# Patient Record
Sex: Female | Born: 1950 | Race: White | Hispanic: No | Marital: Married | Smoking: Former smoker
Health system: Southern US, Community
[De-identification: ages and names within clinical notes are randomized; demographics above are authoritative.]

## PROBLEM LIST (undated history)

## (undated) DIAGNOSIS — M797 Fibromyalgia: Secondary | ICD-10-CM

## (undated) DIAGNOSIS — E785 Hyperlipidemia, unspecified: Secondary | ICD-10-CM

## (undated) DIAGNOSIS — I10 Essential (primary) hypertension: Secondary | ICD-10-CM

## (undated) DIAGNOSIS — I493 Ventricular premature depolarization: Secondary | ICD-10-CM

## (undated) DIAGNOSIS — K219 Gastro-esophageal reflux disease without esophagitis: Secondary | ICD-10-CM

## (undated) DIAGNOSIS — T7840XA Allergy, unspecified, initial encounter: Secondary | ICD-10-CM

## (undated) HISTORY — DX: Allergy, unspecified, initial encounter: T78.40XA

## (undated) HISTORY — PX: ABDOMINAL HYSTERECTOMY: SHX81

## (undated) HISTORY — DX: Hyperlipidemia, unspecified: E78.5

## (undated) HISTORY — DX: Ventricular premature depolarization: I49.3

## (undated) HISTORY — DX: Fibromyalgia: M79.7

## (undated) HISTORY — DX: Essential (primary) hypertension: I10

## (undated) HISTORY — PX: CATARACT EXTRACTION, BILATERAL: SHX1313

## (undated) HISTORY — DX: Gastro-esophageal reflux disease without esophagitis: K21.9

---

## 1983-01-17 HISTORY — PX: APPENDECTOMY: SHX54

## 2004-02-26 ENCOUNTER — Ambulatory Visit: Payer: Self-pay | Admitting: Family Medicine

## 2004-07-12 ENCOUNTER — Ambulatory Visit: Payer: Self-pay | Admitting: Family Medicine

## 2004-07-29 ENCOUNTER — Ambulatory Visit: Payer: Self-pay | Admitting: Family Medicine

## 2005-01-05 ENCOUNTER — Ambulatory Visit: Payer: Self-pay | Admitting: Family Medicine

## 2005-12-15 ENCOUNTER — Ambulatory Visit: Payer: Self-pay | Admitting: Family Medicine

## 2006-03-16 ENCOUNTER — Ambulatory Visit: Payer: Self-pay | Admitting: Family Medicine

## 2006-05-04 ENCOUNTER — Ambulatory Visit: Payer: Self-pay | Admitting: Family Medicine

## 2008-01-17 HISTORY — PX: CHOLECYSTECTOMY: SHX55

## 2015-05-06 DIAGNOSIS — E538 Deficiency of other specified B group vitamins: Secondary | ICD-10-CM | POA: Insufficient documentation

## 2018-02-22 DIAGNOSIS — I10 Essential (primary) hypertension: Secondary | ICD-10-CM | POA: Insufficient documentation

## 2018-02-22 DIAGNOSIS — R079 Chest pain, unspecified: Secondary | ICD-10-CM | POA: Diagnosis not present

## 2018-02-22 DIAGNOSIS — E782 Mixed hyperlipidemia: Secondary | ICD-10-CM | POA: Diagnosis not present

## 2018-02-22 DIAGNOSIS — I493 Ventricular premature depolarization: Secondary | ICD-10-CM | POA: Diagnosis not present

## 2018-02-22 DIAGNOSIS — R002 Palpitations: Secondary | ICD-10-CM | POA: Diagnosis not present

## 2018-02-25 DIAGNOSIS — R002 Palpitations: Secondary | ICD-10-CM | POA: Diagnosis not present

## 2018-02-25 DIAGNOSIS — R079 Chest pain, unspecified: Secondary | ICD-10-CM | POA: Diagnosis not present

## 2018-03-01 ENCOUNTER — Encounter: Payer: Self-pay | Admitting: Physician Assistant

## 2018-03-01 ENCOUNTER — Telehealth: Payer: Self-pay | Admitting: Physician Assistant

## 2018-03-01 ENCOUNTER — Ambulatory Visit (INDEPENDENT_AMBULATORY_CARE_PROVIDER_SITE_OTHER): Payer: Medicare Other | Admitting: Physician Assistant

## 2018-03-01 VITALS — BP 130/83 | HR 71 | Temp 97.2°F | Ht 59.0 in | Wt 153.6 lb

## 2018-03-01 DIAGNOSIS — E782 Mixed hyperlipidemia: Secondary | ICD-10-CM

## 2018-03-01 DIAGNOSIS — E538 Deficiency of other specified B group vitamins: Secondary | ICD-10-CM | POA: Diagnosis not present

## 2018-03-01 DIAGNOSIS — I493 Ventricular premature depolarization: Secondary | ICD-10-CM

## 2018-03-01 DIAGNOSIS — Z0000 Encounter for general adult medical examination without abnormal findings: Secondary | ICD-10-CM

## 2018-03-01 DIAGNOSIS — I10 Essential (primary) hypertension: Secondary | ICD-10-CM | POA: Diagnosis not present

## 2018-03-01 DIAGNOSIS — F419 Anxiety disorder, unspecified: Secondary | ICD-10-CM

## 2018-03-01 DIAGNOSIS — T7840XS Allergy, unspecified, sequela: Secondary | ICD-10-CM

## 2018-03-01 DIAGNOSIS — T7840XA Allergy, unspecified, initial encounter: Secondary | ICD-10-CM | POA: Insufficient documentation

## 2018-03-01 DIAGNOSIS — K219 Gastro-esophageal reflux disease without esophagitis: Secondary | ICD-10-CM

## 2018-03-01 MED ORDER — MONTELUKAST SODIUM 10 MG PO TABS: 10.0000 mg | ORAL_TABLET | Freq: Every day | ORAL | 3 refills | Status: DC

## 2018-03-01 MED ORDER — OMEPRAZOLE 40 MG PO CPDR: 40.0000 mg | DELAYED_RELEASE_CAPSULE | Freq: Every day | ORAL | 3 refills | Status: DC

## 2018-03-01 MED ORDER — FLUTICASONE PROPIONATE 50 MCG/ACT NA SUSP: 2.0000 | Freq: Every day | NASAL | 3 refills | Status: DC

## 2018-03-01 MED ORDER — ATORVASTATIN CALCIUM 20 MG PO TABS: 20.0000 mg | ORAL_TABLET | Freq: Every day | ORAL | 3 refills | Status: DC

## 2018-03-01 MED ORDER — CETIRIZINE HCL 10 MG PO TABS: 10.0000 mg | ORAL_TABLET | Freq: Every day | ORAL | 3 refills | Status: DC

## 2018-03-01 MED ORDER — ESTRADIOL 0.5 MG PO TABS: 0.5000 mg | ORAL_TABLET | Freq: Every day | ORAL | 0 refills | Status: DC

## 2018-03-01 NOTE — Telephone Encounter (Signed)
PT has called back and said that her next apt with the cardiologist is on 03/20/2018 1:45 with Nicole Aguirre with [facility] in [facility].

## 2018-03-02 LAB — CMP14+EGFR
ALT: 11 IU/L (ref 0–32)
AST: 15 IU/L (ref 0–40)
Albumin/Globulin Ratio: 1.4 (ref 1.2–2.2)
Albumin: 4.2 g/dL (ref 3.8–4.8)
Alkaline Phosphatase: 62 IU/L (ref 39–117)
BUN/Creatinine Ratio: 14 (ref 12–28)
BUN: 12 mg/dL (ref 8–27)
Bilirubin Total: 0.3 mg/dL (ref 0.0–1.2)
CO2: 24 mmol/L (ref 20–29)
Calcium: 9.5 mg/dL (ref 8.7–10.3)
Chloride: 102 mmol/L (ref 96–106)
Creatinine, Ser: 0.83 mg/dL (ref 0.57–1.00)
GFR calc Af Amer: 85 mL/min/{1.73_m2} (ref >59)
GFR calc non Af Amer: 74 mL/min/{1.73_m2} (ref >59)
Globulin, Total: 3.1 g/dL (ref 1.5–4.5)
Glucose: 78 mg/dL (ref 65–99)
Potassium: 4.3 mmol/L (ref 3.5–5.2)
SODIUM: 140 mmol/L (ref 134–144)
Total Protein: 7.3 g/dL (ref 6.0–8.5)

## 2018-03-02 LAB — LIPID PANEL
Chol/HDL Ratio: 2.3 ratio (ref 0.0–4.4)
Cholesterol, Total: 185 mg/dL (ref 100–199)
HDL: 79 mg/dL (ref >39)
LDL Calculated: 96 mg/dL (ref 0–99)
TRIGLYCERIDES: 50 mg/dL (ref 0–149)
VLDL Cholesterol Cal: 10 mg/dL (ref 5–40)

## 2018-03-02 LAB — TSH: TSH: 2.02 u[IU]/mL (ref 0.450–4.500)

## 2018-03-03 DIAGNOSIS — I493 Ventricular premature depolarization: Secondary | ICD-10-CM | POA: Insufficient documentation

## 2018-03-03 NOTE — Progress Notes (Signed)
BP 130/83   Pulse 71   Temp (!) 97.2 F (36.2 C) (Oral)   Ht _0  (1.499 m)   Wt 153 lb 9.6 oz (69.7 kg)   BMI 31.02 kg/m    Subjective:    Patient ID: Nicole Aguirre, female    DOB: 09-26-1950, 68 y.o.   MRN: [id number]  HPI: Nicole Aguirre is a 68 y.o. female presenting on 03/01/2018 for New Patient (Initial Visit) and Establish Care  This patient comes in to be established at out practice and medications and conditions including hypertension, GERD, allergies, B12 deficiency, elevated cholesterol, anxiety.  All medications are reviewed and will be refilled. She has had some weak spells and has had low pulse rate. She does have an upcoming appointment with Dr. Carlis Abbott with [facility] in [place]. Recent testing has been good.  She does need up coming female exam, DEXA and mammogram.  All medications are reviewed today. There are no reports of any problems with the medications. All of the medical conditions are reviewed and updated.  Lab work is reviewed and will be ordered as medically necessary. There are no new problems reported with today's visit.   Past Medical History:  Diagnosis Date  . Allergy   . Fibromyalgia   . GERD (gastroesophageal reflux disease)   . Hyperlipidemia   . Hypertension   . PVC (premature ventricular contraction)    Relevant past medical, surgical, family and social history reviewed and updated as indicated. Interim medical history since our last visit reviewed. Allergies and medications reviewed and updated. DATA REVIEWED: CHART IN EPIC  Family History reviewed for pertinent findings.  Review of Systems  Constitutional: Negative.  Negative for activity change, fatigue and fever.  HENT: Positive for congestion.   Eyes: Negative.   Respiratory: Negative.  Negative for cough.   Cardiovascular: Positive for palpitations. Negative for chest pain.  Gastrointestinal: Negative.  Negative for abdominal pain.  Endocrine: Negative.   Genitourinary:  Negative.  Negative for dysuria.  Musculoskeletal: Negative.   Skin: Negative.   Neurological: Positive for weakness.    Allergies as of 03/01/2018   No Known Allergies     Medication List       Accurate as of March 01, 2018 11:59 PM. Always use your most recent med list.        amLODipine 5 MG tablet Commonly known as:  NORVASC Take by mouth.   atorvastatin 20 MG tablet Commonly known as:  LIPITOR Take 1 tablet (20 mg total) by mouth at bedtime.   cetirizine 10 MG tablet Commonly known as:  ZYRTEC Take 1 tablet (10 mg total) by mouth daily.   estradiol 0.5 MG tablet Commonly known as:  ESTRACE Take 1 tablet (0.5 mg total) by mouth daily.   fluticasone 50 MCG/ACT nasal spray Commonly known as:  FLONASE Place 2 sprays into both nostrils daily.   gabapentin 300 MG capsule Commonly known as:  NEURONTIN Take 300 mg by mouth 2 (two) times daily.   hydrochlorothiazide 25 MG tablet Commonly known as:  HYDRODIURIL 12.5 mg.   montelukast 10 MG tablet Commonly known as:  SINGULAIR Take 1 tablet (10 mg total) by mouth daily.   omeprazole 40 MG capsule Commonly known as:  PRILOSEC Take 1 capsule (40 mg total) by mouth daily.          Objective:    BP 130/83   Pulse 71   Temp (!) 97.2 F (36.2 C) (Oral)   Ht _1  (1.499  m)   Wt 153 lb 9.6 oz (69.7 kg)   BMI 31.02 kg/m   No Known Allergies  Wt Readings from Last 3 Encounters:  03/01/18 153 lb 9.6 oz (69.7 kg)    Physical Exam Constitutional:      Appearance: She is well-developed.  HENT:     Head: Normocephalic and atraumatic.  Eyes:     Conjunctiva/sclera: Conjunctivae normal.     Pupils: Pupils are equal, round, and reactive to light.  Cardiovascular:     Rate and Rhythm: Normal rate and regular rhythm.     Heart sounds: Normal heart sounds.  Pulmonary:     Effort: Pulmonary effort is normal.     Breath sounds: Normal breath sounds.  Abdominal:     General: Bowel sounds are normal.      Palpations: Abdomen is soft.  Skin:    General: Skin is warm and dry.     Findings: No rash.  Neurological:     Mental Status: She is alert and oriented to person, place, and time.     Deep Tendon Reflexes: Reflexes are normal and symmetric.  Psychiatric:        Behavior: Behavior normal.        Thought Content: Thought content normal.        Judgment: Judgment normal.     Results for orders placed or performed in visit on 03/01/18  CMP14+EGFR  Result Value Ref Range   Glucose 78 65 - 99 mg/dL   BUN 12 8 - 27 mg/dL   Creatinine, Ser 0.83 0.57 - 1.00 mg/dL   GFR calc non Af Amer 74 >59 mL/min/1.73   GFR calc Af Amer 85 >59 mL/min/1.73   BUN/Creatinine Ratio 14 12 - 28   Sodium 140 134 - 144 mmol/L   Potassium 4.3 3.5 - 5.2 mmol/L   Chloride 102 96 - 106 mmol/L   CO2 24 20 - 29 mmol/L   Calcium 9.5 8.7 - 10.3 mg/dL   Total Protein 7.3 6.0 - 8.5 g/dL   Albumin 4.2 3.8 - 4.8 g/dL   Globulin, Total 3.1 1.5 - 4.5 g/dL   Albumin/Globulin Ratio 1.4 1.2 - 2.2   Bilirubin Total 0.3 0.0 - 1.2 mg/dL   Alkaline Phosphatase 62 39 - 117 IU/L   AST 15 0 - 40 IU/L   ALT 11 0 - 32 IU/L  Lipid panel  Result Value Ref Range   Cholesterol, Total 185 100 - 199 mg/dL   Triglycerides 50 0 - 149 mg/dL   HDL 79 >39 mg/dL   VLDL Cholesterol Cal 10 5 - 40 mg/dL   LDL Calculated 96 0 - 99 mg/dL   Chol/HDL Ratio 2.3 0.0 - 4.4 ratio  TSH  Result Value Ref Range   TSH 2.020 0.450 - 4.500 uIU/mL      Assessment & Plan:   1. Well adult exam - CMP14+EGFR - Lipid panel - TSH  2. Essential hypertension - hydrochlorothiazide (HYDRODIURIL) 25 MG tablet; 12.5 mg. - amLODipine (NORVASC) 5 MG tablet; Take by mouth. - CMP14+EGFR - Lipid panel - TSH  3. Gastroesophageal reflux disease without esophagitis - omeprazole (PRILOSEC) 40 MG capsule; Take 1 capsule (40 mg total) by mouth daily.  Dispense: 90 capsule; Refill: 3  4. Mixed hyperlipidemia - atorvastatin (LIPITOR) 20 MG tablet; Take 1  tablet (20 mg total) by mouth at bedtime.  Dispense: 90 tablet; Refill: 3  5. B12 deficiency Continue medications  6. Anxiety Continue medications  7. Allergic  state, sequela - cetirizine (ZYRTEC) 10 MG tablet; Take 1 tablet (10 mg total) by mouth daily.  Dispense: 90 tablet; Refill: 3 - fluticasone (FLONASE) 50 MCG/ACT nasal spray; Place 2 sprays into both nostrils daily.  Dispense: 48 g; Refill: 3 - montelukast (SINGULAIR) 10 MG tablet; Take 1 tablet (10 mg total) by mouth daily.  Dispense: 90 tablet; Refill: 3   Continue all other maintenance medications as listed above.  Follow up plan: Return for CPE female exam in next few month.  Educational handout given for [place] PA-C [facility] [address]  [place], [place] [address] [phone]   03/03/2018, 10:56 PM

## 2018-03-04 ENCOUNTER — Other Ambulatory Visit: Payer: Self-pay | Admitting: Physician Assistant

## 2018-03-04 DIAGNOSIS — I10 Essential (primary) hypertension: Secondary | ICD-10-CM

## 2018-03-04 DIAGNOSIS — K219 Gastro-esophageal reflux disease without esophagitis: Secondary | ICD-10-CM

## 2018-03-04 DIAGNOSIS — E782 Mixed hyperlipidemia: Secondary | ICD-10-CM

## 2018-03-04 DIAGNOSIS — T7840XS Allergy, unspecified, sequela: Secondary | ICD-10-CM

## 2018-03-04 MED ORDER — AMLODIPINE BESYLATE 5 MG PO TABS: 5.0000 mg | ORAL_TABLET | Freq: Every day | ORAL | 3 refills | Status: DC

## 2018-03-04 MED ORDER — GABAPENTIN 300 MG PO CAPS: 300.0000 mg | ORAL_CAPSULE | Freq: Two times a day (BID) | ORAL | 3 refills | Status: DC

## 2018-03-04 MED ORDER — HYDROCHLOROTHIAZIDE 25 MG PO TABS: 12.5000 mg | ORAL_TABLET | Freq: Every day | ORAL | 3 refills | Status: DC

## 2018-03-04 MED ORDER — FLUTICASONE PROPIONATE 50 MCG/ACT NA SUSP: 2.0000 | Freq: Every day | NASAL | 3 refills | Status: DC

## 2018-03-04 MED ORDER — OMEPRAZOLE 40 MG PO CPDR: 40.0000 mg | DELAYED_RELEASE_CAPSULE | Freq: Every day | ORAL | 3 refills | Status: DC

## 2018-03-04 MED ORDER — CETIRIZINE HCL 10 MG PO TABS: 10.0000 mg | ORAL_TABLET | Freq: Every day | ORAL | 3 refills | Status: DC

## 2018-03-04 MED ORDER — ESTRADIOL 0.5 MG PO TABS: 0.5000 mg | ORAL_TABLET | Freq: Every day | ORAL | 3 refills | Status: DC

## 2018-03-04 MED ORDER — MONTELUKAST SODIUM 10 MG PO TABS: 10.0000 mg | ORAL_TABLET | Freq: Every day | ORAL | 3 refills | Status: DC

## 2018-03-04 MED ORDER — ATORVASTATIN CALCIUM 20 MG PO TABS: 20.0000 mg | ORAL_TABLET | Freq: Every day | ORAL | 3 refills | Status: DC

## 2018-03-04 NOTE — Telephone Encounter (Signed)
Pt aware - re-sent to mail

## 2018-04-02 ENCOUNTER — Ambulatory Visit (INDEPENDENT_AMBULATORY_CARE_PROVIDER_SITE_OTHER): Payer: Medicare Other | Admitting: Family

## 2018-04-02 ENCOUNTER — Telehealth: Payer: Self-pay | Admitting: Physician Assistant

## 2018-04-02 ENCOUNTER — Encounter: Payer: Self-pay | Admitting: Family

## 2018-04-02 ENCOUNTER — Other Ambulatory Visit: Payer: Self-pay

## 2018-04-02 VITALS — BP 130/82 | HR 73 | Temp 97.2°F | Ht 59.0 in | Wt 156.2 lb

## 2018-04-02 DIAGNOSIS — J0100 Acute maxillary sinusitis, unspecified: Secondary | ICD-10-CM | POA: Diagnosis not present

## 2018-04-02 MED ORDER — AMOXICILLIN-POT CLAVULANATE 875-125 MG PO TABS: 1.0000 | ORAL_TABLET | Freq: Two times a day (BID) | ORAL | 0 refills | Status: DC

## 2018-04-02 MED ORDER — BENZONATATE 200 MG PO CAPS: 200.0000 mg | ORAL_CAPSULE | Freq: Three times a day (TID) | ORAL | 1 refills | Status: DC | PRN

## 2018-04-02 NOTE — Telephone Encounter (Signed)
I am sorry. I have sent this in to the [facility].

## 2018-04-02 NOTE — Telephone Encounter (Signed)
Aware. 

## 2018-04-02 NOTE — Progress Notes (Signed)
Subjective:    Patient ID: Nicole Aguirre, female    DOB: 1950-10-16, 68 y.o.   MRN: [id number]  Chief Complaint  Patient presents with  . sinus pressure in face with plugged nose    Sinusitis  The current episode started 1 to 4 weeks ago. The problem has been gradually worsening since onset. There has been no fever. Her pain is at a severity of 6/10. The pain is mild. Associated symptoms include chills, congestion, coughing, ear pain, headaches, sinus pressure, sneezing and a sore throat. Pertinent negatives include no shortness of breath. Past treatments include oral decongestants and lying down. The treatment provided mild relief.      Review of Systems  Constitutional: Positive for chills.  HENT: Positive for congestion, ear pain, sinus pressure, sneezing and sore throat.   Respiratory: Positive for cough. Negative for shortness of breath.   Neurological: Positive for headaches.  All other systems reviewed and are negative.      Objective:   Physical Exam Vitals signs reviewed.  Constitutional:      General: She is not in acute distress.    Appearance: She is well-developed.  HENT:     Head: Normocephalic and atraumatic.     Nose: Mucosal edema and rhinorrhea present.     Right Sinus: Maxillary sinus tenderness present.     Left Sinus: Maxillary sinus tenderness present.     Mouth/Throat:     Pharynx: Posterior oropharyngeal erythema present.  Eyes:     Pupils: Pupils are equal, round, and reactive to light.  Neck:     Musculoskeletal: Normal range of motion and neck supple.     Thyroid: No thyromegaly.  Cardiovascular:     Rate and Rhythm: Normal rate and regular rhythm.     Heart sounds: Normal heart sounds. No murmur.  Pulmonary:     Effort: Pulmonary effort is normal. No respiratory distress.     Breath sounds: Normal breath sounds. No wheezing.  Abdominal:     General: Bowel sounds are normal. There is no distension.     Palpations: Abdomen is soft.      Tenderness: There is no abdominal tenderness.  Musculoskeletal: Normal range of motion.        General: No tenderness.  Skin:    General: Skin is warm and dry.  Neurological:     Mental Status: She is alert and oriented to person, place, and time.     Cranial Nerves: No cranial nerve deficit.     Deep Tendon Reflexes: Reflexes are normal and symmetric.  Psychiatric:        Behavior: Behavior normal.        Thought Content: Thought content normal.        Judgment: Judgment normal.       BP 130/82   Pulse 73   Temp (!) 97.2 F (36.2 C) (Oral)   Ht 4\' 11"  (1.499 m)   Wt 156 lb 3.2 oz (70.9 kg)   BMI 31.55 kg/m      Assessment & Plan:  Nicole Aguirre comes in today with chief complaint of sinus pressure in face with plugged nose   Diagnosis and orders addressed:  1. Acute maxillary sinusitis, recurrence not specified - Take meds as prescribed - Use a cool mist humidifier  -Use saline nose sprays frequently -Force fluids -For any cough or congestion  Use plain Mucinex- regular strength or max strength is fine -For fever or aces or pains- take tylenol or ibuprofen. -Throat lozenges  if help -New toothbrush in 3 days RTO if symptoms worsen or do not improve  - amoxicillin-clavulanate (AUGMENTIN) 875-125 MG tablet; Take 1 tablet by mouth 2 (two) times daily.  Dispense: 14 tablet; Refill: 0   Evelina Dun, FNP

## 2018-04-02 NOTE — Patient Instructions (Signed)
Sinusitis, Adult  Sinusitis is inflammation of your sinuses. Sinuses are hollow spaces in the bones around your face. Your sinuses are located:   Around your eyes.   In the middle of your forehead.   Behind your nose.   In your cheekbones.  Mucus normally drains out of your sinuses. When your nasal tissues become inflamed or swollen, mucus can become trapped or blocked. This allows bacteria, viruses, and fungi to grow, which leads to infection. Most infections of the sinuses are caused by a virus.  Sinusitis can develop quickly. It can last for up to 4 weeks (acute) or for more than 12 weeks (chronic). Sinusitis often develops after a cold.  What are the causes?  This condition is caused by anything that creates swelling in the sinuses or stops mucus from draining. This includes:   Allergies.   Asthma.   Infection from bacteria or viruses.   Deformities or blockages in your nose or sinuses.   Abnormal growths in the nose (nasal polyps).   Pollutants, such as chemicals or irritants in the air.   Infection from fungi (rare).  What increases the risk?  You are more likely to develop this condition if you:   Have a weak body defense system (immune system).   Do a lot of swimming or diving.   Overuse nasal sprays.   Smoke.  What are the signs or symptoms?  The main symptoms of this condition are pain and a feeling of pressure around the affected sinuses. Other symptoms include:   Stuffy nose or congestion.   Thick drainage from your nose.   Swelling and warmth over the affected sinuses.   Headache.   Upper toothache.   A cough that may get worse at night.   Extra mucus that collects in the throat or the back of the nose (postnasal drip).   Decreased sense of smell and taste.   Fatigue.   A fever.   Sore throat.   Bad breath.  How is this diagnosed?  This condition is diagnosed based on:   Your symptoms.   Your medical history.   A physical exam.   Tests to find out if your condition is  acute or chronic. This may include:  ? Checking your nose for nasal polyps.  ? Viewing your sinuses using a device that has a light (endoscope).  ? Testing for allergies or bacteria.  ? Imaging tests, such as an MRI or CT scan.  In rare cases, a bone biopsy may be done to rule out more serious types of fungal sinus disease.  How is this treated?  Treatment for sinusitis depends on the cause and whether your condition is chronic or acute.   If caused by a virus, your symptoms should go away on their own within 10 days. You may be given medicines to relieve symptoms. They include:  ? Medicines that shrink swollen nasal passages (topical intranasal decongestants).  ? Medicines that treat allergies (antihistamines).  ? A spray that eases inflammation of the nostrils (topical intranasal corticosteroids).  ? Rinses that help get rid of thick mucus in your nose (nasal saline washes).   If caused by bacteria, your health care provider may recommend waiting to see if your symptoms improve. Most bacterial infections will get better without antibiotic medicine. You may be given antibiotics if you have:  ? A severe infection.  ? A weak immune system.   If caused by narrow nasal passages or nasal polyps, you may need   to have surgery.  Follow these instructions at home:  Medicines   Take, use, or apply over-the-counter and prescription medicines only as told by your health care provider. These may include nasal sprays.   If you were prescribed an antibiotic medicine, take it as told by your health care provider. Do not stop taking the antibiotic even if you start to feel better.  Hydrate and humidify     Drink enough fluid to keep your urine pale yellow. Staying hydrated will help to thin your mucus.   Use a cool mist humidifier to keep the humidity level in your home above 50%.   Inhale steam for 10-15 minutes, 3-4 times a day, or as told by your health care provider. You can do this in the bathroom while a hot shower is  running.   Limit your exposure to cool or dry air.  Rest   Rest as much as possible.   Sleep with your head raised (elevated).   Make sure you get enough sleep each night.  General instructions     Apply a warm, moist washcloth to your face 3-4 times a day or as told by your health care provider. This will help with discomfort.   Wash your hands often with soap and water to reduce your exposure to germs. If soap and water are not available, use hand sanitizer.   Do not smoke. Avoid being around people who are smoking (secondhand smoke).   Keep all follow-up visits as told by your health care provider. This is important.  Contact a health care provider if:   You have a fever.   Your symptoms get worse.   Your symptoms do not improve within 10 days.  Get help right away if:   You have a severe headache.   You have persistent vomiting.   You have severe pain or swelling around your face or eyes.   You have vision problems.   You develop confusion.   Your neck is stiff.   You have trouble breathing.  Summary   Sinusitis is soreness and inflammation of your sinuses. Sinuses are hollow spaces in the bones around your face.   This condition is caused by nasal tissues that become inflamed or swollen. The swelling traps or blocks the flow of mucus. This allows bacteria, viruses, and fungi to grow, which leads to infection.   If you were prescribed an antibiotic medicine, take it as told by your health care provider. Do not stop taking the antibiotic even if you start to feel better.   Keep all follow-up visits as told by your health care provider. This is important.  This information is not intended to replace advice given to you by your health care provider. Make sure you discuss any questions you have with your health care provider.  Document Released: 01/02/2005 Document Revised: 06/04/2017 Document Reviewed: 06/04/2017  Elsevier Interactive Patient Education  2019 [employer]

## 2018-04-12 ENCOUNTER — Other Ambulatory Visit: Payer: Self-pay | Admitting: Physician Assistant

## 2018-04-12 ENCOUNTER — Telehealth: Payer: Self-pay | Admitting: Physician Assistant

## 2018-04-12 MED ORDER — CEFDINIR 300 MG PO CAPS: 300.0000 mg | ORAL_CAPSULE | Freq: Two times a day (BID) | ORAL | 0 refills | Status: DC

## 2018-04-12 NOTE — Telephone Encounter (Signed)
Cefdinir 300 mg 1 twice daily sent to her pharmacy.

## 2018-04-12 NOTE — Telephone Encounter (Signed)
Patient aware.

## 2018-04-12 NOTE — Telephone Encounter (Signed)
PT finished her last antibiotic around first of the week and the mucus she is getting up is now thick and white, cough, upset stomach, denies fever. Wants to know if something else can be sent in   Pharmacy: [facility]

## 2018-04-25 DIAGNOSIS — R002 Palpitations: Secondary | ICD-10-CM | POA: Diagnosis not present

## 2018-04-30 ENCOUNTER — Other Ambulatory Visit: Payer: Self-pay

## 2018-04-30 ENCOUNTER — Encounter: Payer: Medicare Other | Admitting: Physician Assistant

## 2018-05-01 DIAGNOSIS — R002 Palpitations: Secondary | ICD-10-CM | POA: Diagnosis not present

## 2018-05-03 DIAGNOSIS — R002 Palpitations: Secondary | ICD-10-CM | POA: Diagnosis not present

## 2018-05-27 ENCOUNTER — Telehealth: Payer: Self-pay | Admitting: Physician Assistant

## 2018-05-27 NOTE — Telephone Encounter (Signed)
Seeing cardio at [facility]= she has been having test done and they rx'd the amlodipine. - I told pt to call them and see what they thought about her meds.

## 2018-05-29 ENCOUNTER — Telehealth: Payer: Self-pay | Admitting: Physician Assistant

## 2018-06-05 ENCOUNTER — Encounter: Payer: Medicare Other | Admitting: Physician Assistant

## 2018-06-21 ENCOUNTER — Ambulatory Visit: Payer: Medicare Other

## 2018-06-21 ENCOUNTER — Other Ambulatory Visit: Payer: Self-pay

## 2018-06-21 DIAGNOSIS — E782 Mixed hyperlipidemia: Secondary | ICD-10-CM | POA: Diagnosis not present

## 2018-06-21 DIAGNOSIS — I493 Ventricular premature depolarization: Secondary | ICD-10-CM | POA: Diagnosis not present

## 2018-06-21 DIAGNOSIS — I10 Essential (primary) hypertension: Secondary | ICD-10-CM | POA: Diagnosis not present

## 2018-07-23 DIAGNOSIS — S59912A Unspecified injury of left forearm, initial encounter: Secondary | ICD-10-CM | POA: Diagnosis not present

## 2018-07-25 ENCOUNTER — Other Ambulatory Visit: Payer: Self-pay

## 2018-07-26 ENCOUNTER — Ambulatory Visit (INDEPENDENT_AMBULATORY_CARE_PROVIDER_SITE_OTHER): Payer: Medicare Other | Admitting: Physician Assistant

## 2018-07-26 ENCOUNTER — Encounter: Payer: Self-pay | Admitting: *Deleted

## 2018-07-26 ENCOUNTER — Encounter: Payer: Self-pay | Admitting: Physician Assistant

## 2018-07-26 ENCOUNTER — Ambulatory Visit (INDEPENDENT_AMBULATORY_CARE_PROVIDER_SITE_OTHER): Payer: Medicare Other | Admitting: *Deleted

## 2018-07-26 VITALS — BP 142/87 | HR 63 | Temp 98.6°F | Ht 59.0 in | Wt 156.0 lb

## 2018-07-26 DIAGNOSIS — R5382 Chronic fatigue, unspecified: Secondary | ICD-10-CM | POA: Diagnosis not present

## 2018-07-26 DIAGNOSIS — Z0000 Encounter for general adult medical examination without abnormal findings: Secondary | ICD-10-CM

## 2018-07-26 DIAGNOSIS — K589 Irritable bowel syndrome without diarrhea: Secondary | ICD-10-CM

## 2018-07-26 DIAGNOSIS — I10 Essential (primary) hypertension: Secondary | ICD-10-CM

## 2018-07-26 MED ORDER — AMLODIPINE BESYLATE 2.5 MG PO TABS: 5.0000 mg | ORAL_TABLET | Freq: Every day | ORAL | 3 refills | Status: DC

## 2018-07-26 MED ORDER — HYOSCYAMINE SULFATE 0.125 MG PO TABS: 0.1250 mg | ORAL_TABLET | ORAL | 5 refills | Status: DC | PRN

## 2018-07-26 NOTE — Progress Notes (Signed)
MEDICARE ANNUAL WELLNESS VISIT  07/26/2018  Telephone Visit Disclaimer This Medicare AWV was conducted by telephone due to national recommendations for restrictions regarding the COVID-19 Pandemic (e.g. social distancing).  I verified, using two identifiers, that I am speaking with [name] or their authorized healthcare agent. I discussed the limitations, risks, security, and privacy concerns of performing an evaluation and management service by telephone and the potential availability of an in-person appointment in the future. The patient expressed understanding and agreed to proceed.   Subjective:  Suetta Hoffmeister is a 68 y.o. female patient of Terald Sleeper, PA-C who had a Medicare Annual Wellness Visit today via telephone. [place] is Working part time and lives with their spouse. she has 2 children. she reports that she is socially active and does interact with friends/family regularly. she is minimally physically active and enjoys going to [place], [employer] and caring for her grandchildren.  Patient Care Team: Theodoro Clock as PCP - General (Physician Assistant)  Advanced Directives 07/26/2018  Does Patient Have a Medical Advance Directive? No  Would patient like information on creating a medical advance directive? No - Patient declined    Hospital Utilization Over the Past 12 Months: # of hospitalizations or ER visits: 1 # of surgeries: 0  Review of Systems    Patient reports that her overall health is better compared to last year.  Patient Reported Readings (BP, Pulse, CBG, Weight, etc) none  Review of Systems: No complaints.  All other systems negative.  Pain Assessment Pain : No/denies pain     Current Medications & Allergies (verified) Allergies as of 07/26/2018   No Known Allergies     Medication List       Accurate as of July 26, 2018  3:22 PM. If you have any questions, ask your nurse or doctor.        amLODipine 2.5  MG tablet Commonly known as: NORVASC Take 2 tablets (5 mg total) by mouth daily. What changed: medication strength Changed by: Terald Sleeper, PA-C   atorvastatin 20 MG tablet Commonly known as: LIPITOR Take 1 tablet (20 mg total) by mouth at bedtime.   cetirizine 10 MG tablet Commonly known as: ZYRTEC Take 1 tablet (10 mg total) by mouth daily.   diltiazem 30 MG tablet Commonly known as: CARDIZEM 30 mg. TID as needed for palpitations   estradiol 0.5 MG tablet Commonly known as: ESTRACE Take 1 tablet (0.5 mg total) by mouth daily.   fluticasone 50 MCG/ACT nasal spray Commonly known as: FLONASE Place 2 sprays into both nostrils daily.   gabapentin 300 MG capsule Commonly known as: NEURONTIN Take 1 capsule (300 mg total) by mouth 2 (two) times daily.   hydrochlorothiazide 25 MG tablet Commonly known as: HYDRODIURIL Take 0.5 tablets (12.5 mg total) by mouth daily.   hyoscyamine 0.125 MG tablet Commonly known as: LEVSIN Take 1 tablet (0.125 mg total) by mouth every 4 (four) hours as needed for cramping. Started by: Terald Sleeper, PA-C   montelukast 10 MG tablet Commonly known as: SINGULAIR Take 1 tablet (10 mg total) by mouth daily.   omeprazole 40 MG capsule Commonly known as: PRILOSEC Take 1 capsule (40 mg total) by mouth daily.       History (reviewed): Past Medical History:  Diagnosis Date  . Allergy   . Fibromyalgia   . GERD (gastroesophageal reflux disease)   . Hyperlipidemia   . Hypertension   . PVC (premature ventricular contraction)  Past Surgical History:  Procedure Laterality Date  . ABDOMINAL HYSTERECTOMY    . APPENDECTOMY  1985  . CATARACT EXTRACTION, BILATERAL    . CHOLECYSTECTOMY  2010   Family History  Problem Relation Age of Onset  . Lupus Mother   . Rheum arthritis Mother   . Cancer Mother        LUNG  . Emphysema Father   . Cancer Father        pANCREATIC   Social History   Socioeconomic History  . Marital status: Married     Spouse name: [name]  . Number of children: 2  . Years of education: 40  . Highest education level: GED or equivalent  Occupational History  . Occupation: Dietary    Comment: [employer]  Social Needs  . Financial resource strain: Not hard at all  . Food insecurity    Worry: Never true    Inability: Never true  . Transportation needs    Medical: No    Non-medical: No  Tobacco Use  . Smoking status: Former Smoker    Packs/day: 1.00    Years: 4.00    Pack years: 4.00    Quit date: 07/25/1976    Years since quitting: 42.0  . Smokeless tobacco: Never Used  Substance and Sexual Activity  . Alcohol use: Never    Frequency: Never  . Drug use: Never  . Sexual activity: Yes    Birth control/protection: Surgical  Lifestyle  . Physical activity    Days per week: 4 days    Minutes per session: 70 min  . Stress: Not at all  Relationships  . Social Herbalist on phone: Twice a week    Gets together: Twice a week    Attends religious service: More than 4 times per year    Active member of club or organization: Yes    Attends meetings of clubs or organizations: More than 4 times per year    Relationship status: Married  Other Topics Concern  . Not on file  Social History Narrative  . Not on file    Activities of Daily Living In your present state of health, do you have any difficulty performing the following activities: 07/26/2018  Hearing? Y  Comment sometimes she has to have people repeat what they said  Vision? Y  Comment pt hasn't had an eye exam in about 5 years so her prescription lenses need to be changed  Difficulty concentrating or making decisions? N  Walking or climbing stairs? Y  Comment due to her fibromyalgia  Dressing or bathing? N  Doing errands, shopping? N  Preparing Food and eating ? N  Using the Toilet? N  In the past six months, have you accidently leaked urine? N  Do you have problems with loss of bowel control? N  Managing your  Medications? N  Managing your Finances? N  Housekeeping or managing your Housekeeping? N  Some recent data might be hidden    Patient Literacy How often do you need to have someone help you when you read instructions, pamphlets, or other written materials from your doctor or pharmacy?: 1 - Never What is the last grade level you completed in school?: 10th grade-GED  Exercise Current Exercise Habits: The patient has a physically strenuous job, but has no regular exercise apart from work., Exercise limited by: Other - see comments(fibromyalgia)  Diet Patient reports consuming 2 meals a day and 1 snack(s) a day Patient reports that  her primary diet is: Regular Patient reports that she does have regular access to food.   Depression Screen PHQ 2/9 Scores 07/26/2018 07/26/2018 04/02/2018 03/01/2018  PHQ - 2 Score 0 0 0 0     Fall Risk Fall Risk  07/26/2018 07/26/2018 04/02/2018 03/01/2018  Falls in the past year? 0 0 0 0     Objective:  [name] seemed alert and oriented and she participated appropriately during our telephone visit.  Blood Pressure Weight BMI  BP Readings from Last 3 Encounters:  07/26/18 (!) 142/87  04/02/18 130/82  03/01/18 130/83   Wt Readings from Last 3 Encounters:  07/26/18 156 lb (70.8 kg)  04/02/18 156 lb 3.2 oz (70.9 kg)  03/01/18 153 lb 9.6 oz (69.7 kg)   BMI Readings from Last 1 Encounters:  07/26/18 31.51 kg/m    *Unable to obtain current vital signs, weight, and BMI due to telephone visit type  Hearing/Vision  . [place] did not seem to have difficulty with hearing/understanding during the telephone conversation . Reports that she has not had a formal eye exam by an eye care professional within the past year . Reports that she has not had a formal hearing evaluation within the past year *Unable to fully assess hearing and vision during telephone visit type  Cognitive Function: 6CIT Screen 07/26/2018  What Year? 0 points  What month? 0  points  What time? 0 points  Count back from 20 0 points  Months in reverse 2 points  Repeat phrase 0 points  Total Score 2   (Normal:0-7, Significant for Dysfunction: >8)  Normal Cognitive Function Screening: Yes   Immunization & Health Maintenance Record Immunization History  Administered Date(s) Administered  . Influenza, High Dose Seasonal PF 10/09/2017    Health Maintenance  Topic Date Due  . Hepatitis C Screening  1950-10-24  . TETANUS/TDAP  11/17/1970  . MAMMOGRAM  11/16/2001  . COLONOSCOPY  11/16/2001  . DEXA SCAN  11/16/2016  . PNA vac Low Risk Adult (1 of 2 - PCV13) 11/16/2016  . INFLUENZA VACCINE  08/17/2018       Assessment  This is a routine wellness examination for [facility].  Health Maintenance: Due or Overdue Health Maintenance Due  Topic Date Due  . Hepatitis C Screening  [birth year]  . TETANUS/TDAP  11/17/1970  . MAMMOGRAM  11/16/2001  . COLONOSCOPY  11/16/2001  . DEXA SCAN  11/16/2016  . PNA vac Low Risk Adult (1 of 2 - PCV13) 11/16/2016    [name] does not need a referral for [employer] Assistance: Care Management:   no Social Work:    no Prescription Assistance:  no Nutrition/Diabetes Education:  no   Plan:  Personalized Goals Goals Addressed            This Visit's Progress   . DIET - INCREASE WATER INTAKE       Try to drink 6-8 glasses of water daily.      Personalized Health Maintenance & Screening Recommendations  Pneumococcal vaccine  Screening mammography Bone densitometry screening Shingles vaccine  Lung Cancer Screening Recommended: no (Low Dose CT Chest recommended if Age 43-80 years, 30 pack-year currently smoking OR have quit w/in past 15 years) Hepatitis C Screening recommended: yes HIV Screening recommended: no  Advanced Directives: Written information was not prepared per patient's request.  Referrals & Orders No orders of the defined types were placed in this encounter.   Follow-up  Plan . Follow-up with Terald Sleeper, PA-C as planned .  Schedule your Screening Mammogram and DEXA scan . Consider Shingles and Pneumonia vaccines at your next visit with your PCP   I have personally reviewed and noted the following in the patient's chart:   . Medical and social history . Use of alcohol, tobacco or illicit drugs  . Current medications and supplements . Functional ability and status . Nutritional status . Physical activity . Advanced directives . List of other physicians . Hospitalizations, surgeries, and ER visits in previous 12 months . Vitals . Screenings to include cognitive, depression, and falls . Referrals and appointments  In addition, I have reviewed and discussed with [name] certain preventive protocols, quality metrics, and best practice recommendations. A written personalized care plan for preventive services as well as general preventive health recommendations is available and can be mailed to the patient at her request.      Marylin Crosby, LPN  [id number]

## 2018-07-26 NOTE — Patient Instructions (Signed)
Preventive Care 38 Years and Older, Female Preventive care refers to lifestyle choices and visits with your health care provider that can promote health and wellness. This includes:  A yearly physical exam. This is also called an annual well check.  Regular dental and eye exams.  Immunizations.  Screening for certain conditions.  Healthy lifestyle choices, such as diet and exercise. What can I expect for my preventive care visit? Physical exam Your health care provider will check:  Height and weight. These may be used to calculate body mass index (BMI), which is a measurement that tells if you are at a healthy weight.  Heart rate and blood pressure.  Your skin for abnormal spots. Counseling Your health care provider may ask you questions about:  Alcohol, tobacco, and drug use.  Emotional well-being.  Home and relationship well-being.  Sexual activity.  Eating habits.  History of falls.  Memory and ability to understand (cognition).  Work and work Statistician.  Pregnancy and menstrual history. What immunizations do I need?  Influenza (flu) vaccine  This is recommended every year. Tetanus, diphtheria, and pertussis (Tdap) vaccine  You may need a Td booster every 10 years. Varicella (chickenpox) vaccine  You may need this vaccine if you have not already been vaccinated. Zoster (shingles) vaccine  You may need this after age 67. Pneumococcal conjugate (PCV13) vaccine  One dose is recommended after age 67. Pneumococcal polysaccharide (PPSV23) vaccine  One dose is recommended after age 67. Measles, mumps, and rubella (MMR) vaccine  You may need at least one dose of MMR if you were born in 1957 or later. You may also need a second dose. Meningococcal conjugate (MenACWY) vaccine  You may need this if you have certain conditions. Hepatitis A vaccine  You may need this if you have certain conditions or if you travel or work in places where you may be exposed  to hepatitis A. Hepatitis B vaccine  You may need this if you have certain conditions or if you travel or work in places where you may be exposed to hepatitis B. Haemophilus influenzae type b (Hib) vaccine  You may need this if you have certain conditions. You may receive vaccines as individual doses or as more than one vaccine together in one shot (combination vaccines). Talk with your health care provider about the risks and benefits of combination vaccines. What tests do I need? Blood tests  Lipid and cholesterol levels. These may be checked every 5 years, or more frequently depending on your overall health.  Hepatitis C test.  Hepatitis B test. Screening  Lung cancer screening. You may have this screening every year starting at age 67 if you have a 30-pack-year history of smoking and currently smoke or have quit within the past 15 years.  Colorectal cancer screening. All adults should have this screening starting at age 67 and continuing until age 15. Your health care provider may recommend screening at age 23 if you are at increased risk.67 You will have tests every 1-10 years, depending on your results and the type of screening test.  Diabetes screening. This is done by checking your blood sugar (glucose) after you have not eaten for a while (fasting). You may have this done every 1-3 years.  Mammogram. This may be done every 1-2 years. Talk with your health care provider about how often you should have regular mammograms.  BRCA-related cancer screening. This may be done if you have a family history of breast, ovarian, tubal, or peritoneal cancers.  Other tests  Sexually transmitted disease (STD) testing.  Bone density scan. This is done to screen for osteoporosis. You may have this done starting at age 67. Follow these instructions at home: Eating and drinking  Eat a diet that includes fresh fruits and vegetables, whole grains, lean protein, and low-fat dairy products. Limit  your intake of foods with high amounts of sugar, saturated fats, and salt.  Take vitamin and mineral supplements as recommended by your health care provider.  Do not drink alcohol if your health care provider tells you not to drink.  If you drink alcohol: ? Limit how much you have to 0-1 drink a day. ? Be aware of how much alcohol is in your drink. In the [place] one drink equals one 12 oz bottle of beer (355 mL), one 5 oz glass of wine (148 mL), or one 1 oz glass of hard liquor (44 mL). Lifestyle  Take daily care of your teeth and gums.  Stay active. Exercise for at least 30 minutes on 5 or more days each week.  Do not use any products that contain nicotine or tobacco, such as cigarettes, e-cigarettes, and chewing tobacco. If you need help quitting, ask your health care provider.  If you are sexually active, practice safe sex. Use a condom or other form of protection in order to prevent STIs (sexually transmitted infections).  Talk with your health care provider about taking a low-dose aspirin or statin. What's next?  Go to your health care provider once a year for a well check visit.  Ask your health care provider how often you should have your eyes and teeth checked.  Stay up to date on all vaccines. This information is not intended to replace advice given to you by your health care provider. Make sure you discuss any questions you have with your health care provider. Document Released: 01/29/2015 Document Revised: 12/27/2017 Document Reviewed: 12/27/2017 Elsevier Patient Education  2020 [employer].

## 2018-07-27 LAB — CBC WITH DIFFERENTIAL/PLATELET
Basophils Absolute: 0 10*3/uL (ref 0.0–0.2)
Basos: 1 %
EOS (ABSOLUTE): 0.2 10*3/uL (ref 0.0–0.4)
Eos: 4 %
Hematocrit: 38.7 % (ref 34.0–46.6)
Hemoglobin: 12.8 g/dL (ref 11.1–15.9)
Immature Grans (Abs): 0 10*3/uL (ref 0.0–0.1)
Immature Granulocytes: 0 %
Lymphocytes Absolute: 1.2 10*3/uL (ref 0.7–3.1)
Lymphs: 29 %
MCH: 29.8 pg (ref 26.6–33.0)
MCHC: 33.1 g/dL (ref 31.5–35.7)
MCV: 90 fL (ref 79–97)
Monocytes Absolute: 0.3 10*3/uL (ref 0.1–0.9)
Monocytes: 8 %
Neutrophils Absolute: 2.4 10*3/uL (ref 1.4–7.0)
Neutrophils: 58 %
Platelets: 235 10*3/uL (ref 150–450)
RBC: 4.3 x10E6/uL (ref 3.77–5.28)
RDW: 13.2 % (ref 11.7–15.4)
WBC: 4.1 10*3/uL (ref 3.4–10.8)

## 2018-07-27 LAB — CMP14+EGFR
ALT: 16 IU/L (ref 0–32)
AST: 23 IU/L (ref 0–40)
Albumin/Globulin Ratio: 1.3 (ref 1.2–2.2)
Albumin: 4.1 g/dL (ref 3.8–4.8)
Alkaline Phosphatase: 56 IU/L (ref 39–117)
BUN/Creatinine Ratio: 10 — ABNORMAL LOW (ref 12–28)
BUN: 7 mg/dL — ABNORMAL LOW (ref 8–27)
Bilirubin Total: 0.4 mg/dL (ref 0.0–1.2)
CO2: 22 mmol/L (ref 20–29)
Calcium: 9.1 mg/dL (ref 8.7–10.3)
Chloride: 104 mmol/L (ref 96–106)
Creatinine, Ser: 0.73 mg/dL (ref 0.57–1.00)
GFR calc Af Amer: 99 mL/min/{1.73_m2} (ref >59)
GFR calc non Af Amer: 86 mL/min/{1.73_m2} (ref >59)
Globulin, Total: 3.1 g/dL (ref 1.5–4.5)
Glucose: 81 mg/dL (ref 65–99)
Potassium: 4 mmol/L (ref 3.5–5.2)
Sodium: 142 mmol/L (ref 134–144)
Total Protein: 7.2 g/dL (ref 6.0–8.5)

## 2018-07-27 LAB — TSH: TSH: 1.86 u[IU]/mL (ref 0.450–4.500)

## 2018-07-27 LAB — VITAMIN B12: Vitamin B-12: 314 pg/mL (ref 232–1245)

## 2018-07-29 ENCOUNTER — Ambulatory Visit (INDEPENDENT_AMBULATORY_CARE_PROVIDER_SITE_OTHER): Payer: Medicare Other | Admitting: Family Medicine

## 2018-07-29 ENCOUNTER — Other Ambulatory Visit: Payer: Self-pay

## 2018-07-29 VITALS — BP 134/90 | HR 76 | Temp 97.7°F | Ht 59.0 in | Wt 154.0 lb

## 2018-07-29 DIAGNOSIS — S40012D Contusion of left shoulder, subsequent encounter: Secondary | ICD-10-CM
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
## 2018-07-29 NOTE — Progress Notes (Signed)
BP (!) 142/87   Pulse 63   Temp 98.6 F (37 C) (Oral)   Ht 4' 11" (1.499 m)   Wt 156 lb (70.8 kg)   BMI 31.51 kg/m    Subjective:    Patient ID: Nicole Aguirre, female    DOB: 1950/05/26, 68 y.o.   MRN: [id number]  HPI: Nicole Aguirre is a 68 y.o. female presenting on 07/26/2018 for Fatigue  The patient is having a phone visit for her chronic medical conditions.  She does have chronic fatigue.  She states that she has been falling asleep.  She would like to have sleep study performed.  She does need to have labs performed and orders will be placed.  We will also send refills for as needed.  She denies any other issues at this time.  Past Medical History:  Diagnosis Date  . Allergy   . Fibromyalgia   . GERD (gastroesophageal reflux disease)   . Hyperlipidemia   . Hypertension   . PVC (premature ventricular contraction)    Relevant past medical, surgical, family and social history reviewed and updated as indicated. Interim medical history since our last visit reviewed. Allergies and medications reviewed and updated. DATA REVIEWED: CHART IN EPIC  Family History reviewed for pertinent findings.  Review of Systems  Constitutional: Positive for fatigue.  HENT: Negative.   Eyes: Negative.   Respiratory: Negative.   Gastrointestinal: Negative.   Genitourinary: Negative.   Musculoskeletal: Positive for arthralgias.    Allergies as of 07/26/2018   No Known Allergies     Medication List       Accurate as of July 26, 2018 11:59 PM. If you have any questions, ask your nurse or doctor.        amLODipine 2.5 MG tablet Commonly known as: NORVASC Take 2 tablets (5 mg total) by mouth daily. What changed: medication strength Changed by: Terald Sleeper, PA-C   atorvastatin 20 MG tablet Commonly known as: LIPITOR Take 1 tablet (20 mg total) by mouth at bedtime.   cetirizine 10 MG tablet Commonly known as: ZYRTEC Take 1 tablet (10 mg total) by mouth daily.    diltiazem 30 MG tablet Commonly known as: CARDIZEM 30 mg. TID as needed for palpitations   estradiol 0.5 MG tablet Commonly known as: ESTRACE Take 1 tablet (0.5 mg total) by mouth daily.   fluticasone 50 MCG/ACT nasal spray Commonly known as: FLONASE Place 2 sprays into both nostrils daily.   gabapentin 300 MG capsule Commonly known as: NEURONTIN Take 1 capsule (300 mg total) by mouth 2 (two) times daily.   hydrochlorothiazide 25 MG tablet Commonly known as: HYDRODIURIL Take 0.5 tablets (12.5 mg total) by mouth daily.   hyoscyamine 0.125 MG tablet Commonly known as: LEVSIN Take 1 tablet (0.125 mg total) by mouth every 4 (four) hours as needed for cramping. Started by: Terald Sleeper, PA-C   montelukast 10 MG tablet Commonly known as: SINGULAIR Take 1 tablet (10 mg total) by mouth daily.   omeprazole 40 MG capsule Commonly known as: PRILOSEC Take 1 capsule (40 mg total) by mouth daily.          Objective:    BP (!) 142/87   Pulse 63   Temp 98.6 F (37 C) (Oral)   Ht 4' 11" (1.499 m)   Wt 156 lb (70.8 kg)   BMI 31.51 kg/m   No Known Allergies  Wt Readings from Last 3 Encounters:  07/29/18 154 lb (69.9 kg)  07/26/18  156 lb (70.8 kg)  04/02/18 156 lb 3.2 oz (70.9 kg)    Physical Exam Constitutional:      Appearance: She is well-developed.  HENT:     Head: Normocephalic and atraumatic.  Eyes:     Conjunctiva/sclera: Conjunctivae normal.     Pupils: Pupils are equal, round, and reactive to light.  Cardiovascular:     Rate and Rhythm: Normal rate and regular rhythm.     Heart sounds: Normal heart sounds.  Pulmonary:     Effort: Pulmonary effort is normal.     Breath sounds: Normal breath sounds.  Abdominal:     General: Bowel sounds are normal.     Palpations: Abdomen is soft.  Skin:    General: Skin is warm and dry.     Findings: No rash.  Neurological:     Mental Status: She is alert and oriented to person, place, and time.     Deep Tendon  Reflexes: Reflexes are normal and symmetric.  Psychiatric:        Behavior: Behavior normal.        Thought Content: Thought content normal.        Judgment: Judgment normal.     Results for orders placed or performed in visit on 07/26/18  CBC with Differential/Platelet  Result Value Ref Range   WBC 4.1 3.4 - 10.8 x10E3/uL   RBC 4.30 3.77 - 5.28 x10E6/uL   Hemoglobin 12.8 11.1 - 15.9 g/dL   Hematocrit 38.7 34.0 - 46.6 %   MCV 90 79 - 97 fL   MCH 29.8 26.6 - 33.0 pg   MCHC 33.1 31.5 - 35.7 g/dL   RDW 13.2 11.7 - 15.4 %   Platelets 235 150 - 450 x10E3/uL   Neutrophils 58 Not Estab. %   Lymphs 29 Not Estab. %   Monocytes 8 Not Estab. %   Eos 4 Not Estab. %   Basos 1 Not Estab. %   Neutrophils Absolute 2.4 1.4 - 7.0 x10E3/uL   Lymphocytes Absolute 1.2 0.7 - 3.1 x10E3/uL   Monocytes Absolute 0.3 0.1 - 0.9 x10E3/uL   EOS (ABSOLUTE) 0.2 0.0 - 0.4 x10E3/uL   Basophils Absolute 0.0 0.0 - 0.2 x10E3/uL   Immature Granulocytes 0 Not Estab. %   Immature Grans (Abs) 0.0 0.0 - 0.1 x10E3/uL  CMP14+EGFR  Result Value Ref Range   Glucose 81 65 - 99 mg/dL   BUN 7 (L) 8 - 27 mg/dL   Creatinine, Ser 0.73 0.57 - 1.00 mg/dL   GFR calc non Af Amer 86 >59 mL/min/1.73   GFR calc Af Amer 99 >59 mL/min/1.73   BUN/Creatinine Ratio 10 (L) 12 - 28   Sodium 142 134 - 144 mmol/L   Potassium 4.0 3.5 - 5.2 mmol/L   Chloride 104 96 - 106 mmol/L   CO2 22 20 - 29 mmol/L   Calcium 9.1 8.7 - 10.3 mg/dL   Total Protein 7.2 6.0 - 8.5 g/dL   Albumin 4.1 3.8 - 4.8 g/dL   Globulin, Total 3.1 1.5 - 4.5 g/dL   Albumin/Globulin Ratio 1.3 1.2 - 2.2   Bilirubin Total 0.4 0.0 - 1.2 mg/dL   Alkaline Phosphatase 56 39 - 117 IU/L   AST 23 0 - 40 IU/L   ALT 16 0 - 32 IU/L  Vitamin B12  Result Value Ref Range   Vitamin B-12 314 232 - 1,245 pg/mL  TSH  Result Value Ref Range   TSH 1.860 0.450 - 4.500 uIU/mL  Assessment & Plan:   1. Chronic fatigue - CBC with Differential/Platelet - CMP14+EGFR - Vitamin  B12 - TSH  2. Essential hypertension - diltiazem (CARDIZEM) 30 MG tablet; 30 mg. TID as needed for palpitations - amLODipine (NORVASC) 2.5 MG tablet; Take 2 tablets (5 mg total) by mouth daily.  Dispense: 90 tablet; Refill: 3  3. Irritable bowel syndrome, unspecified type - hyoscyamine (LEVSIN) 0.125 MG tablet; Take 1 tablet (0.125 mg total) by mouth every 4 (four) hours as needed for cramping.  Dispense: 30 tablet; Refill: 5   Continue all other maintenance medications as listed above.  Follow up plan: Return in about 4 weeks (around 08/23/2018).  Educational handout given for [place] PA-C [facility] [address]  [place], [place] [address] [phone]   07/29/2018, 8:58 PM

## 2018-07-29 NOTE — Patient Instructions (Signed)
Contusion A contusion is a deep bruise. This is a result of an injury that causes bleeding under the skin. Symptoms of bruising include pain, swelling, and discolored skin. The skin may turn blue, purple, or yellow. Follow these instructions at home: Managing pain, stiffness, and swelling You may use RICE. This stands for:  Resting.  Icing.  Compression, or putting pressure.  Elevating, or raising the injured area. To follow this method, do these actions:  Rest the injured area.  If told, put ice on the injured area. ? Put ice in a plastic bag. ? Place a towel between your skin and the bag. ? Leave the ice on for 20 minutes, 2-3 times per day.  If told, put light pressure (compression) on the injured area using an elastic bandage. Make sure the bandage is not too tight. If the area tingles or becomes numb, remove it and put it back on as told by your doctor.  If possible, raise (elevate) the injured area above the level of your heart while you are sitting or lying down.  General instructions  Take over-the-counter and prescription medicines only as told by your doctor.  Keep all follow-up visits as told by your doctor. This is important. Contact a doctor if:  Your symptoms do not get better after several days of treatment.  Your symptoms get worse.  You have trouble moving the injured area. Get help right away if:  You have very bad pain.  You have a loss of feeling (numbness) in a hand or foot.  Your hand or foot turns pale or cold. Summary  A contusion is a deep bruise. This is a result of an injury that causes bleeding under the skin.  Symptoms of bruising include pain, swelling, and discolored skin. The skin may turn blue, purple, or yellow.  This condition is treated with rest, ice, compression, and elevation. This is also called RICE. You may be given over-the-counter medicines for pain.  Contact a doctor if you do not feel better, or you feel worse. Get  help right away if you have very bad pain, have lost feeling in a hand or foot, or the area turns pale or cold. This information is not intended to replace advice given to you by your health care provider. Make sure you discuss any questions you have with your health care provider. Document Released: 06/21/2007 Document Revised: 08/24/2017 Document Reviewed: 08/24/2017 Elsevier Patient Education  2020 Elsevier Inc.  

## 2018-08-13 ENCOUNTER — Telehealth: Payer: Self-pay | Admitting: Physician Assistant

## 2018-08-13 NOTE — Telephone Encounter (Signed)
Patient states [facility] told her she will need a prior auth on her fluticasone and singular. Pharmacy will be sending PA- FYI

## 2018-08-17 DIAGNOSIS — M7061 Trochanteric bursitis, right hip: Secondary | ICD-10-CM | POA: Diagnosis not present

## 2018-08-23 ENCOUNTER — Ambulatory Visit: Payer: Medicare Other | Admitting: Physician Assistant

## 2018-09-05 ENCOUNTER — Ambulatory Visit (INDEPENDENT_AMBULATORY_CARE_PROVIDER_SITE_OTHER): Payer: Medicare Other | Admitting: Family Medicine

## 2018-09-05 ENCOUNTER — Telehealth: Payer: Self-pay | Admitting: Family Medicine

## 2018-09-05 ENCOUNTER — Encounter: Payer: Self-pay | Admitting: Family Medicine

## 2018-09-05 DIAGNOSIS — M7061 Trochanteric bursitis, right hip: Secondary | ICD-10-CM | POA: Diagnosis not present

## 2018-09-05 MED ORDER — DICLOFENAC SODIUM 1 % TD GEL: 2.0000 g | Freq: Four times a day (QID) | TRANSDERMAL | 1 refills | Status: DC

## 2018-09-05 MED ORDER — PREDNISONE 20 MG PO TABS: ORAL_TABLET | ORAL | 0 refills | Status: DC

## 2018-09-05 NOTE — Progress Notes (Signed)
Virtual Visit via telephone Note Due to COVID-19 pandemic this visit was conducted virtually. This visit type was conducted due to national recommendations for restrictions regarding the COVID-19 Pandemic (e.g. social distancing, sheltering in place) in an effort to limit this patient's exposure and mitigate transmission in our community. All issues noted in this document were discussed and addressed.  A physical exam was not performed with this format.   I connected with [name] on 09/05/18 at 1410 by telephone and verified that I am speaking with the correct person using two identifiers. Nicole Aguirre is currently located at home and family is currently with them during visit. The provider, Monia Pouch, FNP is located in their office at time of visit.  I discussed the limitations, risks, security and privacy concerns of performing an evaluation and management service by telephone and the availability of in person appointments. I also discussed with the patient that there may be a patient responsible charge related to this service. The patient expressed understanding and agreed to proceed.  Subjective:  Patient ID: Nicole Aguirre, female    DOB: 1950/11/11, 68 y.o.   MRN: [id number]  Chief Complaint:  Hip Pain   HPI: Nicole Aguirre is a 68 y.o. female presenting on 09/05/2018 for Hip Pain   Pt reports ongoing right hip pain. States she has had this for several weeks. No known injury. She states she was seen at [facility] on 08/17/2018 and told she had bursitis of her hip. Pt states she took the steroids as prescribed and this helped but the pain has returned. She states she has tenderness to the lateral right hip. No erythema or swelling. No fever, chills, weakness, or confusion.   Hip Pain  The incident occurred more than 1 week ago. There was no injury mechanism. The pain is present in the right hip. The quality of the pain is described as aching and burning. The pain  is at a severity of 6/10. The pain is moderate. The pain has been intermittent since onset. Pertinent negatives include no inability to bear weight, loss of motion, loss of sensation, muscle weakness, numbness or tingling. The symptoms are aggravated by palpation, movement and weight bearing. She has tried NSAIDs and ice for the symptoms. The treatment provided mild relief.     Relevant past medical, surgical, family, and social history reviewed and updated as indicated.  Allergies and medications reviewed and updated.   Past Medical History:  Diagnosis Date  . Allergy   . Fibromyalgia   . GERD (gastroesophageal reflux disease)   . Hyperlipidemia   . Hypertension   . PVC (premature ventricular contraction)     Past Surgical History:  Procedure Laterality Date  . ABDOMINAL HYSTERECTOMY    . APPENDECTOMY  1985  . CATARACT EXTRACTION, BILATERAL    . CHOLECYSTECTOMY  2010    Social History   Socioeconomic History  . Marital status: Married    Spouse name: [name]  . Number of children: 2  . Years of education: 36  . Highest education level: GED or equivalent  Occupational History  . Occupation: Dietary    Comment: [employer]  Social Needs  . Financial resource strain: Not hard at all  . Food insecurity    Worry: Never true    Inability: Never true  . Transportation needs    Medical: No    Non-medical: No  Tobacco Use  . Smoking status: Former Smoker    Packs/day: 1.00    Years: 4.00  Pack years: 4.00    Quit date: 07/25/1976    Years since quitting: 42.1  . Smokeless tobacco: Never Used  Substance and Sexual Activity  . Alcohol use: Never    Frequency: Never  . Drug use: Never  . Sexual activity: Yes    Birth control/protection: Surgical  Lifestyle  . Physical activity    Days per week: 4 days    Minutes per session: 70 min  . Stress: Not at all  Relationships  . Social Herbalist on phone: Twice a week    Gets together: Twice a week    Attends  religious service: More than 4 times per year    Active member of club or organization: Yes    Attends meetings of clubs or organizations: More than 4 times per year    Relationship status: Married  . Intimate partner violence    Fear of current or ex partner: No    Emotionally abused: No    Physically abused: No    Forced sexual activity: No  Other Topics Concern  . Not on file  Social History Narrative  . Not on file    Outpatient Encounter Medications as of 09/05/2018  Medication Sig  . amLODipine (NORVASC) 2.5 MG tablet Take 2 tablets (5 mg total) by mouth daily.  Marland Kitchen atorvastatin (LIPITOR) 20 MG tablet Take 1 tablet (20 mg total) by mouth at bedtime.  . cetirizine (ZYRTEC) 10 MG tablet Take 1 tablet (10 mg total) by mouth daily.  . diclofenac sodium (VOLTAREN) 1 % GEL Apply 2 g topically 4 (four) times daily.  Marland Kitchen diltiazem (CARDIZEM) 30 MG tablet 30 mg. TID as needed for palpitations  . estradiol (ESTRACE) 0.5 MG tablet Take 1 tablet (0.5 mg total) by mouth daily.  . fluticasone (FLONASE) 50 MCG/ACT nasal spray Place 2 sprays into both nostrils daily.  Marland Kitchen gabapentin (NEURONTIN) 300 MG capsule Take 1 capsule (300 mg total) by mouth 2 (two) times daily.  . hydrochlorothiazide (HYDRODIURIL) 25 MG tablet Take 0.5 tablets (12.5 mg total) by mouth daily. (Patient taking differently: Take 12.5 mg by mouth daily as needed. )  . hyoscyamine (LEVSIN) 0.125 MG tablet Take 1 tablet (0.125 mg total) by mouth every 4 (four) hours as needed for cramping.  . montelukast (SINGULAIR) 10 MG tablet Take 1 tablet (10 mg total) by mouth daily.  Marland Kitchen omeprazole (PRILOSEC) 40 MG capsule Take 1 capsule (40 mg total) by mouth daily.  . predniSONE (DELTASONE) 20 MG tablet 2 po at sametime daily for 5 days   No facility-administered encounter medications on file as of 09/05/2018.     No Known Allergies  Review of Systems  Constitutional: Negative for activity change, appetite change, chills, diaphoresis,  fatigue, fever and unexpected weight change.  Eyes: Negative for photophobia and visual disturbance.  Respiratory: Negative for cough, shortness of breath and wheezing.   Cardiovascular: Negative for chest pain and leg swelling.  Musculoskeletal: Positive for arthralgias. Negative for gait problem, joint swelling and myalgias.  Skin: Negative for color change, pallor, rash and wound.  Neurological: Negative for dizziness, tingling, syncope, weakness, light-headedness, numbness and headaches.  Psychiatric/Behavioral: Negative for confusion.  All other systems reviewed and are negative.        Observations/Objective: No vital signs or physical exam, this was a telephone or virtual health encounter.  Pt alert and oriented, answers all questions appropriately, and able to speak in full sentences.    Assessment and Plan: Nicole was  seen today for hip pain.  Diagnoses and all orders for this visit:  Trochanteric bursitis of right hip Ongoing pain from trochanteric bursitis. Will give another burst of steroids and topical NSAIDs. Referral to ortho for evaluation and treatment. Pt aware to report any new or worsening symptoms. No red flags concerning for septic joint / arthritis. Medications as prescribed. Follow up with ortho as discussed.  -     predniSONE (DELTASONE) 20 MG tablet; 2 po at sametime daily for 5 days -     diclofenac sodium (VOLTAREN) 1 % GEL; Apply 2 g topically 4 (four) times daily. -     Ambulatory referral to Orthopedic Surgery     Follow Up Instructions: Return if symptoms worsen or fail to improve.    I discussed the assessment and treatment plan with the patient. The patient was provided an opportunity to ask questions and all were answered. The patient agreed with the plan and demonstrated an understanding of the instructions.   The patient was advised to call back or seek an in-person evaluation if the symptoms worsen or if the condition fails to improve as  anticipated.  The above assessment and management plan was discussed with the patient. The patient verbalized understanding of and has agreed to the management plan. Patient is aware to call the clinic if symptoms persist or worsen. Patient is aware when to return to the clinic for a follow-up visit. Patient educated on when it is appropriate to go to the emergency department.    I provided 15 minutes of non-face-to-face time during this encounter. The call started at 1410. The call ended at 1425. The other time was used for coordination of care.    Monia Pouch, FNP-C [facility] [address] [place], [place] [address] [phone] 09/05/18

## 2018-09-10 NOTE — Telephone Encounter (Signed)
Never received PA from pt pharmacy or insurance company.

## 2018-09-12 NOTE — Telephone Encounter (Signed)
Ref to [facility] - In-basket sent to provider to place another ref - Pt does not wish to go to [place] she would like to go to a [facility].

## 2018-09-13 ENCOUNTER — Other Ambulatory Visit: Payer: Self-pay | Admitting: Physician Assistant

## 2018-09-13 DIAGNOSIS — M7061 Trochanteric bursitis, right hip: Secondary | ICD-10-CM

## 2018-09-13 NOTE — Telephone Encounter (Signed)
done

## 2018-10-07 ENCOUNTER — Other Ambulatory Visit: Payer: Self-pay

## 2018-10-07 ENCOUNTER — Encounter: Payer: Self-pay | Admitting: Nurse Practitioner

## 2018-10-07 ENCOUNTER — Ambulatory Visit (INDEPENDENT_AMBULATORY_CARE_PROVIDER_SITE_OTHER): Payer: Medicare Other | Admitting: Nurse Practitioner

## 2018-10-07 DIAGNOSIS — N3000 Acute cystitis without hematuria: Secondary | ICD-10-CM

## 2018-10-07 MED ORDER — CEPHALEXIN 500 MG PO CAPS: 500.0000 mg | ORAL_CAPSULE | Freq: Two times a day (BID) | ORAL | 0 refills | Status: DC

## 2018-10-07 NOTE — Progress Notes (Signed)
   Virtual Visit via telephone Note Due to COVID-19 pandemic this visit was conducted virtually. This visit type was conducted due to national recommendations for restrictions regarding the COVID-19 Pandemic (e.g. social distancing, sheltering in place) in an effort to limit this patient's exposure and mitigate transmission in our community. All issues noted in this document were discussed and addressed.  A physical exam was not performed with this format.  I connected with Nicole Aguirre on 10/07/18 at 12:55 by telephone and verified that I am speaking with the correct person using two identifiers. Nicole Aguirre is currently located at home  and no one is currently with her during visit. The provider, Mary-Margaret Hassell Done, FNP is located in their office at time of visit.  I discussed the limitations, risks, security and privacy concerns of performing an evaluation and management service by telephone and the availability of in person appointments. I also discussed with the patient that there may be a patient responsible charge related to this service. The patient expressed understanding and agreed to proceed.   History and Present Illness:  Patient c/o dysuria and frequency with cloudy foul smelling urine. Appears to have pus in it. This started about 3 days ago. She has been drinking a lot of water but has not helped.   Review of Systems  Constitutional: Negative.   HENT: Negative.   Respiratory: Negative.   Cardiovascular: Negative.   Genitourinary: Positive for dysuria, frequency and urgency. Negative for flank pain and hematuria.  Skin: Negative.   Neurological: Negative.   All other systems reviewed and are negative.    Observations/Objective: Alert and oriented- answers all questions appropriately No distress    Assessment and Plan: Nicole Aguirre in today with chief complaint of No chief complaint on file.   1. Acute cystitis without hematuria Take  medication as prescribe Cotton underwear Take shower not bath Cranberry juice, yogurt Force fluids AZO over the counter X2 days RTO prn  - cephALEXin (KEFLEX) 500 MG capsule; Take 1 capsule (500 mg total) by mouth 2 (two) times daily.  Dispense: 14 capsule; Refill: 0   Follow Up Instructions: Prn   I discussed the assessment and treatment plan with the patient. The patient was provided an opportunity to ask questions and all were answered. The patient agreed with the plan and demonstrated an understanding of the instructions.   The patient was advised to call back or seek an in-person evaluation if the symptoms worsen or if the condition fails to improve as anticipated.  The above assessment and management plan was discussed with the patient. The patient verbalized understanding of and has agreed to the management plan. Patient is aware to call the clinic if symptoms persist or worsen. Patient is aware when to return to the clinic for a follow-up visit. Patient educated on when it is appropriate to go to the emergency department.   Time call ended:  1:03  I provided 8 minutes of non-face-to-face time during this encounter.    Mary-Margaret Hassell Done, FNP

## 2018-10-14 DIAGNOSIS — M25551 Pain in right hip: Secondary | ICD-10-CM | POA: Diagnosis not present

## 2018-10-14 DIAGNOSIS — M7061 Trochanteric bursitis, right hip: Secondary | ICD-10-CM | POA: Diagnosis not present

## 2018-12-19 DIAGNOSIS — M7061 Trochanteric bursitis, right hip: Secondary | ICD-10-CM | POA: Diagnosis not present

## 2019-01-07 DIAGNOSIS — M25551 Pain in right hip: Secondary | ICD-10-CM | POA: Diagnosis not present

## 2019-01-15 DIAGNOSIS — M25562 Pain in left knee: Secondary | ICD-10-CM | POA: Diagnosis not present

## 2019-01-15 DIAGNOSIS — M62559 Muscle wasting and atrophy, not elsewhere classified, unspecified thigh: Secondary | ICD-10-CM | POA: Diagnosis not present

## 2019-01-15 DIAGNOSIS — M25551 Pain in right hip: Secondary | ICD-10-CM | POA: Diagnosis not present

## 2019-01-15 DIAGNOSIS — M25462 Effusion, left knee: Secondary | ICD-10-CM | POA: Diagnosis not present

## 2019-01-23 DIAGNOSIS — M25562 Pain in left knee: Secondary | ICD-10-CM | POA: Diagnosis not present

## 2019-01-23 DIAGNOSIS — M25462 Effusion, left knee: Secondary | ICD-10-CM | POA: Diagnosis not present

## 2019-01-23 DIAGNOSIS — M24841 Other specific joint derangements of right hand, not elsewhere classified: Secondary | ICD-10-CM | POA: Diagnosis not present

## 2019-01-23 DIAGNOSIS — M25551 Pain in right hip: Secondary | ICD-10-CM | POA: Diagnosis not present

## 2019-01-23 DIAGNOSIS — M1611 Unilateral primary osteoarthritis, right hip: Secondary | ICD-10-CM | POA: Diagnosis not present

## 2019-01-30 DIAGNOSIS — M1712 Unilateral primary osteoarthritis, left knee: Secondary | ICD-10-CM | POA: Diagnosis not present

## 2019-01-30 DIAGNOSIS — M25462 Effusion, left knee: Secondary | ICD-10-CM | POA: Diagnosis not present

## 2019-01-30 DIAGNOSIS — M25551 Pain in right hip: Secondary | ICD-10-CM | POA: Diagnosis not present

## 2019-01-30 DIAGNOSIS — M7061 Trochanteric bursitis, right hip: Secondary | ICD-10-CM | POA: Diagnosis not present

## 2019-02-12 DIAGNOSIS — M47816 Spondylosis without myelopathy or radiculopathy, lumbar region: Secondary | ICD-10-CM | POA: Diagnosis not present

## 2019-02-12 DIAGNOSIS — M5137 Other intervertebral disc degeneration, lumbosacral region: Secondary | ICD-10-CM | POA: Diagnosis not present

## 2019-02-12 DIAGNOSIS — M47817 Spondylosis without myelopathy or radiculopathy, lumbosacral region: Secondary | ICD-10-CM | POA: Diagnosis not present

## 2019-02-12 DIAGNOSIS — M5136 Other intervertebral disc degeneration, lumbar region: Secondary | ICD-10-CM | POA: Diagnosis not present

## 2019-02-12 DIAGNOSIS — M545 Low back pain: Secondary | ICD-10-CM | POA: Diagnosis not present

## 2019-02-20 DIAGNOSIS — M545 Low back pain: Secondary | ICD-10-CM | POA: Diagnosis not present

## 2019-02-20 DIAGNOSIS — M5416 Radiculopathy, lumbar region: Secondary | ICD-10-CM | POA: Diagnosis not present

## 2019-03-05 DIAGNOSIS — M5416 Radiculopathy, lumbar region: Secondary | ICD-10-CM | POA: Diagnosis not present

## 2019-03-12 ENCOUNTER — Encounter: Payer: Self-pay | Admitting: Physician Assistant

## 2019-03-12 ENCOUNTER — Ambulatory Visit (INDEPENDENT_AMBULATORY_CARE_PROVIDER_SITE_OTHER): Payer: Medicare Other | Admitting: Physician Assistant

## 2019-03-12 DIAGNOSIS — M1990 Unspecified osteoarthritis, unspecified site: Secondary | ICD-10-CM

## 2019-03-12 DIAGNOSIS — T7840XS Allergy, unspecified, sequela: Secondary | ICD-10-CM

## 2019-03-12 DIAGNOSIS — K219 Gastro-esophageal reflux disease without esophagitis: Secondary | ICD-10-CM

## 2019-03-12 DIAGNOSIS — E782 Mixed hyperlipidemia: Secondary | ICD-10-CM

## 2019-03-12 MED ORDER — OMEPRAZOLE 40 MG PO CPDR: 40.0000 mg | DELAYED_RELEASE_CAPSULE | Freq: Every day | ORAL | 3 refills | Status: DC

## 2019-03-12 MED ORDER — MONTELUKAST SODIUM 10 MG PO TABS: 10.0000 mg | ORAL_TABLET | Freq: Every day | ORAL | 3 refills | Status: DC

## 2019-03-12 MED ORDER — CETIRIZINE HCL 10 MG PO TABS: 10.0000 mg | ORAL_TABLET | Freq: Every day | ORAL | 3 refills | Status: DC

## 2019-03-12 MED ORDER — ATORVASTATIN CALCIUM 20 MG PO TABS: 20.0000 mg | ORAL_TABLET | Freq: Every day | ORAL | 3 refills | Status: DC

## 2019-03-12 MED ORDER — ESTRADIOL 0.5 MG PO TABS: 0.5000 mg | ORAL_TABLET | Freq: Every day | ORAL | 3 refills | Status: DC

## 2019-03-12 MED ORDER — DULOXETINE HCL 30 MG PO CPEP: 30.0000 mg | ORAL_CAPSULE | Freq: Every day | ORAL | 3 refills | Status: DC

## 2019-03-12 NOTE — Progress Notes (Signed)
Telephone visit  Subjective: Nicole Aguirre chronic conditions PCP: Terald Sleeper, PA-C [id number] [place] is a 69 y.o. female calls for telephone consult today. Patient provides verbal consent for consult held via phone.  Patient is identified with 2 separate identifiers.  At this time the entire area is on COVID-19 social distancing and stay home orders are in place.  Patient is of higher risk and therefore we are performing this by a virtual method.  Location of patient: home Location of provider: HOME Others present for call: no  Patient has had known degenerative disc disease and hip problems.  It is flaring up a lot more at times.  She also notes that she has some bone spurs that were seen in the past.  She got epidurals last week in her back which does not help very much.  She has gotten a shot in her knee before.  She states the hip shot was no help.  Meloxicam was started.  We have discussed starting Cymbalta try to help with her osteoarthritis pain.  And she is willing to start this.  We reviewed all of her other medications.  She does need refills on some will come for labs in the near future.   ROS: Per HPI  No Known Allergies Past Medical History:  Diagnosis Date  . Allergy   . Fibromyalgia   . GERD (gastroesophageal reflux disease)   . Hyperlipidemia   . Hypertension   . PVC (premature ventricular contraction)     Current Outpatient Medications:  .  amLODipine (NORVASC) 2.5 MG tablet, Take 2 tablets (5 mg total) by mouth daily., Disp: 90 tablet, Rfl: 3 .  atorvastatin (LIPITOR) 20 MG tablet, Take 1 tablet (20 mg total) by mouth at bedtime., Disp: 90 tablet, Rfl: 3 .  cetirizine (ZYRTEC) 10 MG tablet, Take 1 tablet (10 mg total) by mouth daily., Disp: 90 tablet, Rfl: 3 .  diclofenac sodium (VOLTAREN) 1 % GEL, Apply 2 g topically 4 (four) times daily., Disp: 150 g, Rfl: 1 .  diltiazem (CARDIZEM) 30 MG tablet, 30 mg. TID as needed for palpitations, Disp: ,  Rfl:  .  DULoxetine (CYMBALTA) 30 MG capsule, Take 1 capsule (30 mg total) by mouth daily., Disp: 30 capsule, Rfl: 3 .  estradiol (ESTRACE) 0.5 MG tablet, Take 1 tablet (0.5 mg total) by mouth daily., Disp: 90 tablet, Rfl: 3 .  fluticasone (FLONASE) 50 MCG/ACT nasal spray, Place 2 sprays into both nostrils daily., Disp: 48 g, Rfl: 3 .  gabapentin (NEURONTIN) 300 MG capsule, Take 1 capsule (300 mg total) by mouth 2 (two) times daily., Disp: 180 capsule, Rfl: 3 .  hydrochlorothiazide (HYDRODIURIL) 25 MG tablet, Take 0.5 tablets (12.5 mg total) by mouth daily. (Patient taking differently: Take 12.5 mg by mouth daily as needed. ), Disp: 45 tablet, Rfl: 3 .  hyoscyamine (LEVSIN) 0.125 MG tablet, Take 1 tablet (0.125 mg total) by mouth every 4 (four) hours as needed for cramping., Disp: 30 tablet, Rfl: 5 .  montelukast (SINGULAIR) 10 MG tablet, Take 1 tablet (10 mg total) by mouth daily., Disp: 90 tablet, Rfl: 3 .  omeprazole (PRILOSEC) 40 MG capsule, Take 1 capsule (40 mg total) by mouth daily., Disp: 90 capsule, Rfl: 3  Assessment/ Plan: 68 y.o. female   1. Mixed hyperlipidemia - atorvastatin (LIPITOR) 20 MG tablet; Take 1 tablet (20 mg total) by mouth at bedtime.  Dispense: 90 tablet; Refill: 3  2. Allergy, sequela - cetirizine (ZYRTEC) 10 MG  tablet; Take 1 tablet (10 mg total) by mouth daily.  Dispense: 90 tablet; Refill: 3 - montelukast (SINGULAIR) 10 MG tablet; Take 1 tablet (10 mg total) by mouth daily.  Dispense: 90 tablet; Refill: 3  3. Gastroesophageal reflux disease without esophagitis - omeprazole (PRILOSEC) 40 MG capsule; Take 1 capsule (40 mg total) by mouth daily.  Dispense: 90 capsule; Refill: 3  4. Arthritis - DULoxetine (CYMBALTA) 30 MG capsule; Take 1 capsule (30 mg total) by mouth daily.  Dispense: 30 capsule; Refill: 3   Return in about 4 weeks (around 04/09/2019).  Continue all other maintenance medications as listed above.  Start time: 9:37 AM End time: 9:51 AM  Meds  ordered this encounter  Medications  . atorvastatin (LIPITOR) 20 MG tablet    Sig: Take 1 tablet (20 mg total) by mouth at bedtime.    Dispense:  90 tablet    Refill:  3    Order Specific Question:   Supervising Provider    Answer:   Janora Norlander [id number]  . cetirizine (ZYRTEC) 10 MG tablet    Sig: Take 1 tablet (10 mg total) by mouth daily.    Dispense:  90 tablet    Refill:  3    Order Specific Question:   Supervising Provider    Answer:   Janora Norlander [id number]  . estradiol (ESTRACE) 0.5 MG tablet    Sig: Take 1 tablet (0.5 mg total) by mouth daily.    Dispense:  90 tablet    Refill:  3    Order Specific Question:   Supervising Provider    Answer:   Janora Norlander [id number]  . omeprazole (PRILOSEC) 40 MG capsule    Sig: Take 1 capsule (40 mg total) by mouth daily.    Dispense:  90 capsule    Refill:  3    Order Specific Question:   Supervising Provider    Answer:   Janora Norlander [id number]  . montelukast (SINGULAIR) 10 MG tablet    Sig: Take 1 tablet (10 mg total) by mouth daily.    Dispense:  90 tablet    Refill:  3    Order Specific Question:   Supervising Provider    Answer:   Janora Norlander [id number]  . DULoxetine (CYMBALTA) 30 MG capsule    Sig: Take 1 capsule (30 mg total) by mouth daily.    Dispense:  30 capsule    Refill:  3    Order Specific Question:   Supervising Provider    Answer:   Janora Norlander [id number]    Particia Nearing PA-C [place] [phone]

## 2019-03-16 ENCOUNTER — Encounter: Payer: Self-pay | Admitting: Physician Assistant

## 2019-03-18 ENCOUNTER — Other Ambulatory Visit: Payer: Self-pay | Admitting: Physician Assistant

## 2019-03-18 DIAGNOSIS — T7840XS Allergy, unspecified, sequela: Secondary | ICD-10-CM

## 2019-03-20 DIAGNOSIS — M545 Low back pain: Secondary | ICD-10-CM | POA: Diagnosis not present

## 2019-03-20 DIAGNOSIS — M5416 Radiculopathy, lumbar region: Secondary | ICD-10-CM | POA: Diagnosis not present

## 2019-03-20 DIAGNOSIS — M25551 Pain in right hip: Secondary | ICD-10-CM | POA: Diagnosis not present

## 2019-03-28 ENCOUNTER — Telehealth: Payer: Self-pay | Admitting: Physician Assistant

## 2019-03-28 DIAGNOSIS — M25551 Pain in right hip: Secondary | ICD-10-CM | POA: Diagnosis not present

## 2019-03-28 NOTE — Telephone Encounter (Signed)
Left message to please call our office to schedule an appointment if you have wax build up in ears.  Ringing ears does not usually mean there is wax problems.

## 2019-03-31 ENCOUNTER — Encounter: Payer: Self-pay | Admitting: Family Medicine

## 2019-03-31 ENCOUNTER — Ambulatory Visit (INDEPENDENT_AMBULATORY_CARE_PROVIDER_SITE_OTHER): Payer: Medicare Other | Admitting: Family Medicine

## 2019-03-31 ENCOUNTER — Other Ambulatory Visit: Payer: Self-pay

## 2019-03-31 VITALS — BP 152/87 | HR 67 | Temp 98.6°F | Resp 20 | Ht 59.0 in | Wt 158.0 lb

## 2019-03-31 DIAGNOSIS — H6123 Impacted cerumen, bilateral: Secondary | ICD-10-CM

## 2019-03-31 NOTE — Progress Notes (Signed)
Subjective:  Patient ID: Nicole Aguirre, female    DOB: Sep 24, 1950, 69 y.o.   MRN: [id number]  Patient Care Team: Theodoro Clock as PCP - General (Physician Assistant)   Chief Complaint:  Cerumen Impaction ("roaring" )   HPI: Nicole Aguirre is a 69 y.o. female presenting on 03/31/2019 for Cerumen Impaction ("roaring" )   Pt presents today with complaints of a roaring noise in her ears, states worse in right. No pain, injury, or drainage. No fever, chills, headaches, or dizziness.      Relevant past medical, surgical, family, and social history reviewed and updated as indicated.  Allergies and medications reviewed and updated. Date reviewed: Chart in Epic.   Past Medical History:  Diagnosis Date  . Allergy   . Fibromyalgia   . GERD (gastroesophageal reflux disease)   . Hyperlipidemia   . Hypertension   . PVC (premature ventricular contraction)     Past Surgical History:  Procedure Laterality Date  . ABDOMINAL HYSTERECTOMY    . APPENDECTOMY  1985  . CATARACT EXTRACTION, BILATERAL    . CHOLECYSTECTOMY  2010    Social History   Socioeconomic History  . Marital status: Married    Spouse name: [name]  . Number of children: 2  . Years of education: 24  . Highest education level: GED or equivalent  Occupational History  . Occupation: Dietary    Comment: [employer]  Tobacco Use  . Smoking status: Former Smoker    Packs/day: 1.00    Years: 4.00    Pack years: 4.00    Quit date: 07/25/1976    Years since quitting: 42.7  . Smokeless tobacco: Never Used  Substance and Sexual Activity  . Alcohol use: Never  . Drug use: Never  . Sexual activity: Yes    Birth control/protection: Surgical  Other Topics Concern  . Not on file  Social History Narrative  . Not on file   Social Determinants of Health   Financial Resource Strain: Low Risk   . Difficulty of Paying Living Expenses: Not hard at all  Food Insecurity: No Food Insecurity  . Worried About  Charity fundraiser in the Last Year: Never true  . Ran Out of Food in the Last Year: Never true  Transportation Needs: No Transportation Needs  . Lack of Transportation (Medical): No  . Lack of Transportation (Non-Medical): No  Physical Activity: Sufficiently Active  . Days of Exercise per Week: 4 days  . Minutes of Exercise per Session: 70 min  Stress: No Stress Concern Present  . Feeling of Stress : Not at all  Social Connections: Not Isolated  . Frequency of Communication with Friends and Family: Twice a week  . Frequency of Social Gatherings with Friends and Family: Twice a week  . Attends Religious Services: More than 4 times per year  . Active Member of Clubs or Organizations: Yes  . Attends Archivist Meetings: More than 4 times per year  . Marital Status: Married  Human resources officer Violence: Not At Risk  . Fear of Current or Ex-Partner: No  . Emotionally Abused: No  . Physically Abused: No  . Sexually Abused: No    Outpatient Encounter Medications as of 03/31/2019  Medication Sig  . amLODipine (NORVASC) 2.5 MG tablet Take 2 tablets (5 mg total) by mouth daily.  Marland Kitchen atorvastatin (LIPITOR) 20 MG tablet Take 1 tablet (20 mg total) by mouth at bedtime.  . cetirizine (ZYRTEC) 10 MG tablet Take  1 tablet (10 mg total) by mouth daily.  . diclofenac sodium (VOLTAREN) 1 % GEL Apply 2 g topically 4 (four) times daily.  Marland Kitchen diltiazem (CARDIZEM) 30 MG tablet 30 mg. TID as needed for palpitations  . DULoxetine (CYMBALTA) 30 MG capsule Take 1 capsule (30 mg total) by mouth daily.  Marland Kitchen estradiol (ESTRACE) 0.5 MG tablet Take 1 tablet (0.5 mg total) by mouth daily.  . fluticasone (FLONASE) 50 MCG/ACT nasal spray Place 2 sprays into both nostrils daily.  Marland Kitchen gabapentin (NEURONTIN) 300 MG capsule Take 1 capsule (300 mg total) by mouth 2 (two) times daily.  . hydrochlorothiazide (HYDRODIURIL) 25 MG tablet Take 0.5 tablets (12.5 mg total) by mouth daily. (Patient taking differently: Take 12.5  mg by mouth daily as needed. )  . hyoscyamine (LEVSIN) 0.125 MG tablet Take 1 tablet (0.125 mg total) by mouth every 4 (four) hours as needed for cramping.  . montelukast (SINGULAIR) 10 MG tablet TAKE 1 TABLET BY MOUTH EVERY DAY  . omeprazole (PRILOSEC) 40 MG capsule Take 1 capsule (40 mg total) by mouth daily.   No facility-administered encounter medications on file as of 03/31/2019.    No Known Allergies  Review of Systems  Constitutional: Negative for activity change, appetite change, chills, diaphoresis, fatigue, fever and unexpected weight change.  HENT: Positive for hearing loss and tinnitus. Negative for ear discharge and ear pain.   Eyes: Negative.  Negative for photophobia and visual disturbance.  Respiratory: Negative for cough, chest tightness and shortness of breath.   Cardiovascular: Negative for chest pain, palpitations and leg swelling.  Gastrointestinal: Negative for abdominal pain, blood in stool, constipation, diarrhea, nausea and vomiting.  Endocrine: Negative.   Genitourinary: Negative for decreased urine volume, difficulty urinating, dysuria, frequency and urgency.  Musculoskeletal: Negative for arthralgias and myalgias.  Skin: Negative.   Allergic/Immunologic: Negative.   Neurological: Negative for dizziness, light-headedness and headaches.  Hematological: Negative.  Negative for adenopathy.  Psychiatric/Behavioral: Negative for confusion, hallucinations, sleep disturbance and suicidal ideas.  All other systems reviewed and are negative.       Objective:  BP (!) 152/87 (BP Location: Right Wrist, Cuff Size: Small)   Pulse 67   Temp 98.6 F (37 C)   Resp 20   Ht '4\' 11"'$  (1.499 m)   Wt 158 lb (71.7 kg)   SpO2 98%   BMI 31.91 kg/m    Wt Readings from Last 3 Encounters:  03/31/19 158 lb (71.7 kg)  07/29/18 154 lb (69.9 kg)  07/26/18 156 lb (70.8 kg)    Physical Exam Vitals and nursing note reviewed.  Constitutional:      General: She is not in acute  distress.    Appearance: Normal appearance. She is well-developed and well-groomed. She is obese. She is not ill-appearing, toxic-appearing or diaphoretic.  HENT:     Head: Normocephalic and atraumatic.     Jaw: There is normal jaw occlusion.     Right Ear: Hearing normal. There is impacted cerumen.     Left Ear: Hearing normal. There is impacted cerumen.     Nose: Nose normal.     Mouth/Throat:     Lips: Pink.     Mouth: Mucous membranes are moist.     Pharynx: Oropharynx is clear. Uvula midline.  Eyes:     General: Lids are normal.     Extraocular Movements: Extraocular movements intact.     Conjunctiva/sclera: Conjunctivae normal.     Pupils: Pupils are equal, round, and reactive to light.  Neck:     Thyroid: No thyroid mass, thyromegaly or thyroid tenderness.     Vascular: No carotid bruit or JVD.     Trachea: Trachea and phonation normal.  Cardiovascular:     Rate and Rhythm: Normal rate and regular rhythm.     Chest Wall: PMI is not displaced.     Pulses: Normal pulses.     Heart sounds: Normal heart sounds. No murmur. No friction rub. No gallop.   Pulmonary:     Effort: Pulmonary effort is normal. No respiratory distress.     Breath sounds: Normal breath sounds. No wheezing.  Abdominal:     General: Bowel sounds are normal. There is no distension or abdominal bruit.     Palpations: Abdomen is soft. There is no hepatomegaly or splenomegaly.     Tenderness: There is no abdominal tenderness. There is no right CVA tenderness or left CVA tenderness.     Hernia: No hernia is present.  Musculoskeletal:        General: Normal range of motion.     Cervical back: Normal range of motion and neck supple.     Right lower leg: No edema.     Left lower leg: No edema.  Lymphadenopathy:     Cervical: No cervical adenopathy.  Skin:    General: Skin is warm and dry.     Capillary Refill: Capillary refill takes less than 2 seconds.     Coloration: Skin is not cyanotic, jaundiced or  pale.     Findings: No rash.  Neurological:     General: No focal deficit present.     Mental Status: She is alert and oriented to person, place, and time.     Cranial Nerves: Cranial nerves are intact. No cranial nerve deficit.     Sensory: Sensation is intact. No sensory deficit.     Motor: Motor function is intact. No weakness.     Coordination: Coordination is intact. Coordination normal.     Gait: Gait is intact. Gait normal.     Deep Tendon Reflexes: Reflexes are normal and symmetric. Reflexes normal.  Psychiatric:        Attention and Perception: Attention and perception normal.        Mood and Affect: Mood and affect normal.        Speech: Speech normal.        Behavior: Behavior normal. Behavior is cooperative.        Thought Content: Thought content normal.        Cognition and Memory: Cognition and memory normal.        Judgment: Judgment normal.     Results for orders placed or performed in visit on 07/26/18  CBC with Differential/Platelet  Result Value Ref Range   WBC 4.1 3.4 - 10.8 x10E3/uL   RBC 4.30 3.77 - 5.28 x10E6/uL   Hemoglobin 12.8 11.1 - 15.9 g/dL   Hematocrit 38.7 34.0 - 46.6 %   MCV 90 79 - 97 fL   MCH 29.8 26.6 - 33.0 pg   MCHC 33.1 31.5 - 35.7 g/dL   RDW 13.2 11.7 - 15.4 %   Platelets 235 150 - 450 x10E3/uL   Neutrophils 58 Not Estab. %   Lymphs 29 Not Estab. %   Monocytes 8 Not Estab. %   Eos 4 Not Estab. %   Basos 1 Not Estab. %   Neutrophils Absolute 2.4 1.4 - 7.0 x10E3/uL   Lymphocytes Absolute 1.2 0.7 - 3.1 x10E3/uL  Monocytes Absolute 0.3 0.1 - 0.9 x10E3/uL   EOS (ABSOLUTE) 0.2 0.0 - 0.4 x10E3/uL   Basophils Absolute 0.0 0.0 - 0.2 x10E3/uL   Immature Granulocytes 0 Not Estab. %   Immature Grans (Abs) 0.0 0.0 - 0.1 x10E3/uL  CMP14+EGFR  Result Value Ref Range   Glucose 81 65 - 99 mg/dL   BUN 7 (L) 8 - 27 mg/dL   Creatinine, Ser 0.73 0.57 - 1.00 mg/dL   GFR calc non Af Amer 86 >59 mL/min/1.73   GFR calc Af Amer 99 >59 mL/min/1.73    BUN/Creatinine Ratio 10 (L) 12 - 28   Sodium 142 134 - 144 mmol/L   Potassium 4.0 3.5 - 5.2 mmol/L   Chloride 104 96 - 106 mmol/L   CO2 22 20 - 29 mmol/L   Calcium 9.1 8.7 - 10.3 mg/dL   Total Protein 7.2 6.0 - 8.5 g/dL   Albumin 4.1 3.8 - 4.8 g/dL   Globulin, Total 3.1 1.5 - 4.5 g/dL   Albumin/Globulin Ratio 1.3 1.2 - 2.2   Bilirubin Total 0.4 0.0 - 1.2 mg/dL   Alkaline Phosphatase 56 39 - 117 IU/L   AST 23 0 - 40 IU/L   ALT 16 0 - 32 IU/L  Vitamin B12  Result Value Ref Range   Vitamin B-12 314 232 - 1,245 pg/mL  TSH  Result Value Ref Range   TSH 1.860 0.450 - 4.500 uIU/mL     Ear Cerumen Removal  Date/Time: 03/31/2019 8:30 AM Performed by: Baruch Gouty, FNP Authorized by: Baruch Gouty, FNP   Anesthesia: Local Anesthetic: none Location details: right ear Patient tolerance: patient tolerated the procedure well with no immediate complications Procedure type: curette  Sedation: Patient sedated: no   Ear Cerumen Removal  Date/Time: 03/31/2019 8:30 AM Performed by: Baruch Gouty, FNP Authorized by: Baruch Gouty, FNP   Anesthesia: Local Anesthetic: none Location details: left ear Patient tolerance: patient tolerated the procedure well with no immediate complications Procedure type: irrigation  Sedation: Patient sedated: no     Bilateral TM WNL post cerumen removal.   Pertinent labs & imaging results that were available during my care of the patient were reviewed by me and considered in my medical decision making.  Assessment & Plan:  Nicole Aguirre was seen today for cerumen impaction.  Diagnoses and all orders for this visit:  Bilateral impacted cerumen Cerumen removal in office. Tolerated well. TM WNL post removal. Pt aware to use Debrox at least three times per week to prevent further cerumen buildup.  -     Ear Cerumen Removal -     Ear Cerumen Removal     Continue all other maintenance medications.  Follow up plan: Return if symptoms worsen or  fail to improve.  Continue healthy lifestyle choices, including diet (rich in fruits, vegetables, and lean proteins, and low in salt and simple carbohydrates) and exercise (at least 30 minutes of moderate physical activity daily).  Educational handout given for cerumen impaction   The above assessment and management plan was discussed with the patient. The patient verbalized understanding of and has agreed to the management plan. Patient is aware to call the clinic if they develop any new symptoms or if symptoms persist or worsen. Patient is aware when to return to the clinic for a follow-up visit. Patient educated on when it is appropriate to go to the emergency department.   Monia Pouch, FNP-C [facility] [phone]

## 2019-03-31 NOTE — Patient Instructions (Signed)
Earwax Buildup, Adult The ears produce a substance called earwax that helps keep bacteria out of the ear and protects the skin in the ear canal. Occasionally, earwax can build up in the ear and cause discomfort or hearing loss. What increases the risk? This condition is more likely to develop in people who:  Are female.  Are elderly.  Naturally produce more earwax.  Clean their ears often with cotton swabs.  Use earplugs often.  Use in-ear headphones often.  Wear hearing aids.  Have narrow ear canals.  Have earwax that is overly thick or sticky.  Have eczema.  Are dehydrated.  Have excess hair in the ear canal. What are the signs or symptoms? Symptoms of this condition include:  Reduced or muffled hearing.  A feeling of fullness in the ear or feeling that the ear is plugged.  Fluid coming from the ear.  Ear pain.  Ear itch.  Ringing in the ear.  Coughing.  An obvious piece of earwax that can be seen inside the ear canal. How is this diagnosed? This condition may be diagnosed based on:  Your symptoms.  Your medical history.  An ear exam. During the exam, your health care provider will look into your ear with an instrument called an otoscope. You may have tests, including a hearing test. How is this treated? This condition may be treated by:  Using ear drops to soften the earwax.  Having the earwax removed by a health care provider. The health care provider may: ? Flush the ear with water. ? Use an instrument that has a loop on the end (curette). ? Use a suction device.  Surgery to remove the wax buildup. This may be done in severe cases. Follow these instructions at home:   Take over-the-counter and prescription medicines only as told by your health care provider.  Do not put any objects, including cotton swabs, into your ear. You can clean the opening of your ear canal with a washcloth or facial tissue.  Follow instructions from your health care  provider about cleaning your ears. Do not over-clean your ears.  Drink enough fluid to keep your urine clear or pale yellow. This will help to thin the earwax.  Keep all follow-up visits as told by your health care provider. If earwax builds up in your ears often or if you use hearing aids, consider seeing your health care provider for routine, preventive ear cleanings. Ask your health care provider how often you should schedule your cleanings.  If you have hearing aids, clean them according to instructions from the manufacturer and your health care provider. Contact a health care provider if:  You have ear pain.  You develop a fever.  You have blood, pus, or other fluid coming from your ear.  You have hearing loss.  You have ringing in your ears that does not go away.  Your symptoms do not improve with treatment.  You feel like the room is spinning (vertigo). Summary  Earwax can build up in the ear and cause discomfort or hearing loss.  The most common symptoms of this condition include reduced or muffled hearing and a feeling of fullness in the ear or feeling that the ear is plugged.  This condition may be diagnosed based on your symptoms, your medical history, and an ear exam.  This condition may be treated by using ear drops to soften the earwax or by having the earwax removed by a health care provider.  Do not put any   objects, including cotton swabs, into your ear. You can clean the opening of your ear canal with a washcloth or facial tissue. This information is not intended to replace advice given to you by your health care provider. Make sure you discuss any questions you have with your health care provider. Document Revised: 12/15/2016 Document Reviewed: 03/15/2016 Elsevier Patient Education  2020 [employer]  

## 2019-04-02 ENCOUNTER — Telehealth: Payer: Self-pay | Admitting: Physician Assistant

## 2019-04-02 NOTE — Telephone Encounter (Signed)
Patient aware it will take 6-8 weeks to get in system good.

## 2019-04-09 ENCOUNTER — Other Ambulatory Visit: Payer: Self-pay | Admitting: Physician Assistant

## 2019-04-09 ENCOUNTER — Telehealth: Payer: Self-pay | Admitting: Physician Assistant

## 2019-04-09 DIAGNOSIS — T7840XS Allergy, unspecified, sequela: Secondary | ICD-10-CM

## 2019-04-09 NOTE — Telephone Encounter (Signed)
Left message , your insurance will not pay for zyrtec.  You will have to purchase over the counter.

## 2019-04-15 ENCOUNTER — Telehealth: Payer: Self-pay | Admitting: Physician Assistant

## 2019-04-15 NOTE — Chronic Care Management (AMB) (Signed)
  Chronic Care Management   Outreach Note  04/15/2019 Name: [name] MRN: [id number] DOB: [birth year]  Nicole Aguirre is a 69 y.o. year old female who is a primary care patient of Terald Sleeper, PA-C. I reached out to [name] by phone today in response to a referral sent by [name]'s health plan.     An unsuccessful telephone outreach was attempted today. The patient was referred to the case management team for assistance with care management and care coordination.   Follow Up Plan: A HIPPA compliant phone message was left for the patient providing contact information and requesting a return call. The care management team will reach out to the patient again over the next 7 days. If patient returns call to provider office, please advise to call [place] at [phone].  [place], [place] [address] Direct Dial: [phone] [email or web address]: [email or web address]

## 2019-04-16 ENCOUNTER — Encounter: Payer: Self-pay | Admitting: Family Medicine

## 2019-04-16 ENCOUNTER — Telehealth (INDEPENDENT_AMBULATORY_CARE_PROVIDER_SITE_OTHER): Payer: Medicare Other | Admitting: Family Medicine

## 2019-04-16 DIAGNOSIS — T7840XS Allergy, unspecified, sequela: Secondary | ICD-10-CM

## 2019-04-16 DIAGNOSIS — M797 Fibromyalgia: Secondary | ICD-10-CM | POA: Insufficient documentation

## 2019-04-16 MED ORDER — CETIRIZINE HCL 10 MG PO TABS: 10.0000 mg | ORAL_TABLET | Freq: Every day | ORAL | 0 refills | Status: DC

## 2019-04-16 MED ORDER — CETIRIZINE HCL 10 MG PO TABS: 10.0000 mg | ORAL_TABLET | Freq: Every day | ORAL | 2 refills | Status: DC

## 2019-04-16 NOTE — Progress Notes (Signed)
Virtual Visit via Telephone Note  I connected with [name] on 04/16/19 at 3:47 PM by telephone and verified that I am speaking with the correct person using two identifiers. Nicole Aguirre is currently located at home and nobody is currently with her during this visit. The provider, [name], FNP is located in their home at time of visit.  I discussed the limitations, risks, security and privacy concerns of performing an evaluation and management service by telephone and the availability of in person appointments. I also discussed with the patient that there may be a patient responsible charge related to this service. The patient expressed understanding and agreed to proceed.  Subjective: PCP: [name], FNP  Chief Complaint  Patient presents with  . Medication Refill   Patient is in need of a refill of her Zyrtec. She states she was told her previous PCP sent a prescription to [facility] but when she called them, they said they didn't have anything for her. She would now like a small prescription sent to [facility] [place] and her normal prescription sent to [facility].    ROS: Per HPI  Current Outpatient Medications:  .  amLODipine (NORVASC) 2.5 MG tablet, Take 2 tablets (5 mg total) by mouth daily., Disp: 90 tablet, Rfl: 3 .  atorvastatin (LIPITOR) 20 MG tablet, Take 1 tablet (20 mg total) by mouth at bedtime., Disp: 90 tablet, Rfl: 3 .  cetirizine (ZYRTEC) 10 MG tablet, Take 1 tablet (10 mg total) by mouth daily., Disp: 90 tablet, Rfl: 2 .  diclofenac sodium (VOLTAREN) 1 % GEL, Apply 2 g topically 4 (four) times daily., Disp: 150 g, Rfl: 1 .  diltiazem (CARDIZEM) 30 MG tablet, 30 mg. TID as needed for palpitations, Disp: , Rfl:  .  DULoxetine (CYMBALTA) 30 MG capsule, Take 1 capsule (30 mg total) by mouth daily., Disp: 30 capsule, Rfl: 3 .  estradiol (ESTRACE) 0.5 MG tablet, Take 1 tablet (0.5 mg total) by mouth daily., Disp: 90 tablet, Rfl: 3 .  fluticasone  (FLONASE) 50 MCG/ACT nasal spray, Place 2 sprays into both nostrils daily., Disp: 48 g, Rfl: 3 .  gabapentin (NEURONTIN) 300 MG capsule, Take 1 capsule (300 mg total) by mouth 2 (two) times daily., Disp: 180 capsule, Rfl: 3 .  hydrochlorothiazide (HYDRODIURIL) 25 MG tablet, Take 0.5 tablets (12.5 mg total) by mouth daily. (Patient taking differently: Take 12.5 mg by mouth daily as needed. ), Disp: 45 tablet, Rfl: 3 .  hyoscyamine (LEVSIN) 0.125 MG tablet, Take 1 tablet (0.125 mg total) by mouth every 4 (four) hours as needed for cramping., Disp: 30 tablet, Rfl: 5 .  montelukast (SINGULAIR) 10 MG tablet, TAKE 1 TABLET BY MOUTH EVERY DAY, Disp: 90 tablet, Rfl: 3 .  omeprazole (PRILOSEC) 40 MG capsule, Take 1 capsule (40 mg total) by mouth daily., Disp: 90 capsule, Rfl: 3  No Known Allergies Past Medical History:  Diagnosis Date  . Allergy   . Fibromyalgia   . GERD (gastroesophageal reflux disease)   . Hyperlipidemia   . Hypertension   . PVC (premature ventricular contraction)     Observations/Objective: A&O  No respiratory distress or wheezing audible over the phone Mood, judgement, and thought processes all WNL  Assessment and Plan: 1. Allergy, sequela - [facility]; 90 tablets with 2 refills sent to [facility].  - cetirizine (ZYRTEC) 10 MG tablet; Take 1 tablet (10 mg total) by mouth daily.  Dispense: 90  tablet; Refill: 2   Follow Up Instructions:  I discussed the assessment and treatment plan with the patient. The patient was provided an opportunity to ask questions and all were answered. The patient agreed with the plan and demonstrated an understanding of the instructions.   The patient was advised to call back or seek an in-person evaluation if the symptoms worsen or if the condition fails to improve as anticipated.  The above assessment and management plan was discussed with the patient. The patient verbalized understanding of and has agreed to the  management plan. Patient is aware to call the clinic if symptoms persist or worsen. Patient is aware when to return to the clinic for a follow-up visit. Patient educated on when it is appropriate to go to the emergency department.   Time call ended: 3:51 PM  I provided 6 minutes of non-face-to-face time during this encounter.  [name], MSN, APRN, FNP-C [facility] 04/16/19

## 2019-04-17 DIAGNOSIS — M5416 Radiculopathy, lumbar region: Secondary | ICD-10-CM | POA: Diagnosis not present

## 2019-04-17 DIAGNOSIS — M25551 Pain in right hip: Secondary | ICD-10-CM | POA: Diagnosis not present

## 2019-04-24 DIAGNOSIS — M5416 Radiculopathy, lumbar region: Secondary | ICD-10-CM | POA: Diagnosis not present

## 2019-04-24 DIAGNOSIS — M7061 Trochanteric bursitis, right hip: Secondary | ICD-10-CM | POA: Diagnosis not present

## 2019-04-24 DIAGNOSIS — M25551 Pain in right hip: Secondary | ICD-10-CM | POA: Diagnosis not present

## 2019-04-28 NOTE — Chronic Care Management (AMB) (Signed)
  Chronic Care Management   Note  04/28/2019 Name: [name] MRN: [id number] DOB: Feb 27, 1950  Nicole Aguirre is a 69 y.o. year old female who is a primary care patient of [name], FNP. I reached out to [name] by phone today in response to a referral sent by Ms. [place]'s health plan.     Ms. Krist was given information about Chronic Care Management services today including:  1. CCM service includes personalized support from designated clinical staff supervised by her physician, including individualized plan of care and coordination with other care providers 2. 24/7 contact phone numbers for assistance for urgent and routine care needs. 3. Service will only be billed when office clinical staff spend 20 minutes or more in a month to coordinate care. 4. Only one practitioner may furnish and bill the service in a calendar month. 5. The patient may stop CCM services at any time (effective at the end of the month) by phone call to the office staff. 6. The patient will be responsible for cost sharing (co-pay) of up to 20% of the service fee (after annual deductible is met).  Patient did not agree to enrollment in care management services and does not wish to consider at this time.  Follow up plan: The patient has been provided with contact information for the care management team and has been advised to call with any health related questions or concerns.   [place], [place] [address] Direct Dial: [phone] [email or web address]

## 2019-05-02 ENCOUNTER — Telehealth: Payer: Self-pay | Admitting: Family Medicine

## 2019-05-02 DIAGNOSIS — T7840XS Allergy, unspecified, sequela: Secondary | ICD-10-CM

## 2019-05-02 MED ORDER — CETIRIZINE HCL 10 MG PO TABS: 10.0000 mg | ORAL_TABLET | Freq: Every day | ORAL | 2 refills | Status: DC

## 2019-05-02 NOTE — Telephone Encounter (Signed)
Prescription for 90 day supply of Cetirizine sent to [place], patient aware.

## 2019-05-07 ENCOUNTER — Encounter: Payer: Medicare Other | Admitting: Family Medicine

## 2019-05-07 ENCOUNTER — Encounter: Payer: Self-pay | Admitting: Family Medicine

## 2019-05-07 ENCOUNTER — Other Ambulatory Visit: Payer: Self-pay

## 2019-05-07 NOTE — Progress Notes (Signed)
Attempted to call patient 3 times with no answer. VM left but not return call.

## 2019-05-08 ENCOUNTER — Ambulatory Visit (INDEPENDENT_AMBULATORY_CARE_PROVIDER_SITE_OTHER): Payer: Medicare Other | Admitting: Nurse Practitioner

## 2019-05-08 ENCOUNTER — Encounter: Payer: Self-pay | Admitting: Nurse Practitioner

## 2019-05-08 DIAGNOSIS — J301 Allergic rhinitis due to pollen: Secondary | ICD-10-CM

## 2019-05-08 MED ORDER — PREDNISONE 10 MG (21) PO TBPK: ORAL_TABLET | ORAL | 0 refills | Status: DC

## 2019-05-08 NOTE — Progress Notes (Signed)
   Virtual Visit via telephone Note Due to COVID-19 pandemic this visit was conducted virtually. This visit type was conducted due to national recommendations for restrictions regarding the COVID-19 Pandemic (e.g. social distancing, sheltering in place) in an effort to limit this patient's exposure and mitigate transmission in our community. All issues noted in this document were discussed and addressed.  A physical exam was not performed with this format.  I connected with Nicole Aguirre on 05/08/19 at 8:30 by telephone and verified that I am speaking with the correct person using two identifiers. Nicole Aguirre is currently located at home and no one is currently with her during visit. The provider, Mary-Margaret Hassell Done, FNP is located in their office at time of visit.  I discussed the limitations, risks, security and privacy concerns of performing an evaluation and management service by telephone and the availability of in person appointments. I also discussed with the patient that there may be a patient responsible charge related to this service. The patient expressed understanding and agreed to proceed.   History and Present Illness:   Chief Complaint: Allergies   HPI Patient calls in c/o stuffy head with productive cough. She has bad allergies and is on zyrtec and singulair and flonase. This has been gradually worsening over the last 2 weeks.    Review of Systems  Constitutional: Negative for chills and fever.  HENT: Positive for congestion. Negative for ear discharge, ear pain, sinus pain and sore throat.   Respiratory: Positive for cough (productive).   Genitourinary: Negative.   Neurological: Negative for dizziness and headaches.  Psychiatric/Behavioral: Negative.      Observations/Objective: Alert and oriented- answers all questions appropriately No distress Voice is hoarse Slight cough audible  Assessment and Plan: Nicole Aguirre in today with chief  complaint of Allergies   1. Seasonal allergic rhinitis due to pollen Force fluids Avoid going outside without a mask Continue flonase, zyrtec and singulair Meds ordered this encounter  Medications  . predniSONE (STERAPRED UNI-PAK 21 TAB) 10 MG (21) TBPK tablet    Sig: As directed x 6 days    Dispense:  21 tablet    Refill:  0    Order Specific Question:   Supervising Provider    Answer:   Caryl Pina A [id number]       Follow Up Instructions: prn    I discussed the assessment and treatment plan with the patient. The patient was provided an opportunity to ask questions and all were answered. The patient agreed with the plan and demonstrated an understanding of the instructions.   The patient was advised to call back or seek an in-person evaluation if the symptoms worsen or if the condition fails to improve as anticipated.  The above assessment and management plan was discussed with the patient. The patient verbalized understanding of and has agreed to the management plan. Patient is aware to call the clinic if symptoms persist or worsen. Patient is aware when to return to the clinic for a follow-up visit. Patient educated on when it is appropriate to go to the emergency department.   Time call ended:  8:43  I provided 13 minutes of non-face-to-face time during this encounter.    Mary-Margaret Hassell Done, FNP

## 2019-05-21 ENCOUNTER — Other Ambulatory Visit: Payer: Self-pay | Admitting: *Deleted

## 2019-05-21 DIAGNOSIS — I10 Essential (primary) hypertension: Secondary | ICD-10-CM

## 2019-05-21 DIAGNOSIS — T7840XS Allergy, unspecified, sequela: Secondary | ICD-10-CM

## 2019-05-21 MED ORDER — AMLODIPINE BESYLATE 2.5 MG PO TABS: 5.0000 mg | ORAL_TABLET | Freq: Every day | ORAL | 0 refills | Status: DC

## 2019-05-21 MED ORDER — FLUTICASONE PROPIONATE 50 MCG/ACT NA SUSP: 2.0000 | Freq: Every day | NASAL | 1 refills | Status: DC

## 2019-06-05 ENCOUNTER — Encounter: Payer: Self-pay | Admitting: Family

## 2019-06-05 ENCOUNTER — Ambulatory Visit (INDEPENDENT_AMBULATORY_CARE_PROVIDER_SITE_OTHER): Payer: Medicare Other | Admitting: Family

## 2019-06-05 DIAGNOSIS — J019 Acute sinusitis, unspecified: Secondary | ICD-10-CM | POA: Diagnosis not present

## 2019-06-05 MED ORDER — AMOXICILLIN-POT CLAVULANATE 875-125 MG PO TABS: 1.0000 | ORAL_TABLET | Freq: Two times a day (BID) | ORAL | 0 refills | Status: DC

## 2019-06-05 NOTE — Progress Notes (Signed)
   Virtual Visit via telephone Note Due to COVID-19 pandemic this visit was conducted virtually. This visit type was conducted due to national recommendations for restrictions regarding the COVID-19 Pandemic (e.g. social distancing, sheltering in place) in an effort to limit this patient's exposure and mitigate transmission in our community. All issues noted in this document were discussed and addressed.  A physical exam was not performed with this format.  I connected with [name] on 06/05/19 at 12:30 pm by telephone and verified that I am speaking with the correct person using two identifiers. Nicole Aguirre is currently located at home and no one is currently with her during visit. The provider, Evelina Dun, FNP is located in their office at time of visit.  I discussed the limitations, risks, security and privacy concerns of performing an evaluation and management service by telephone and the availability of in person appointments. I also discussed with the patient that there may be a patient responsible charge related to this service. The patient expressed understanding and agreed to proceed.   History and Present Illness:  Pt calls the office today with recurrent sinusitis. She reports she called on 05/08/19 and was given prednisone that helped slightly, but now her symptoms are worsening   Sinusitis This is a recurrent problem. The current episode started 1 to 4 weeks ago. The problem has been waxing and waning since onset. There has been no fever. Her pain is at a severity of 2/10. The pain is mild. Associated symptoms include congestion, coughing, ear pain, headaches, sinus pressure and sneezing. Past treatments include oral decongestants and acetaminophen. The treatment provided mild relief.     Review of Systems  HENT: Positive for congestion, ear pain, sinus pressure and sneezing.   Respiratory: Positive for cough.   Neurological: Positive for headaches.  All other  systems reviewed and are negative.    Observations/Objective: No SOB or distress noted   Assessment and Plan: 1. Acute sinusitis, recurrence not specified, unspecified location - Take meds as prescribed - Use a cool mist humidifier  -Use saline nose sprays frequently -Force fluids -For any cough or congestion  Use plain Mucinex- regular strength or max strength is fine -For fever or aces or pains- take tylenol or ibuprofen. -Throat lozenges if help -Call if symptoms worsen or do not improve  - amoxicillin-clavulanate (AUGMENTIN) 875-125 MG tablet; Take 1 tablet by mouth 2 (two) times daily.  Dispense: 14 tablet; Refill: 0    I discussed the assessment and treatment plan with the patient. The patient was provided an opportunity to ask questions and all were answered. The patient agreed with the plan and demonstrated an understanding of the instructions.   The patient was advised to call back or seek an in-person evaluation if the symptoms worsen or if the condition fails to improve as anticipated.  The above assessment and management plan was discussed with the patient. The patient verbalized understanding of and has agreed to the management plan. Patient is aware to call the clinic if symptoms persist or worsen. Patient is aware when to return to the clinic for a follow-up visit. Patient educated on when it is appropriate to go to the emergency department.   Time call ended:  12:45 pm  I provided 15 minutes of non-face-to-face time during this encounter.    Evelina Dun, FNP

## 2019-06-21 ENCOUNTER — Other Ambulatory Visit: Payer: Self-pay | Admitting: Family Medicine

## 2019-06-21 DIAGNOSIS — I10 Essential (primary) hypertension: Secondary | ICD-10-CM

## 2019-08-25 DIAGNOSIS — I10 Essential (primary) hypertension: Secondary | ICD-10-CM | POA: Diagnosis not present

## 2019-08-25 DIAGNOSIS — E782 Mixed hyperlipidemia: Secondary | ICD-10-CM | POA: Diagnosis not present

## 2019-08-25 DIAGNOSIS — I493 Ventricular premature depolarization: Secondary | ICD-10-CM | POA: Diagnosis not present

## 2019-08-26 ENCOUNTER — Other Ambulatory Visit: Payer: Medicare Other

## 2019-08-26 ENCOUNTER — Other Ambulatory Visit: Payer: Self-pay

## 2019-08-26 ENCOUNTER — Other Ambulatory Visit: Payer: Self-pay | Admitting: *Deleted

## 2019-08-26 DIAGNOSIS — K589 Irritable bowel syndrome without diarrhea: Secondary | ICD-10-CM

## 2019-08-26 DIAGNOSIS — I493 Ventricular premature depolarization: Secondary | ICD-10-CM | POA: Diagnosis not present

## 2019-08-26 MED ORDER — HYOSCYAMINE SULFATE 0.125 MG PO TABS: 0.1250 mg | ORAL_TABLET | ORAL | 0 refills | Status: DC | PRN

## 2019-09-01 DIAGNOSIS — I493 Ventricular premature depolarization: Secondary | ICD-10-CM | POA: Diagnosis not present

## 2019-09-01 DIAGNOSIS — I10 Essential (primary) hypertension: Secondary | ICD-10-CM | POA: Diagnosis not present

## 2019-09-09 DIAGNOSIS — I10 Essential (primary) hypertension: Secondary | ICD-10-CM | POA: Diagnosis not present

## 2019-09-09 DIAGNOSIS — I493 Ventricular premature depolarization: Secondary | ICD-10-CM | POA: Diagnosis not present

## 2019-09-12 ENCOUNTER — Other Ambulatory Visit: Payer: Self-pay

## 2019-09-12 ENCOUNTER — Ambulatory Visit (INDEPENDENT_AMBULATORY_CARE_PROVIDER_SITE_OTHER): Payer: Medicare Other | Admitting: Family Medicine

## 2019-09-12 ENCOUNTER — Encounter: Payer: Self-pay | Admitting: Family Medicine

## 2019-09-12 VITALS — BP 133/88 | HR 51 | Temp 97.9°F | Ht 59.0 in | Wt 160.4 lb

## 2019-09-12 DIAGNOSIS — M797 Fibromyalgia: Secondary | ICD-10-CM | POA: Diagnosis not present

## 2019-09-12 DIAGNOSIS — I10 Essential (primary) hypertension: Secondary | ICD-10-CM | POA: Diagnosis not present

## 2019-09-12 DIAGNOSIS — R197 Diarrhea, unspecified: Secondary | ICD-10-CM

## 2019-09-12 DIAGNOSIS — E782 Mixed hyperlipidemia: Secondary | ICD-10-CM

## 2019-09-12 DIAGNOSIS — E538 Deficiency of other specified B group vitamins: Secondary | ICD-10-CM | POA: Diagnosis not present

## 2019-09-12 DIAGNOSIS — K219 Gastro-esophageal reflux disease without esophagitis: Secondary | ICD-10-CM | POA: Diagnosis not present

## 2019-09-12 DIAGNOSIS — T7840XS Allergy, unspecified, sequela: Secondary | ICD-10-CM | POA: Diagnosis not present

## 2019-09-12 MED ORDER — OMEPRAZOLE 40 MG PO CPDR: 40.0000 mg | DELAYED_RELEASE_CAPSULE | Freq: Every day | ORAL | 3 refills | Status: DC

## 2019-09-12 MED ORDER — MONTELUKAST SODIUM 10 MG PO TABS: 10.0000 mg | ORAL_TABLET | Freq: Every day | ORAL | 1 refills | Status: DC

## 2019-09-12 MED ORDER — CETIRIZINE HCL 10 MG PO TABS: 10.0000 mg | ORAL_TABLET | Freq: Every day | ORAL | 1 refills | Status: DC

## 2019-09-12 MED ORDER — ATORVASTATIN CALCIUM 20 MG PO TABS: 20.0000 mg | ORAL_TABLET | Freq: Every day | ORAL | 1 refills | Status: DC

## 2019-09-12 MED ORDER — PREGABALIN 75 MG PO CAPS: 75.0000 mg | ORAL_CAPSULE | Freq: Two times a day (BID) | ORAL | 2 refills | Status: DC

## 2019-09-12 MED ORDER — METOPROLOL SUCCINATE ER 25 MG PO TB24: 12.5000 mg | ORAL_TABLET | Freq: Every day | ORAL | 1 refills | Status: DC

## 2019-09-12 NOTE — Progress Notes (Signed)
Assessment & Plan:  1. Essential hypertension - Well controlled on current regimen.  - metoprolol succinate (TOPROL-XL) 25 MG 24 hr tablet; Take 0.5 tablets (12.5 mg total) by mouth daily.  Dispense: 90 tablet; Refill: 1 - CBC with Differential/Platelet - CMP14+EGFR - Lipid panel  2. Mixed hyperlipidemia - Well controlled on current regimen.  - atorvastatin (LIPITOR) 20 MG tablet; Take 1 tablet (20 mg total) by mouth at bedtime.  Dispense: 90 tablet; Refill: 1 - CBC with Differential/Platelet - CMP14+EGFR - Lipid panel  3. Gastroesophageal reflux disease without esophagitis - Well controlled on current regimen.  - omeprazole (PRILOSEC) 40 MG capsule; Take 1 capsule (40 mg total) by mouth daily.  Dispense: 90 capsule; Refill: 3 - CMP14+EGFR  4. Allergy, sequela - Continue current medications.  - montelukast (SINGULAIR) 10 MG tablet; Take 1 tablet (10 mg total) by mouth daily.  Dispense: 90 tablet; Refill: 1 - cetirizine (ZYRTEC) 10 MG tablet; Take 1 tablet (10 mg total) by mouth daily.  Dispense: 90 tablet; Refill: 1  5. Fibromyalgia - Uncontrolled. Gabapentin D/C'd and Lyrica rx'd.  - pregabalin (LYRICA) 75 MG capsule; Take 1 capsule (75 mg total) by mouth 2 (two) times daily.  Dispense: 60 capsule; Refill: 2 - CMP14+EGFR  6. Diarrhea, unspecified type - Cdiff NAA+O+P+Stool Culture - CBC with Differential/Platelet - CMP14+EGFR - Magnesium  7. B12 deficiency - CBC with Differential/Platelet - Vitamin B12   Return in about 4 weeks (around 10/10/2019) for fibromyalgia.  Nicole Limes, MSN, APRN, FNP-C [facility]  Subjective:    Patient ID: Nicole Aguirre, female    DOB: 1950/12/15, 69 y.o.   MRN: [id number]  Patient Care Team: [name], FNP as PCP - General (Family Medicine)   Chief Complaint:  Chief Complaint  Patient presents with  . Establish Care    Jones pt   . Medical Management of Chronic Issues    check up of  chronic medical conditions  . Nasal Congestion    Patient states it has been going on a few months.  Marland Kitchen all over body pain    Patient states she has been having ongoing body pain and stuffness each day.  . Diarrhea    Has been going on for months and states has it daily.    HPI: Nicole Aguirre is a 69 y.o. female presenting on 09/12/2019 for Establish Care Ronnald Ramp pt ), Medical Management of Chronic Issues (check up of chronic medical conditions), Nasal Congestion (Patient states it has been going on a few months.), all over body pain (Patient states she has been having ongoing body pain and stuffness each day.), and Diarrhea (Has been going on for months and states has it daily.)  Patient reports head congestion due to her allergies which has been going on for the past 5 to 6 months.  She is taking Singulair, Zyrtec, and Flonase on a daily basis.  She complains of feeling sore and stiff all over.  She has a prescription for gabapentin to take for her diagnosis of fibromyalgia but states she is only able to take it once a day as it contributes to her diarrhea.  Patient reports diarrhea has been going on for 1 to 2 years, but has worsened recently.  She is having 5-6 bowel movements in the morning at which time she takes Imodium, which will stop it for the rest of the day.  She does report she has been seeing mucus in her stool lately.   Social  history:  Relevant past medical, surgical, family and social history reviewed and updated as indicated. Interim medical history since our last visit reviewed.  Allergies and medications reviewed and updated.  DATA REVIEWED: CHART IN EPIC  ROS: Negative unless specifically indicated above in HPI.    Current Outpatient Medications:  .  atorvastatin (LIPITOR) 20 MG tablet, Take 1 tablet (20 mg total) by mouth at bedtime., Disp: 90 tablet, Rfl: 3 .  cetirizine (ZYRTEC) 10 MG tablet, Take 1 tablet (10 mg total) by mouth daily., Disp: 90 tablet,  Rfl: 2 .  diclofenac sodium (VOLTAREN) 1 % GEL, Apply 2 g topically 4 (four) times daily., Disp: 150 g, Rfl: 1 .  estradiol (ESTRACE) 0.5 MG tablet, Take 1 tablet (0.5 mg total) by mouth daily., Disp: 90 tablet, Rfl: 3 .  fluticasone (FLONASE) 50 MCG/ACT nasal spray, Place 2 sprays into both nostrils daily., Disp: 48 g, Rfl: 1 .  gabapentin (NEURONTIN) 300 MG capsule, Take 1 capsule (300 mg total) by mouth 2 (two) times daily., Disp: 180 capsule, Rfl: 3 .  hydrochlorothiazide (HYDRODIURIL) 25 MG tablet, Take 0.5 tablets (12.5 mg total) by mouth daily. (Patient taking differently: Take 12.5 mg by mouth daily as needed. ), Disp: 45 tablet, Rfl: 3 .  hyoscyamine (LEVSIN) 0.125 MG tablet, Take 1 tablet (0.125 mg total) by mouth every 4 (four) hours as needed for cramping., Disp: 30 tablet, Rfl: 0 .  metoprolol succinate (TOPROL-XL) 25 MG 24 hr tablet, Take 12.5 mg by mouth daily., Disp: , Rfl:  .  montelukast (SINGULAIR) 10 MG tablet, TAKE 1 TABLET BY MOUTH EVERY DAY, Disp: 90 tablet, Rfl: 3 .  omeprazole (PRILOSEC) 40 MG capsule, Take 1 capsule (40 mg total) by mouth daily., Disp: 90 capsule, Rfl: 3   No Known Allergies Past Medical History:  Diagnosis Date  . Allergy   . Fibromyalgia   . GERD (gastroesophageal reflux disease)   . Hyperlipidemia   . Hypertension   . PVC (premature ventricular contraction)     Past Surgical History:  Procedure Laterality Date  . ABDOMINAL HYSTERECTOMY    . APPENDECTOMY  1985  . CATARACT EXTRACTION, BILATERAL    . CHOLECYSTECTOMY  2010    Social History   Socioeconomic History  . Marital status: Married    Spouse name: [name]  . Number of children: 2  . Years of education: 44  . Highest education level: GED or equivalent  Occupational History  . Occupation: Dietary    Comment: [employer]  Tobacco Use  . Smoking status: Former Smoker    Packs/day: 1.00    Years: 4.00    Pack years: 4.00    Quit date: 07/25/1976    Years since quitting: 43.1  .  Smokeless tobacco: Never Used  Vaping Use  . Vaping Use: Never used  Substance and Sexual Activity  . Alcohol use: Never  . Drug use: Never  . Sexual activity: Yes    Birth control/protection: Surgical  Other Topics Concern  . Not on file  Social History Narrative  . Not on file   Social Determinants of Health   Financial Resource Strain:   . Difficulty of Paying Living Expenses: Not on file  Food Insecurity:   . Worried About Charity fundraiser in the Last Year: Not on file  . Ran Out of Food in the Last Year: Not on file  Transportation Needs:   . Lack of Transportation (Medical): Not on file  . Lack of Transportation (  Non-Medical): Not on file  Physical Activity:   . Days of Exercise per Week: Not on file  . Minutes of Exercise per Session: Not on file  Stress:   . Feeling of Stress : Not on file  Social Connections:   . Frequency of Communication with Friends and Family: Not on file  . Frequency of Social Gatherings with Friends and Family: Not on file  . Attends Religious Services: Not on file  . Active Member of Clubs or Organizations: Not on file  . Attends Archivist Meetings: Not on file  . Marital Status: Not on file  Intimate Partner Violence:   . Fear of Current or Ex-Partner: Not on file  . Emotionally Abused: Not on file  . Physically Abused: Not on file  . Sexually Abused: Not on file        Objective:    BP 133/88   Pulse (!) 51   Temp 97.9 F (36.6 C) (Temporal)   Ht $R'4\' 11"'Sl$  (1.499 m)   Wt 160 lb 6.4 oz (72.8 kg)   SpO2 95%   BMI 32.40 kg/m   Wt Readings from Last 3 Encounters:  09/12/19 160 lb 6.4 oz (72.8 kg)  03/31/19 158 lb (71.7 kg)  07/29/18 154 lb (69.9 kg)    Physical Exam Vitals reviewed.  Constitutional:      General: She is not in acute distress.    Appearance: Normal appearance. She is obese. She is not ill-appearing, toxic-appearing or diaphoretic.  HENT:     Head: Normocephalic and atraumatic.  Eyes:      General: No scleral icterus.       Right eye: No discharge.        Left eye: No discharge.     Conjunctiva/sclera: Conjunctivae normal.  Cardiovascular:     Rate and Rhythm: Normal rate and regular rhythm.     Heart sounds: Normal heart sounds. No murmur heard.  No friction rub. No gallop.   Pulmonary:     Effort: Pulmonary effort is normal. No respiratory distress.     Breath sounds: Normal breath sounds. No stridor. No wheezing, rhonchi or rales.  Musculoskeletal:        General: Normal range of motion.     Cervical back: Normal range of motion.  Skin:    General: Skin is warm and dry.     Capillary Refill: Capillary refill takes less than 2 seconds.  Neurological:     General: No focal deficit present.     Mental Status: She is alert and oriented to person, place, and time. Mental status is at baseline.  Psychiatric:        Mood and Affect: Mood normal.        Behavior: Behavior normal.        Thought Content: Thought content normal.        Judgment: Judgment normal.     Lab Results  Component Value Date   TSH 1.860 07/26/2018   Lab Results  Component Value Date   WBC 4.1 07/26/2018   HGB 12.8 07/26/2018   HCT 38.7 07/26/2018   MCV 90 07/26/2018   PLT 235 07/26/2018   Lab Results  Component Value Date   NA 142 07/26/2018   K 4.0 07/26/2018   CO2 22 07/26/2018   GLUCOSE 81 07/26/2018   BUN 7 (L) 07/26/2018   CREATININE 0.73 07/26/2018   BILITOT 0.4 07/26/2018   ALKPHOS 56 07/26/2018   AST 23 07/26/2018   ALT  16 07/26/2018   PROT 7.2 07/26/2018   ALBUMIN 4.1 07/26/2018   CALCIUM 9.1 07/26/2018   Lab Results  Component Value Date   CHOL 185 03/01/2018   Lab Results  Component Value Date   HDL 79 03/01/2018   Lab Results  Component Value Date   LDLCALC 96 03/01/2018   Lab Results  Component Value Date   TRIG 50 03/01/2018   Lab Results  Component Value Date   CHOLHDL 2.3 03/01/2018   No results found for: HGBA1C

## 2019-09-13 LAB — CMP14+EGFR
ALT: 10 IU/L (ref 0–32)
AST: 18 IU/L (ref 0–40)
Albumin/Globulin Ratio: 1.6 (ref 1.2–2.2)
Albumin: 4.2 g/dL (ref 3.8–4.8)
Alkaline Phosphatase: 58 IU/L (ref 48–121)
BUN/Creatinine Ratio: 15 (ref 12–28)
BUN: 11 mg/dL (ref 8–27)
Bilirubin Total: 0.3 mg/dL (ref 0.0–1.2)
CO2: 27 mmol/L (ref 20–29)
Calcium: 9 mg/dL (ref 8.7–10.3)
Chloride: 105 mmol/L (ref 96–106)
Creatinine, Ser: 0.75 mg/dL (ref 0.57–1.00)
GFR calc Af Amer: 95 mL/min/{1.73_m2} (ref >59)
GFR calc non Af Amer: 83 mL/min/{1.73_m2} (ref >59)
Globulin, Total: 2.7 g/dL (ref 1.5–4.5)
Glucose: 80 mg/dL (ref 65–99)
Potassium: 4.6 mmol/L (ref 3.5–5.2)
Sodium: 140 mmol/L (ref 134–144)
Total Protein: 6.9 g/dL (ref 6.0–8.5)

## 2019-09-13 LAB — CBC WITH DIFFERENTIAL/PLATELET
Basophils Absolute: 0 10*3/uL (ref 0.0–0.2)
Basos: 1 %
EOS (ABSOLUTE): 0.2 10*3/uL (ref 0.0–0.4)
Eos: 4 %
Hematocrit: 41.2 % (ref 34.0–46.6)
Hemoglobin: 14.1 g/dL (ref 11.1–15.9)
Immature Grans (Abs): 0 10*3/uL (ref 0.0–0.1)
Immature Granulocytes: 0 %
Lymphocytes Absolute: 1.2 10*3/uL (ref 0.7–3.1)
Lymphs: 33 %
MCH: 30.9 pg (ref 26.6–33.0)
MCHC: 34.2 g/dL (ref 31.5–35.7)
MCV: 90 fL (ref 79–97)
Monocytes Absolute: 0.3 10*3/uL (ref 0.1–0.9)
Monocytes: 7 %
Neutrophils Absolute: 2 10*3/uL (ref 1.4–7.0)
Neutrophils: 55 %
Platelets: 237 10*3/uL (ref 150–450)
RBC: 4.57 x10E6/uL (ref 3.77–5.28)
RDW: 12.6 % (ref 11.7–15.4)
WBC: 3.7 10*3/uL (ref 3.4–10.8)

## 2019-09-13 LAB — LIPID PANEL
Chol/HDL Ratio: 2.9 ratio (ref 0.0–4.4)
Cholesterol, Total: 186 mg/dL (ref 100–199)
HDL: 64 mg/dL (ref >39)
LDL Chol Calc (NIH): 101 mg/dL — ABNORMAL HIGH (ref 0–99)
Triglycerides: 121 mg/dL (ref 0–149)
VLDL Cholesterol Cal: 21 mg/dL (ref 5–40)

## 2019-09-13 LAB — VITAMIN B12: Vitamin B-12: 262 pg/mL (ref 232–1245)

## 2019-09-13 LAB — MAGNESIUM: Magnesium: 1.8 mg/dL (ref 1.6–2.3)

## 2019-09-15 ENCOUNTER — Other Ambulatory Visit: Payer: Self-pay

## 2019-09-15 ENCOUNTER — Other Ambulatory Visit: Payer: Medicare Other

## 2019-09-15 DIAGNOSIS — R197 Diarrhea, unspecified: Secondary | ICD-10-CM | POA: Diagnosis not present

## 2019-09-23 LAB — CDIFF NAA+O+P+STOOL CULTURE
E coli, Shiga toxin Assay: NEGATIVE
Toxigenic C. Difficile by PCR: NEGATIVE

## 2019-09-24 DIAGNOSIS — I517 Cardiomegaly: Secondary | ICD-10-CM | POA: Diagnosis not present

## 2019-09-24 DIAGNOSIS — I493 Ventricular premature depolarization: Secondary | ICD-10-CM | POA: Diagnosis not present

## 2019-10-07 ENCOUNTER — Ambulatory Visit: Payer: Medicare Other

## 2019-10-14 ENCOUNTER — Other Ambulatory Visit: Payer: Self-pay | Admitting: Family Medicine

## 2019-10-14 ENCOUNTER — Encounter: Payer: Self-pay | Admitting: Family Medicine

## 2019-10-14 ENCOUNTER — Ambulatory Visit (INDEPENDENT_AMBULATORY_CARE_PROVIDER_SITE_OTHER): Payer: Medicare Other | Admitting: Family Medicine

## 2019-10-14 DIAGNOSIS — T7840XS Allergy, unspecified, sequela: Secondary | ICD-10-CM

## 2019-10-14 DIAGNOSIS — M797 Fibromyalgia: Secondary | ICD-10-CM

## 2019-10-14 MED ORDER — SAVELLA 25 MG PO TABS: ORAL_TABLET | ORAL | 2 refills | Status: DC

## 2019-10-14 NOTE — Progress Notes (Signed)
Virtual Visit via Telephone Note  I connected with [name] on 10/14/19 at 9:07 AM by telephone and verified that I am speaking with the correct person using two identifiers. Nicole Aguirre is currently located at home and her husband is currently with her during this visit. The provider, [name], FNP is located in their office at time of visit.  I discussed the limitations, risks, security and privacy concerns of performing an evaluation and management service by telephone and the availability of in person appointments. I also discussed with the patient that there may be a patient responsible charge related to this service. The patient expressed understanding and agreed to proceed.  Subjective: PCP: [name], FNP  Chief Complaint  Patient presents with  . Fibromyalgia   Patient is following up on her fibromyalgia.  At her last visit she was prescribed Lyrica but states she could not take it as it made her feel high.  She did not go back to taking the gabapentin either.  She has also failed therapy with Cymbalta, Mobic, and Celebrex.  She reports the only medication that has ever helped her is Ocie Cornfield which she had to stop taking as insurance would not cover it.   ROS: Per HPI  Current Outpatient Medications:  .  atorvastatin (LIPITOR) 20 MG tablet, Take 1 tablet (20 mg total) by mouth at bedtime., Disp: 90 tablet, Rfl: 1 .  cetirizine (ZYRTEC) 10 MG tablet, Take 1 tablet (10 mg total) by mouth daily., Disp: 90 tablet, Rfl: 1 .  diclofenac sodium (VOLTAREN) 1 % GEL, Apply 2 g topically 4 (four) times daily., Disp: 150 g, Rfl: 1 .  fluticasone (FLONASE) 50 MCG/ACT nasal spray, Place 2 sprays into both nostrils daily., Disp: 48 g, Rfl: 1 .  hydrochlorothiazide (HYDRODIURIL) 25 MG tablet, Take 0.5 tablets (12.5 mg total) by mouth daily. (Patient taking differently: Take 12.5 mg by mouth daily as needed. ), Disp: 45 tablet, Rfl: 3 .  hyoscyamine (LEVSIN) 0.125  MG tablet, Take 1 tablet (0.125 mg total) by mouth every 4 (four) hours as needed for cramping., Disp: 30 tablet, Rfl: 0 .  magnesium oxide (MAG-OX) 400 MG tablet, Take 200 mg by mouth daily., Disp: , Rfl:  .  metoprolol succinate (TOPROL-XL) 25 MG 24 hr tablet, Take 0.5 tablets (12.5 mg total) by mouth daily., Disp: 90 tablet, Rfl: 1 .  montelukast (SINGULAIR) 10 MG tablet, Take 1 tablet (10 mg total) by mouth daily., Disp: 90 tablet, Rfl: 1 .  omeprazole (PRILOSEC) 40 MG capsule, Take 1 capsule (40 mg total) by mouth daily., Disp: 90 capsule, Rfl: 3 .  pregabalin (LYRICA) 75 MG capsule, Take 1 capsule (75 mg total) by mouth 2 (two) times daily., Disp: 60 capsule, Rfl: 2  No Known Allergies Past Medical History:  Diagnosis Date  . Allergy   . Fibromyalgia   . GERD (gastroesophageal reflux disease)   . Hyperlipidemia   . Hypertension   . PVC (premature ventricular contraction)     Observations/Objective: A&O  No respiratory distress or wheezing audible over the phone Mood, judgement, and thought processes all WNL  Assessment and Plan: 1. Fibromyalgia - Milnacipran HCl (SAVELLA) 25 MG TABS; Take 12.5 mg (1/2 tablet) by mouth once today, then 12.5 mg twice daily x2 days, then 25 mg twice daily.  Dispense: 60 tablet; Refill: 2   Follow Up Instructions: Return in about 2 weeks (around 10/28/2019) for Fibromyalgia (telephone).  I discussed the assessment and treatment plan  with the patient. The patient was provided an opportunity to ask questions and all were answered. The patient agreed with the plan and demonstrated an understanding of the instructions.   The patient was advised to call back or seek an in-person evaluation if the symptoms worsen or if the condition fails to improve as anticipated.  The above assessment and management plan was discussed with the patient. The patient verbalized understanding of and has agreed to the management plan. Patient is aware to call the clinic  if symptoms persist or worsen. Patient is aware when to return to the clinic for a follow-up visit. Patient educated on when it is appropriate to go to the emergency department.   Time call ended: 9:16 AM  I provided 11 minutes of non-face-to-face time during this encounter.  [name], MSN, APRN, FNP-C [facility] 10/14/19

## 2019-10-24 ENCOUNTER — Ambulatory Visit (INDEPENDENT_AMBULATORY_CARE_PROVIDER_SITE_OTHER): Payer: Medicare Other

## 2019-10-24 ENCOUNTER — Other Ambulatory Visit: Payer: Self-pay

## 2019-10-24 DIAGNOSIS — Z23 Encounter for immunization: Secondary | ICD-10-CM

## 2019-10-28 ENCOUNTER — Encounter: Payer: Self-pay | Admitting: Family Medicine

## 2019-10-28 ENCOUNTER — Ambulatory Visit (INDEPENDENT_AMBULATORY_CARE_PROVIDER_SITE_OTHER): Payer: Medicare Other | Admitting: Family Medicine

## 2019-10-28 DIAGNOSIS — M797 Fibromyalgia: Secondary | ICD-10-CM | POA: Diagnosis not present

## 2019-10-28 MED ORDER — SAVELLA 25 MG PO TABS: 25.0000 mg | ORAL_TABLET | Freq: Two times a day (BID) | ORAL | 1 refills | Status: DC

## 2019-10-28 NOTE — Progress Notes (Signed)
Virtual Visit via Telephone Note  I connected with [name] on 10/28/19 at 8:35 AM by telephone and verified that I am speaking with the correct person using two identifiers. Nicole Aguirre is currently located at home and her husband is currently with her during this visit. The provider, [name], FNP is located in their home at time of visit.  I discussed the limitations, risks, security and privacy concerns of performing an evaluation and management service by telephone and the availability of in person appointments. I also discussed with the patient that there may be a patient responsible charge related to this service. The patient expressed understanding and agreed to proceed.  Subjective: PCP: [name], FNP  Chief Complaint  Patient presents with  . Fibromyalgia   Patient is following up on her fibromyalgia.  She was recently started on Savella.  She reports today that she was able to get it with her insurance and that it is helping at the 25 mg twice daily dosage.   ROS: Per HPI  Current Outpatient Medications:  .  atorvastatin (LIPITOR) 20 MG tablet, Take 1 tablet (20 mg total) by mouth at bedtime., Disp: 90 tablet, Rfl: 1 .  cetirizine (ZYRTEC) 10 MG tablet, Take 1 tablet (10 mg total) by mouth daily., Disp: 90 tablet, Rfl: 1 .  diclofenac sodium (VOLTAREN) 1 % GEL, Apply 2 g topically 4 (four) times daily., Disp: 150 g, Rfl: 1 .  fluticasone (FLONASE) 50 MCG/ACT nasal spray, USE 2 SPRAYS IN BOTH  NOSTRILS DAILY, Disp: 48 g, Rfl: 1 .  hydrochlorothiazide (HYDRODIURIL) 25 MG tablet, Take 0.5 tablets (12.5 mg total) by mouth daily. (Patient taking differently: Take 12.5 mg by mouth daily as needed. ), Disp: 45 tablet, Rfl: 3 .  hyoscyamine (LEVSIN) 0.125 MG tablet, Take 1 tablet (0.125 mg total) by mouth every 4 (four) hours as needed for cramping., Disp: 30 tablet, Rfl: 0 .  magnesium oxide (MAG-OX) 400 MG tablet, Take 200 mg by mouth daily., Disp:  , Rfl:  .  metoprolol succinate (TOPROL-XL) 25 MG 24 hr tablet, Take 0.5 tablets (12.5 mg total) by mouth daily., Disp: 90 tablet, Rfl: 1 .  Milnacipran HCl (SAVELLA) 25 MG TABS, Take 12.5 mg (1/2 tablet) by mouth once today, then 12.5 mg twice daily x2 days, then 25 mg twice daily., Disp: 60 tablet, Rfl: 2 .  montelukast (SINGULAIR) 10 MG tablet, Take 1 tablet (10 mg total) by mouth daily., Disp: 90 tablet, Rfl: 1 .  omeprazole (PRILOSEC) 40 MG capsule, Take 1 capsule (40 mg total) by mouth daily., Disp: 90 capsule, Rfl: 3  No Known Allergies Past Medical History:  Diagnosis Date  . Allergy   . Fibromyalgia   . GERD (gastroesophageal reflux disease)   . Hyperlipidemia   . Hypertension   . PVC (premature ventricular contraction)     Observations/Objective: A&O  No respiratory distress or wheezing audible over the phone Mood, judgement, and thought processes all WNL   Assessment and Plan: 1. Fibromyalgia - Well controlled on current regimen.  - Milnacipran HCl (SAVELLA) 25 MG TABS; Take 1 tablet (25 mg total) by mouth 2 (two) times daily.  Dispense: 180 tablet; Refill: 1   Follow Up Instructions: Return in about 3 months (around 01/28/2020) for follow-up of chronic medication conditions.  I discussed the assessment and treatment plan with the patient. The patient was provided an opportunity to ask questions and all were answered. The patient agreed with the plan and  demonstrated an understanding of the instructions.   The patient was advised to call back or seek an in-person evaluation if the symptoms worsen or if the condition fails to improve as anticipated.  The above assessment and management plan was discussed with the patient. The patient verbalized understanding of and has agreed to the management plan. Patient is aware to call the clinic if symptoms persist or worsen. Patient is aware when to return to the clinic for a follow-up visit. Patient educated on when it is  appropriate to go to the emergency department.   Time call ended: 8:43 AM  I provided 10 minutes of non-face-to-face time during this encounter.  [name], MSN, APRN, FNP-C [facility] 10/28/19

## 2019-11-17 ENCOUNTER — Other Ambulatory Visit: Payer: Self-pay | Admitting: Family Medicine

## 2019-11-25 DIAGNOSIS — I493 Ventricular premature depolarization: Secondary | ICD-10-CM | POA: Diagnosis not present

## 2019-11-25 DIAGNOSIS — I10 Essential (primary) hypertension: Secondary | ICD-10-CM | POA: Diagnosis not present

## 2019-11-25 DIAGNOSIS — E782 Mixed hyperlipidemia: Secondary | ICD-10-CM | POA: Diagnosis not present

## 2019-12-03 ENCOUNTER — Other Ambulatory Visit: Payer: Self-pay | Admitting: Family Medicine

## 2019-12-10 ENCOUNTER — Other Ambulatory Visit: Payer: Self-pay | Admitting: *Deleted

## 2019-12-24 ENCOUNTER — Other Ambulatory Visit: Payer: Self-pay | Admitting: *Deleted

## 2019-12-24 MED ORDER — DICLOFENAC SODIUM 1 % EX GEL: 2.0000 g | Freq: Four times a day (QID) | CUTANEOUS | 3 refills | Status: DC

## 2019-12-26 ENCOUNTER — Telehealth: Payer: Self-pay | Admitting: *Deleted

## 2019-12-26 NOTE — Telephone Encounter (Signed)
Fax from [facility] Request for Estradiol tab 0.5 mg 1 QD Not on current med list Last OV 10/28/19 Next OV 01/28/20

## 2019-12-29 NOTE — Telephone Encounter (Signed)
Needs appointment to discuss, you can give her 1 month worth of this to get her through to her next appointment.  Looks like it was a historical med and ankle joints prescribed previously.

## 2019-12-29 NOTE — Telephone Encounter (Signed)
Attempted to contact patient - NA When calls back - schedule appointment and then we can send in 1 month supply per Dr. Warrick Parisian

## 2020-01-07 NOTE — Telephone Encounter (Signed)
Has appointment scheduled for 01/28/20

## 2020-01-28 ENCOUNTER — Ambulatory Visit: Payer: Medicare Other | Admitting: Family Medicine

## 2020-02-09 ENCOUNTER — Other Ambulatory Visit: Payer: Self-pay | Admitting: Family Medicine

## 2020-02-09 DIAGNOSIS — T7840XS Allergy, unspecified, sequela: Secondary | ICD-10-CM

## 2020-02-09 DIAGNOSIS — E782 Mixed hyperlipidemia: Secondary | ICD-10-CM

## 2020-02-17 ENCOUNTER — Ambulatory Visit (INDEPENDENT_AMBULATORY_CARE_PROVIDER_SITE_OTHER): Payer: Medicare Other | Admitting: Family Medicine

## 2020-02-17 ENCOUNTER — Ambulatory Visit (INDEPENDENT_AMBULATORY_CARE_PROVIDER_SITE_OTHER): Payer: Medicare Other

## 2020-02-17 ENCOUNTER — Encounter: Payer: Self-pay | Admitting: Family Medicine

## 2020-02-17 ENCOUNTER — Other Ambulatory Visit: Payer: Self-pay

## 2020-02-17 VITALS — BP 164/92 | HR 69 | Temp 97.7°F | Ht 59.0 in | Wt 162.2 lb

## 2020-02-17 DIAGNOSIS — M797 Fibromyalgia: Secondary | ICD-10-CM | POA: Diagnosis not present

## 2020-02-17 DIAGNOSIS — K219 Gastro-esophageal reflux disease without esophagitis: Secondary | ICD-10-CM

## 2020-02-17 DIAGNOSIS — Z23 Encounter for immunization: Secondary | ICD-10-CM | POA: Diagnosis not present

## 2020-02-17 DIAGNOSIS — Z780 Asymptomatic menopausal state: Secondary | ICD-10-CM

## 2020-02-17 DIAGNOSIS — M79641 Pain in right hand: Secondary | ICD-10-CM | POA: Diagnosis not present

## 2020-02-17 DIAGNOSIS — E538 Deficiency of other specified B group vitamins: Secondary | ICD-10-CM | POA: Diagnosis not present

## 2020-02-17 DIAGNOSIS — I10 Essential (primary) hypertension: Secondary | ICD-10-CM | POA: Diagnosis not present

## 2020-02-17 DIAGNOSIS — E782 Mixed hyperlipidemia: Secondary | ICD-10-CM | POA: Diagnosis not present

## 2020-02-17 MED ORDER — DULOXETINE HCL 30 MG PO CPEP: 30.0000 mg | ORAL_CAPSULE | Freq: Every day | ORAL | 2 refills | Status: DC

## 2020-02-17 NOTE — Patient Instructions (Signed)
Monitor your blood pressure at home and keep a log to bring with you to your next appointment. Start taking your hydrochlorothiazide daily instead of as needed for swelling.  Take Advil 2-3 times a day to help with right knee pain.  DASH Eating Plan DASH stands for Dietary Approaches to Stop Hypertension. The DASH eating plan is a healthy eating plan that has been shown to:  Reduce high blood pressure (hypertension).  Reduce your risk for type 2 diabetes, heart disease, and stroke.  Help with weight loss. What are tips for following this plan? Reading food labels  Check food labels for the amount of salt (sodium) per serving. Choose foods with less than 5 percent of the Daily Value of sodium. Generally, foods with less than 300 milligrams (mg) of sodium per serving fit into this eating plan.  To find whole grains, look for the word "whole" as the first word in the ingredient list. Shopping  Buy products labeled as "low-sodium" or "no salt added."  Buy fresh foods. Avoid canned foods and pre-made or frozen meals. Cooking  Avoid adding salt when cooking. Use salt-free seasonings or herbs instead of table salt or sea salt. Check with your health care provider or pharmacist before using salt substitutes.  Do not fry foods. Cook foods using healthy methods such as baking, boiling, grilling, roasting, and broiling instead.  Cook with heart-healthy oils, such as olive, canola, avocado, soybean, or sunflower oil. Meal planning  Eat a balanced diet that includes: ? 4 or more servings of fruits and 4 or more servings of vegetables each day. Try to fill one-half of your plate with fruits and vegetables. ? 6-8 servings of whole grains each day. ? Less than 6 oz (170 g) of lean meat, poultry, or fish each day. A 3-oz (85-g) serving of meat is about the same size as a deck of cards. One egg equals 1 oz (28 g). ? 2-3 servings of low-fat dairy each day. One serving is 1 cup (237 mL). ? 1  serving of nuts, seeds, or beans 5 times each week. ? 2-3 servings of heart-healthy fats. Healthy fats called omega-3 fatty acids are found in foods such as walnuts, flaxseeds, fortified milks, and eggs. These fats are also found in cold-water fish, such as sardines, salmon, and mackerel.  Limit how much you eat of: ? Canned or prepackaged foods. ? Food that is high in trans fat, such as some fried foods. ? Food that is high in saturated fat, such as fatty meat. ? Desserts and other sweets, sugary drinks, and other foods with added sugar. ? Full-fat dairy products.  Do not salt foods before eating.  Do not eat more than 4 egg yolks a week.  Try to eat at least 2 vegetarian meals a week.  Eat more home-cooked food and less restaurant, buffet, and fast food.   Lifestyle  When eating at a restaurant, ask that your food be prepared with less salt or no salt, if possible.  If you drink alcohol: ? Limit how much you use to:  0-1 drink a day for women who are not pregnant.  0-2 drinks a day for men. ? Be aware of how much alcohol is in your drink. In the [place] one drink equals one 12 oz bottle of beer (355 mL), one 5 oz glass of wine (148 mL), or one 1 oz glass of hard liquor (44 mL). General information  Avoid eating more than 2,300 mg of salt a day.  If you have hypertension, you may need to reduce your sodium intake to 1,500 mg a day.  Work with your health care provider to maintain a healthy body weight or to lose weight. Ask what an ideal weight is for you.  Get at least 30 minutes of exercise that causes your heart to beat faster (aerobic exercise) most days of the week. Activities may include walking, swimming, or biking.  Work with your health care provider or dietitian to adjust your eating plan to your individual calorie needs. What foods should I eat? Fruits All fresh, dried, or frozen fruit. Canned fruit in natural juice (without added sugar). Vegetables Fresh or frozen  vegetables (raw, steamed, roasted, or grilled). Low-sodium or reduced-sodium tomato and vegetable juice. Low-sodium or reduced-sodium tomato sauce and tomato paste. Low-sodium or reduced-sodium canned vegetables. Grains Whole-grain or whole-wheat bread. Whole-grain or whole-wheat pasta. Brown rice. [place]. Bulgur. Whole-grain and low-sodium cereals. Pita bread. Low-fat, low-sodium crackers. Whole-wheat flour tortillas. Meats and other proteins Skinless chicken or [place]. Ground chicken or [place]. Pork with fat trimmed off. Fish and seafood. Egg whites. Dried beans, peas, or lentils. Unsalted nuts, nut butters, and seeds. Unsalted canned beans. Lean cuts of beef with fat trimmed off. Low-sodium, lean precooked or cured meat, such as sausages or meat loaves. Dairy Low-fat (1%) or fat-free (skim) milk. Reduced-fat, low-fat, or fat-free cheeses. Nonfat, low-sodium ricotta or cottage cheese. Low-fat or nonfat yogurt. Low-fat, low-sodium cheese. Fats and oils Soft margarine without trans fats. Vegetable oil. Reduced-fat, low-fat, or light mayonnaise and salad dressings (reduced-sodium). Canola, safflower, olive, avocado, soybean, and sunflower oils. Avocado. Seasonings and condiments Herbs. Spices. Seasoning mixes without salt. Other foods Unsalted popcorn and pretzels. Fat-free sweets. The items listed above may not be a complete list of foods and beverages you can eat. Contact a dietitian for more information. What foods should I avoid? Fruits Canned fruit in a light or heavy syrup. Fried fruit. Fruit in cream or butter sauce. Vegetables Creamed or fried vegetables. Vegetables in a cheese sauce. Regular canned vegetables (not low-sodium or reduced-sodium). Regular canned tomato sauce and paste (not low-sodium or reduced-sodium). Regular tomato and vegetable juice (not low-sodium or reduced-sodium). Angie Fava. Olives. Grains Baked goods made with fat, such as croissants, muffins, or some  breads. Dry pasta or rice meal packs. Meats and other proteins Fatty cuts of meat. Ribs. Fried meat. Berniece Salines. Bologna, salami, and other precooked or cured meats, such as sausages or meat loaves. Fat from the back of a pig (fatback). Bratwurst. Salted nuts and seeds. Canned beans with added salt. Canned or smoked fish. Whole eggs or egg yolks. Chicken or [place] with skin. Dairy Whole or 2% milk, cream, and half-and-half. Whole or full-fat cream cheese. Whole-fat or sweetened yogurt. Full-fat cheese. Nondairy creamers. Whipped toppings. Processed cheese and cheese spreads. Fats and oils Butter. Stick margarine. Lard. Shortening. Ghee. Bacon fat. Tropical oils, such as coconut, palm kernel, or palm oil. Seasonings and condiments Onion salt, garlic salt, seasoned salt, table salt, and sea salt. Worcestershire sauce. Tartar sauce. Barbecue sauce. Teriyaki sauce. Soy sauce, including reduced-sodium. Steak sauce. Canned and packaged gravies. Fish sauce. Oyster sauce. Cocktail sauce. Store-bought horseradish. Ketchup. Mustard. Meat flavorings and tenderizers. Bouillon cubes. Hot sauces. Pre-made or packaged marinades. Pre-made or packaged taco seasonings. Relishes. Regular salad dressings. Other foods Salted popcorn and pretzels. The items listed above may not be a complete list of foods and beverages you should avoid. Contact a dietitian for more information. Where to find more information  National Heart, Lung, and Blood Institute: https://wilson-eaton.com/  American Heart Association: www.heart.org  Academy of Nutrition and Dietetics: www.eatright.[place]: www.kidney.org Summary  The DASH eating plan is a healthy eating plan that has been shown to reduce high blood pressure (hypertension). It may also reduce your risk for type 2 diabetes, heart disease, and stroke.  When on the DASH eating plan, aim to eat more fresh fruits and vegetables, whole grains, lean proteins, low-fat  dairy, and heart-healthy fats.  With the DASH eating plan, you should limit salt (sodium) intake to 2,300 mg a day. If you have hypertension, you may need to reduce your sodium intake to 1,500 mg a day.  Work with your health care provider or dietitian to adjust your eating plan to your individual calorie needs. This information is not intended to replace advice given to you by your health care provider. Make sure you discuss any questions you have with your health care provider. Document Revised: 12/06/2018 Document Reviewed: 12/06/2018 Elsevier Patient Education  2021 [employer].

## 2020-02-17 NOTE — Progress Notes (Signed)
Assessment & Plan:  1. Essential hypertension - Uncontrolled. Patient is going to start taking the hydrochlorothiazide once daily instead of as needed to help control her blood pressure. Encourage diet and exercise. Patient to monitor her blood pressure at home and keep a log to bring with her to her next appointment. Education provided on the DASH diet. - CMP14+EGFR - Lipid panel (patient did have coffee with sugar this morning)  2. Mixed hyperlipidemia - Labs to assess. - CMP14+EGFR - Lipid panel (patient did have coffee with sugar this morning)  3. Gastroesophageal reflux disease without esophagitis - Well controlled on current regimen.  - CMP14+EGFR  4. Fibromyalgia - Uncontrolled. Previously failed therapy with gabapentin, Lyrica, and Savella. Rx'd Cymbalta today. - CMP14+EGFR - DULoxetine (CYMBALTA) 30 MG capsule; Take 1 capsule (30 mg total) by mouth daily.  Dispense: 30 capsule; Refill: 2  5. B12 deficiency - Well controlled on current regimen.   6. Postmenopausal estrogen deficiency - DG [facility] DEXA  7. Right hand pain - Encouraged to take Advil 2-3 times a day scheduled to stay on top of the pain and see if she can improve it that way.  8. Immunization due - Pneumococcal conjugate vaccine 13-valent given in office   Return in about 6 weeks (around 03/30/2020) for Fibromyalgia & HTN.  Nicole Limes, MSN, APRN, FNP-C [facility]  Subjective:    Patient ID: Nicole Aguirre, female    DOB: 05-15-1950, 70 y.o.   MRN: [id number]  Patient Care Team: [name], FNP as PCP - General (Family Medicine)   Chief Complaint:  Chief Complaint  Patient presents with  . Hypertension    Check up of chronic medical conditions     HPI: Nicole Aguirre is a 70 y.o. female presenting on 02/17/2020 for Hypertension (Check up of chronic medical conditions/)  Hypertension: Patient reports blood pressure readings have been elevated at home.   She is only taking hydrochlorothiazide as needed for swelling.  Her cardiologist suggested increasing her metoprolol from half tablet to a whole tablet, but she has not done so as she is afraid of it dropping her heart rate too low.  Fibromyalgia: Patient quit taking Savella as she did not feel it was helping her fibromyalgia pain.  She has also failed therapy with Lyrica and gabapentin.  B12 Deficiency: normal B12 level five months ago.  New complaints: Patient reports right hand pain for the past 1 to 2 months.  She describes it as sore and stiff, especially at the end of the day.  She has been applying Voltaren gel and taking Advil which has been help  Social history:  Relevant past medical, surgical, family and social history reviewed and updated as indicated. Interim medical history since our last visit reviewed.  Allergies and medications reviewed and updated.  DATA REVIEWED: CHART IN EPIC  ROS: Negative unless specifically indicated above in HPI.    Current Outpatient Medications:  .  atorvastatin (LIPITOR) 20 MG tablet, TAKE 1 TABLET BY MOUTH AT  BEDTIME, Disp: 90 tablet, Rfl: 0 .  cetirizine (ZYRTEC) 10 MG tablet, Take 1 tablet (10 mg total) by mouth daily., Disp: 90 tablet, Rfl: 1 .  diclofenac Sodium (VOLTAREN) 1 % GEL, Apply 2 g topically 4 (four) times daily., Disp: 150 g, Rfl: 3 .  fluticasone (FLONASE) 50 MCG/ACT nasal spray, USE 2 SPRAYS IN BOTH  NOSTRILS DAILY, Disp: 48 g, Rfl: 1 .  hydrochlorothiazide (HYDRODIURIL) 25 MG tablet, Take 0.5 tablets (12.5 mg  total) by mouth daily. (Patient taking differently: Take 12.5 mg by mouth daily as needed.), Disp: 45 tablet, Rfl: 3 .  hyoscyamine (LEVSIN) 0.125 MG tablet, Take 1 tablet (0.125 mg total) by mouth every 4 (four) hours as needed for cramping., Disp: 30 tablet, Rfl: 0 .  magnesium oxide (MAG-OX) 400 MG tablet, Take 200 mg by mouth daily., Disp: , Rfl:  .  metoprolol succinate (TOPROL-XL) 25 MG 24 hr tablet, Take 0.5  tablets (12.5 mg total) by mouth daily., Disp: 90 tablet, Rfl: 1 .  montelukast (SINGULAIR) 10 MG tablet, TAKE 1 TABLET BY MOUTH  DAILY, Disp: 90 tablet, Rfl: 0 .  omeprazole (PRILOSEC) 40 MG capsule, Take 1 capsule (40 mg total) by mouth daily., Disp: 90 capsule, Rfl: 3   No Known Allergies Past Medical History:  Diagnosis Date  . Allergy   . Fibromyalgia   . GERD (gastroesophageal reflux disease)   . Hyperlipidemia   . Hypertension   . PVC (premature ventricular contraction)     Past Surgical History:  Procedure Laterality Date  . ABDOMINAL HYSTERECTOMY    . APPENDECTOMY  1985  . CATARACT EXTRACTION, BILATERAL    . CHOLECYSTECTOMY  2010    Social History   Socioeconomic History  . Marital status: Married    Spouse name: [name]  . Number of children: 2  . Years of education: 6  . Highest education level: GED or equivalent  Occupational History  . Occupation: Dietary    Comment: [employer]  Tobacco Use  . Smoking status: Former Smoker    Packs/day: 1.00    Years: 4.00    Pack years: 4.00    Quit date: 07/25/1976    Years since quitting: 43.5  . Smokeless tobacco: Never Used  Vaping Use  . Vaping Use: Never used  Substance and Sexual Activity  . Alcohol use: Never  . Drug use: Never  . Sexual activity: Yes    Birth control/protection: Surgical  Other Topics Concern  . Not on file  Social History Narrative  . Not on file   Social Determinants of Health   Financial Resource Strain: Not on file  Food Insecurity: Not on file  Transportation Needs: Not on file  Physical Activity: Not on file  Stress: Not on file  Social Connections: Not on file  Intimate Partner Violence: Not on file        Objective:    Pulse 69   Temp 97.7 F (36.5 C) (Temporal)   Ht $R'4\' 11"'cT$  (1.499 m)   Wt 162 lb 3.2 oz (73.6 kg)   SpO2 93%   BMI 32.76 kg/m   Wt Readings from Last 3 Encounters:  02/17/20 162 lb 3.2 oz (73.6 kg)  09/12/19 160 lb 6.4 oz (72.8 kg)  03/31/19 158 lb  (71.7 kg)    Physical Exam Vitals reviewed.  Constitutional:      General: She is not in acute distress.    Appearance: Normal appearance. She is obese. She is not ill-appearing, toxic-appearing or diaphoretic.  HENT:     Head: Normocephalic and atraumatic.  Eyes:     General: No scleral icterus.       Right eye: No discharge.        Left eye: No discharge.     Conjunctiva/sclera: Conjunctivae normal.  Cardiovascular:     Rate and Rhythm: Normal rate and regular rhythm.     Heart sounds: Normal heart sounds. No murmur heard. No friction rub. No gallop.  Pulmonary:     Effort: Pulmonary effort is normal. No respiratory distress.     Breath sounds: Normal breath sounds. No stridor. No wheezing, rhonchi or rales.  Musculoskeletal:        General: Normal range of motion.     Cervical back: Normal range of motion.  Skin:    General: Skin is warm and dry.     Capillary Refill: Capillary refill takes less than 2 seconds.  Neurological:     General: No focal deficit present.     Mental Status: She is alert and oriented to person, place, and time. Mental status is at baseline.  Psychiatric:        Mood and Affect: Mood normal.        Behavior: Behavior normal.        Thought Content: Thought content normal.        Judgment: Judgment normal.     Lab Results  Component Value Date   TSH 1.860 07/26/2018   Lab Results  Component Value Date   WBC 3.7 09/12/2019   HGB 14.1 09/12/2019   HCT 41.2 09/12/2019   MCV 90 09/12/2019   PLT 237 09/12/2019   Lab Results  Component Value Date   NA 140 09/12/2019   K 4.6 09/12/2019   CO2 27 09/12/2019   GLUCOSE 80 09/12/2019   BUN 11 09/12/2019   CREATININE 0.75 09/12/2019   BILITOT 0.3 09/12/2019   ALKPHOS 58 09/12/2019   AST 18 09/12/2019   ALT 10 09/12/2019   PROT 6.9 09/12/2019   ALBUMIN 4.2 09/12/2019   CALCIUM 9.0 09/12/2019   Lab Results  Component Value Date   CHOL 186 09/12/2019   Lab Results  Component Value  Date   HDL 64 09/12/2019   Lab Results  Component Value Date   LDLCALC 101 (H) 09/12/2019   Lab Results  Component Value Date   TRIG 121 09/12/2019   Lab Results  Component Value Date   CHOLHDL 2.9 09/12/2019   No results found for: HGBA1C

## 2020-02-18 ENCOUNTER — Other Ambulatory Visit: Payer: Self-pay | Admitting: Family Medicine

## 2020-02-18 DIAGNOSIS — E782 Mixed hyperlipidemia: Secondary | ICD-10-CM

## 2020-02-18 LAB — CMP14+EGFR
ALT: 18 IU/L (ref 0–32)
AST: 16 IU/L (ref 0–40)
Albumin/Globulin Ratio: 1.3 (ref 1.2–2.2)
Albumin: 4.1 g/dL (ref 3.8–4.8)
Alkaline Phosphatase: 60 IU/L (ref 44–121)
BUN/Creatinine Ratio: 17 (ref 12–28)
BUN: 15 mg/dL (ref 8–27)
Bilirubin Total: 0.3 mg/dL (ref 0.0–1.2)
CO2: 24 mmol/L (ref 20–29)
Calcium: 9.4 mg/dL (ref 8.7–10.3)
Chloride: 103 mmol/L (ref 96–106)
Creatinine, Ser: 0.87 mg/dL (ref 0.57–1.00)
GFR calc Af Amer: 79 mL/min/{1.73_m2} (ref >59)
GFR calc non Af Amer: 69 mL/min/{1.73_m2} (ref >59)
Globulin, Total: 3.1 g/dL (ref 1.5–4.5)
Glucose: 83 mg/dL (ref 65–99)
Potassium: 4.5 mmol/L (ref 3.5–5.2)
Sodium: 139 mmol/L (ref 134–144)
Total Protein: 7.2 g/dL (ref 6.0–8.5)

## 2020-02-18 LAB — LIPID PANEL
Chol/HDL Ratio: 2.8 ratio (ref 0.0–4.4)
Cholesterol, Total: 195 mg/dL (ref 100–199)
HDL: 70 mg/dL (ref >39)
LDL Chol Calc (NIH): 112 mg/dL — ABNORMAL HIGH (ref 0–99)
Triglycerides: 72 mg/dL (ref 0–149)
VLDL Cholesterol Cal: 13 mg/dL (ref 5–40)

## 2020-02-18 MED ORDER — ATORVASTATIN CALCIUM 40 MG PO TABS: 40.0000 mg | ORAL_TABLET | Freq: Every day | ORAL | 1 refills | Status: DC

## 2020-02-27 ENCOUNTER — Other Ambulatory Visit: Payer: Self-pay

## 2020-02-27 ENCOUNTER — Encounter: Payer: Self-pay | Admitting: Family Medicine

## 2020-02-27 ENCOUNTER — Ambulatory Visit (INDEPENDENT_AMBULATORY_CARE_PROVIDER_SITE_OTHER): Payer: Medicare Other | Admitting: Family Medicine

## 2020-02-27 VITALS — BP 148/91 | HR 80 | Temp 97.9°F | Ht 59.0 in | Wt 158.2 lb

## 2020-02-27 DIAGNOSIS — M79641 Pain in right hand: Secondary | ICD-10-CM

## 2020-02-27 MED ORDER — PREDNISONE 10 MG (21) PO TBPK: ORAL_TABLET | ORAL | 0 refills | Status: DC

## 2020-02-27 NOTE — Progress Notes (Signed)
Assessment & Plan:  1. Right hand pain - predniSONE (STERAPRED UNI-PAK 21 TAB) 10 MG (21) TBPK tablet; As directed x 6 days  Dispense: 21 tablet; Refill: 0   Follow up plan: Return as scheduled.  [name], MSN, APRN, FNP-C [facility]  Subjective:   Patient ID: Nicole Aguirre, female    DOB: 10-Oct-1950, 70 y.o.   MRN: [id number]  HPI: Nicole Aguirre is a 70 y.o. female presenting on 02/27/2020 for hand swelling (Right x 2 months)  Patient reports continued right hand pain that has been going on for a couple months now.  She has been applying Voltaren gel and taking Advil, which has been somewhat helpful until this week.  She states 2 days ago it did not matter what she did nothing took the pain away.  The pain kept her up last night.  She has not injured herself.  She did start cleaning a two-story house prior to the onset of her symptoms.  She does report numbness that goes down into her to middle fingers and thumb.  Otherwise she reports the pain is an ache in the palm of her hand.   ROS: Negative unless specifically indicated above in HPI.   Relevant past medical history reviewed and updated as indicated.   Allergies and medications reviewed and updated.   Current Outpatient Medications:  .  atorvastatin (LIPITOR) 40 MG tablet, Take 1 tablet (40 mg total) by mouth at bedtime., Disp: 90 tablet, Rfl: 1 .  cetirizine (ZYRTEC) 10 MG tablet, Take 1 tablet (10 mg total) by mouth daily., Disp: 90 tablet, Rfl: 1 .  diclofenac Sodium (VOLTAREN) 1 % GEL, Apply 2 g topically 4 (four) times daily., Disp: 150 g, Rfl: 3 .  DULoxetine (CYMBALTA) 30 MG capsule, Take 1 capsule (30 mg total) by mouth daily., Disp: 30 capsule, Rfl: 2 .  fluticasone (FLONASE) 50 MCG/ACT nasal spray, USE 2 SPRAYS IN BOTH  NOSTRILS DAILY, Disp: 48 g, Rfl: 1 .  hydrochlorothiazide (HYDRODIURIL) 25 MG tablet, Take 0.5 tablets (12.5 mg total) by mouth daily. (Patient taking  differently: Take 12.5 mg by mouth daily as needed.), Disp: 45 tablet, Rfl: 3 .  hyoscyamine (LEVSIN) 0.125 MG tablet, Take 1 tablet (0.125 mg total) by mouth every 4 (four) hours as needed for cramping., Disp: 30 tablet, Rfl: 0 .  magnesium oxide (MAG-OX) 400 MG tablet, Take 200 mg by mouth daily., Disp: , Rfl:  .  metoprolol succinate (TOPROL-XL) 25 MG 24 hr tablet, Take 0.5 tablets (12.5 mg total) by mouth daily., Disp: 90 tablet, Rfl: 1 .  montelukast (SINGULAIR) 10 MG tablet, TAKE 1 TABLET BY MOUTH  DAILY, Disp: 90 tablet, Rfl: 0 .  omeprazole (PRILOSEC) 40 MG capsule, Take 1 capsule (40 mg total) by mouth daily., Disp: 90 capsule, Rfl: 3  No Known Allergies  Objective:   BP (!) 148/91   Pulse 80   Temp 97.9 F (36.6 C) (Temporal)   Ht 4\' 11"  (1.499 m)   Wt 158 lb 3.2 oz (71.8 kg)   SpO2 97%   BMI 31.95 kg/m    Physical Exam Vitals reviewed.  Constitutional:      General: She is not in acute distress.    Appearance: Normal appearance. She is not ill-appearing, toxic-appearing or diaphoretic.  HENT:     Head: Normocephalic and atraumatic.  Eyes:     General: No scleral icterus.       Right eye: No discharge.  Left eye: No discharge.     Conjunctiva/sclera: Conjunctivae normal.  Cardiovascular:     Rate and Rhythm: Normal rate.  Pulmonary:     Effort: Pulmonary effort is normal. No respiratory distress.  Musculoskeletal:        General: Normal range of motion.     Right hand: Tenderness present. No swelling, deformity or lacerations. Normal range of motion.     Cervical back: Normal range of motion.  Skin:    General: Skin is warm and dry.     Capillary Refill: Capillary refill takes less than 2 seconds.  Neurological:     General: No focal deficit present.     Mental Status: She is alert and oriented to person, place, and time. Mental status is at baseline.  Psychiatric:        Mood and Affect: Mood normal.        Behavior: Behavior normal.        Thought  Content: Thought content normal.        Judgment: Judgment normal.

## 2020-03-08 ENCOUNTER — Telehealth: Payer: Self-pay

## 2020-03-08 NOTE — Telephone Encounter (Signed)
Please review and advise.

## 2020-03-08 NOTE — Telephone Encounter (Signed)
Continue Tylenol and Voltaren gel on a regular basis.

## 2020-03-08 NOTE — Telephone Encounter (Signed)
Nonemergent. Defer to PCP return tomorrow.

## 2020-03-08 NOTE — Telephone Encounter (Signed)
Patient notified and verbalized understanding. 

## 2020-03-08 NOTE — Telephone Encounter (Signed)
Patient of Nicole Aguirre. Please advise

## 2020-03-11 ENCOUNTER — Ambulatory Visit (INDEPENDENT_AMBULATORY_CARE_PROVIDER_SITE_OTHER): Payer: Medicare Other | Admitting: *Deleted

## 2020-03-11 DIAGNOSIS — Z0000 Encounter for general adult medical examination without abnormal findings: Secondary | ICD-10-CM

## 2020-03-11 NOTE — Patient Instructions (Signed)
  [facility] Maintenance Summary and Written Plan of Care  Ms. [place] ,  Thank you for allowing me to perform your Medicare Annual Wellness Visit and for your ongoing commitment to your health.   Health Maintenance & Immunization History Health Maintenance  Topic Date Due  . MAMMOGRAM  02/16/2021 (Originally 11/16/2001)  . COLONOSCOPY (Pts 45-31yrs Insurance coverage will need to be confirmed)  02/16/2021 (Originally 11/16/1996)  . Hepatitis C Screening  02/16/2021 (Originally 1950-07-21)  . TETANUS/TDAP  11/21/2020  . PNA vac Low Risk Adult (2 of 2 - PPSV23) 02/16/2021  . INFLUENZA VACCINE  Completed  . DEXA SCAN  Completed  . COVID-19 Vaccine  Completed   Immunization History  Administered Date(s) Administered  . Fluad Quad(high Dose 65+) 10/24/2019  . Influenza, High Dose Seasonal PF 10/09/2017  . Moderna Sars-Covid-2 Vaccination 01/21/2019, 02/18/2019, 11/20/2019  . Pneumococcal Conjugate-13 02/17/2020  . Tdap 11/22/2010    These are the patient goals that we discussed: Goals Addressed            This Visit's Progress   . AWV       03/11/2020 AWV Goal: Exercise for General Health   Patient will verbalize understanding of the benefits of increased physical activity:  Exercising regularly is important. It will improve your overall fitness, flexibility, and endurance.  Regular exercise also will improve your overall health. It can help you control your weight, reduce stress, and improve your bone density.  Over the next year, patient will increase physical activity as tolerated with a goal of at least 150 minutes of moderate physical activity per week.   You can tell that you are exercising at a moderate intensity if your heart starts beating faster and you start breathing faster but can still hold a conversation.  Moderate-intensity exercise ideas include:  Walking 1 mile (1.6 km) in about 15  minutes  Biking  Hiking  Golfing  Dancing  Water aerobics  Patient will verbalize understanding of everyday activities that increase physical activity by providing examples like the following: ? Yard work, such as: ? Pushing a Conservation officer, nature ? Raking and bagging leaves ? Washing your car ? Pushing a stroller ? Shoveling snow ? Gardening ? Washing windows or floors  Patient will be able to explain general safety guidelines for exercising:   Before you start a new exercise program, talk with your health care provider.  Do not exercise so much that you hurt yourself, feel dizzy, or get very short of breath.  Wear comfortable clothes and wear shoes with good support.  Drink plenty of water while you exercise to prevent dehydration or heat stroke.  Work out until your breathing and your heartbeat get faster.         This is a list of Health Maintenance Items that are overdue or due now: There are no preventive care reminders to display for this patient.   Orders/Referrals Placed Today: No orders of the defined types were placed in this encounter.  (Contact our referral department at [phone] if you have not spoken with someone about your referral appointment within the next 5 days)    Follow-up Plan . Follow-up with [name], FNP as planned . Keep appointment for mammogram  . Keep eye exam appointment

## 2020-03-11 NOTE — Progress Notes (Signed)
MEDICARE ANNUAL WELLNESS VISIT  03/11/2020  Telephone Visit Disclaimer This Medicare AWV was conducted by telephone due to national recommendations for restrictions regarding the COVID-19 Pandemic (e.g. social distancing).  I verified, using two identifiers, that I am speaking with Nicole Aguirre or their authorized healthcare agent. I discussed the limitations, risks, security, and privacy concerns of performing an evaluation and management service by telephone and the potential availability of an in-person appointment in the future. The patient expressed understanding and agreed to proceed.  Location of Patient: Home  Location of Provider (nurse):  [facility]  Subjective:    Nicole Aguirre is a 70 y.o. female patient of [name], FNP who had a Medicare Annual Wellness Visit today via telephone. Nicole Aguirre is Retired and lives with their spouse. she has 2 children. she reports that she is socially active and does interact with friends/family regularly. she is not physically active and enjoys spending time with her sons and grandchildren, and cooking.  Patient Care Team: [name], FNP as PCP - General (Family Medicine)  Advanced Directives 03/11/2020 07/26/2018  Does Patient Have a Medical Advance Directive? No No  Would patient like information on creating a medical advance directive? No - Patient declined No - Patient declined    Hospital Utilization Over the Past 12 Months: # of hospitalizations or ER visits: 0 # of surgeries: 0  Review of Systems    Patient reports that her overall health is better compared to last year. She states last year she was battling back problems and that they have improved greatly over the last year.   History obtained from chart review and the patient Musculoskeletal ROS: positive for - pain in hand - right  Patient Reported Readings (BP, Pulse, CBG, Weight, etc) none  Pain Assessment Pain :  0-10 Pain Score: 3  Pain Type: Acute pain Pain Location: Hand Pain Orientation: Right     Current Medications & Allergies (verified) Allergies as of 03/11/2020   No Known Allergies     Medication List       Accurate as of March 11, 2020 10:15 AM. If you have any questions, ask your nurse or doctor.        STOP taking these medications   predniSONE 10 MG (21) Tbpk tablet Commonly known as: STERAPRED UNI-PAK 21 TAB     TAKE these medications   atorvastatin 40 MG tablet Commonly known as: LIPITOR Take 1 tablet (40 mg total) by mouth at bedtime.   cetirizine 10 MG tablet Commonly known as: ZYRTEC Take 1 tablet (10 mg total) by mouth daily.   diclofenac Sodium 1 % Gel Commonly known as: VOLTAREN Apply 2 g topically 4 (four) times daily.   DULoxetine 30 MG capsule Commonly known as: Cymbalta Take 1 capsule (30 mg total) by mouth daily.   fluticasone 50 MCG/ACT nasal spray Commonly known as: FLONASE USE 2 SPRAYS IN BOTH  NOSTRILS DAILY   hydrochlorothiazide 25 MG tablet Commonly known as: HYDRODIURIL Take 0.5 tablets (12.5 mg total) by mouth daily. What changed:   when to take this  reasons to take this   hyoscyamine 0.125 MG tablet Commonly known as: LEVSIN Take 1 tablet (0.125 mg total) by mouth every 4 (four) hours as needed for cramping.   magnesium oxide 400 MG tablet Commonly known as: MAG-OX Take 200 mg by mouth daily.   metoprolol succinate 25 MG 24 hr tablet Commonly known as: TOPROL-XL Take 0.5 tablets (12.5 mg  total) by mouth daily.   montelukast 10 MG tablet Commonly known as: SINGULAIR TAKE 1 TABLET BY MOUTH  DAILY   omeprazole 40 MG capsule Commonly known as: PRILOSEC Take 1 capsule (40 mg total) by mouth daily.       History (reviewed): Past Medical History:  Diagnosis Date  . Allergy   . Fibromyalgia   . Fibromyalgia   . GERD (gastroesophageal reflux disease)   . Hyperlipidemia   . Hypertension   . PVC (premature  ventricular contraction)    Past Surgical History:  Procedure Laterality Date  . ABDOMINAL HYSTERECTOMY    . APPENDECTOMY  1985  . CATARACT EXTRACTION, BILATERAL    . CHOLECYSTECTOMY  2010   Family History  Problem Relation Age of Onset  . Lupus Mother   . Rheum arthritis Mother   . Lung cancer Mother   . Emphysema Father   . Pancreatic cancer Father   . Lupus Sister   . Heart disease Brother   . Heart disease Son   . Lupus Sister   . Lung disease Sister   . Early death Brother        Died from falling from a tree  . Mental illness Son    Social History   Socioeconomic History  . Marital status: Married    Spouse name: [name]  . Number of children: 2  . Years of education: 10th  . Highest education level: GED or equivalent  Occupational History  . Occupation: Dietary    Comment: [employer]  . Occupation: retired  Tobacco Use  . Smoking status: Former Smoker    Packs/day: 1.00    Years: 4.00    Pack years: 4.00    Quit date: 07/25/1976    Years since quitting: 43.6  . Smokeless tobacco: Never Used  Vaping Use  . Vaping Use: Never used  Substance and Sexual Activity  . Alcohol use: Never  . Drug use: Never  . Sexual activity: Yes    Birth control/protection: Surgical  Other Topics Concern  . Not on file  Social History Narrative  . Not on file   Social Determinants of Health   Financial Resource Strain: Not on file  Food Insecurity: Not on file  Transportation Needs: Not on file  Physical Activity: Not on file  Stress: Not on file  Social Connections: Not on file    Activities of Daily Living In your present state of health, do you have any difficulty performing the following activities: 03/11/2020  Hearing? N  Vision? Y  Comment Having trouble with night vision, Wears glasses  Difficulty concentrating or making decisions? N  Walking or climbing stairs? Y  Comment climbing stairs due to back  Dressing or bathing? N  Doing errands, shopping? N   Preparing Food and eating ? N  Using the Toilet? N  In the past six months, have you accidently leaked urine? N  Do you have problems with loss of bowel control? N  Managing your Medications? N  Managing your Finances? N  Housekeeping or managing your Housekeeping? N  Some recent data might be hidden    Patient Education/ Literacy How often do you need to have someone help you when you read instructions, pamphlets, or other written materials from your doctor or pharmacy?: 1 - Never What is the last grade level you completed in school?: 10th and did go back and get GED  Exercise Current Exercise Habits: The patient does not participate in regular exercise at  present  Diet Patient reports consuming 2 meals a day and 1 snack(s) a day Patient reports that her primary diet is: Regular Patient reports that she does have regular access to food.   Depression Screen PHQ 2/9 Scores 03/11/2020 02/17/2020 09/12/2019 03/31/2019 07/29/2018 07/26/2018 07/26/2018  PHQ - 2 Score 0 0 0 0 0 0 0  PHQ- 9 Score - - - - 0 - -     Fall Risk Fall Risk  03/11/2020 02/17/2020 09/12/2019 03/31/2019 07/29/2018  Falls in the past year? 0 0 0 0 0     Objective:  Nicole Aguirre seemed alert and oriented and she participated appropriately during our telephone visit.  Blood Pressure Weight BMI  BP Readings from Last 3 Encounters:  02/27/20 (!) 148/91  02/17/20 (!) 164/92  09/12/19 133/88   Wt Readings from Last 3 Encounters:  02/27/20 158 lb 3.2 oz (71.8 kg)  02/17/20 162 lb 3.2 oz (73.6 kg)  09/12/19 160 lb 6.4 oz (72.8 kg)   BMI Readings from Last 1 Encounters:  02/27/20 31.95 kg/m    *Unable to obtain current vital signs, weight, and BMI due to telephone visit type  Hearing/Vision  . Nicole Aguirre did not seem to have difficulty with hearing/understanding during the telephone conversation . Reports that she has not had a formal eye exam by an eye care professional within the past year . Reports that she  has not had a formal hearing evaluation within the past year *Unable to fully assess hearing and vision during telephone visit type     Cognitive Function 6CIT Screen 03/11/2020 07/26/2018  What Year? 0 points 0 points  What month? 0 points 0 points  What time? 0 points 0 points  Count back from 20 0 points 0 points  Months in reverse 0 points 2 points  Repeat phrase 0 points 0 points  Total Score 0 2      Normal Cognitive Function Screening: Yes   Immunization & Health Maintenance Record Immunization History  Administered Date(s) Administered  . Fluad Quad(high Dose 65+) 10/24/2019  . Influenza, High Dose Seasonal PF 10/09/2017  . Moderna Sars-Covid-2 Vaccination 01/21/2019, 02/18/2019, 11/20/2019  . Pneumococcal Conjugate-13 02/17/2020  . Tdap 11/22/2010    Health Maintenance  Topic Date Due  . MAMMOGRAM  02/16/2021 (Originally 11/16/2001)  . COLONOSCOPY (Pts 45-25yrs Insurance coverage will need to be confirmed)  02/16/2021 (Originally 11/16/1996)  . Hepatitis C Screening  02/16/2021 (Originally [birth year])  . TETANUS/TDAP  11/21/2020  . PNA vac Low Risk Adult (2 of 2 - PPSV23) 02/16/2021  . INFLUENZA VACCINE  Completed  . DEXA SCAN  Completed  . COVID-19 Vaccine  Completed       Assessment  This is a routine wellness examination for Nicole Aguirre.  Health Maintenance: Due or Overdue There are no preventive care reminders to display for this patient.  Nicole Aguirre Palencia does not need a referral for [employer] Assistance: Care Management:   no Social Work:    no Prescription Assistance:  no Nutrition/Diabetes Education:  no   Plan:  Personalized Goals Goals Addressed            This Visit's Progress   . AWV       03/11/2020 AWV Goal: Exercise for General Health   Patient will verbalize understanding of the benefits of increased physical activity:  Exercising regularly is important. It will improve your overall fitness, flexibility, and  endurance.  Regular exercise also will improve your overall health. It can help you control  your weight, reduce stress, and improve your bone density.  Over the next year, patient will increase physical activity as tolerated with a goal of at least 150 minutes of moderate physical activity per week.   You can tell that you are exercising at a moderate intensity if your heart starts beating faster and you start breathing faster but can still hold a conversation.  Moderate-intensity exercise ideas include:  Walking 1 mile (1.6 km) in about 15 minutes  Biking  Hiking  Golfing  Dancing  Water aerobics  Patient will verbalize understanding of everyday activities that increase physical activity by providing examples like the following: ? Yard work, such as: ? Pushing a Conservation officer, nature ? Raking and bagging leaves ? Washing your car ? Pushing a stroller ? Shoveling snow ? Gardening ? Washing windows or floors  Patient will be able to explain general safety guidelines for exercising:   Before you start a new exercise program, talk with your health care provider.  Do not exercise so much that you hurt yourself, feel dizzy, or get very short of breath.  Wear comfortable clothes and wear shoes with good support.  Drink plenty of water while you exercise to prevent dehydration or heat stroke.  Work out until your breathing and your heartbeat get faster.       Personalized Health Maintenance & Screening Recommendations  Screening mammography Colorectal cancer screening Eye Exam  Lung Cancer Screening Recommended: no (Low Dose CT Chest recommended if Age 70-80 years, 30 pack-year currently smoking OR have quit w/in past 15 years) Hepatitis C Screening recommended: no HIV Screening recommended: no  Advanced Directives: Written information was not prepared per patient's request.  Referrals & Orders No orders of the defined types were placed in this encounter.   Follow-up  Plan . Follow-up with [name], FNP as planned . Keep appointment for mammogram  . Keep eye exam appointment . AVS printed and mailed to patient   I have personally reviewed and noted the following in the patient's chart:   . Medical and social history . Use of alcohol, tobacco or illicit drugs  . Current medications and supplements . Functional ability and status . Nutritional status . Physical activity . Advanced directives . List of other physicians . Hospitalizations, surgeries, and ER visits in previous 12 months . Vitals . Screenings to include cognitive, depression, and falls . Referrals and appointments  In addition, I have reviewed and discussed with Nicole Aguirre certain preventive protocols, quality metrics, and best practice recommendations. A written personalized care plan for preventive services as well as general preventive health recommendations is available and can be mailed to the patient at her request.      Lynnea Ferrier, LPN  [id number]

## 2020-03-19 DIAGNOSIS — M5416 Radiculopathy, lumbar region: Secondary | ICD-10-CM | POA: Diagnosis not present

## 2020-03-20 ENCOUNTER — Other Ambulatory Visit: Payer: Self-pay | Admitting: Family Medicine

## 2020-03-20 DIAGNOSIS — M797 Fibromyalgia: Secondary | ICD-10-CM

## 2020-03-30 ENCOUNTER — Other Ambulatory Visit: Payer: Self-pay

## 2020-03-30 ENCOUNTER — Ambulatory Visit (INDEPENDENT_AMBULATORY_CARE_PROVIDER_SITE_OTHER): Payer: Medicare Other | Admitting: Family Medicine

## 2020-03-30 ENCOUNTER — Encounter: Payer: Self-pay | Admitting: Family Medicine

## 2020-03-30 VITALS — BP 133/91 | HR 69 | Temp 97.4°F | Ht 59.0 in | Wt 161.8 lb

## 2020-03-30 DIAGNOSIS — M5136 Other intervertebral disc degeneration, lumbar region: Secondary | ICD-10-CM

## 2020-03-30 DIAGNOSIS — M797 Fibromyalgia: Secondary | ICD-10-CM

## 2020-03-30 DIAGNOSIS — I10 Essential (primary) hypertension: Secondary | ICD-10-CM

## 2020-03-30 DIAGNOSIS — Z1211 Encounter for screening for malignant neoplasm of colon: Secondary | ICD-10-CM | POA: Diagnosis not present

## 2020-03-30 DIAGNOSIS — M25551 Pain in right hip: Secondary | ICD-10-CM | POA: Insufficient documentation

## 2020-03-30 DIAGNOSIS — M51369 Other intervertebral disc degeneration, lumbar region without mention of lumbar back pain or lower extremity pain: Secondary | ICD-10-CM | POA: Insufficient documentation

## 2020-03-30 MED ORDER — HYDROCHLOROTHIAZIDE 25 MG PO TABS: 25.0000 mg | ORAL_TABLET | Freq: Every day | ORAL | 1 refills | Status: DC

## 2020-03-30 NOTE — Progress Notes (Signed)
Assessment & Plan:  1. Fibromyalgia Well controlled on current regimen.  Patient would like to stay on medication a little while longer to see if the nerve is feeling on the inside resolves.  2. Essential hypertension Improving.  Hydrochlorothiazide increased from 12.5 mg to 25 mg once daily.  Continue Toprol-XL 12.5 mg once daily. - hydrochlorothiazide (HYDRODIURIL) 25 MG tablet; Take 1 tablet (25 mg total) by mouth daily.  Dispense: 90 tablet; Refill: 1  3. DDD (degenerative disc disease), lumbar - Ambulatory referral to Pain Clinic  4. Right hip pain - Ambulatory referral to Pain Clinic  5. Colon cancer screening - Ambulatory referral to Gastroenterology   Return in about 3 months (around 06/30/2020) for follow-up of chronic medication conditions.  [name], MSN, APRN, FNP-C [facility]  Subjective:    Patient ID: Nicole Aguirre, female    DOB: 1950-08-13, 70 y.o.   MRN: [id number]  Patient Care Team: [name], FNP as PCP - General (Family Medicine)   Chief Complaint:  Chief Complaint  Patient presents with  . Fibromyalgia    Patient states that it has improved.   . Hypertension    6 week follow up   . Anxiety    Patient states it started after she started taking Duloxetine.     HPI: Nicole Aguirre is a 71 y.o. female presenting on 03/30/2020 for Fibromyalgia (Patient states that it has improved. ), Hypertension (6 week follow up ), and Anxiety (Patient states it started after she started taking Duloxetine. )  Fibromyalgia: Patient reports that Cymbalta has helped with her fibromyalgia pain although it makes her feel nervous on the inside.  She would like to stay on the medication a little longer and see if this resolves.  Hypertension: Patient does check her blood pressure at home and reports approximately 3 days out of the week her blood pressure is still high.  She gives an example of 160 over 80s.  New  complaints: Patient is requesting a referral to Dr. Mechele Dawley at [facility] due to degenerative disc disease and right hip pain.  She is going to the orthopedic, Dr. Lowella Dell, who is doing injections in her back.  Social history:  Relevant past medical, surgical, family and social history reviewed and updated as indicated. Interim medical history since our last visit reviewed.  Allergies and medications reviewed and updated.  DATA REVIEWED: CHART IN EPIC  ROS: Negative unless specifically indicated above in HPI.    Current Outpatient Medications:  .  atorvastatin (LIPITOR) 40 MG tablet, Take 1 tablet (40 mg total) by mouth at bedtime., Disp: 90 tablet, Rfl: 1 .  cetirizine (ZYRTEC) 10 MG tablet, Take 1 tablet (10 mg total) by mouth daily., Disp: 90 tablet, Rfl: 1 .  diclofenac Sodium (VOLTAREN) 1 % GEL, Apply 2 g topically 4 (four) times daily., Disp: 150 g, Rfl: 3 .  DULoxetine (CYMBALTA) 30 MG capsule, TAKE 1 CAPSULE BY MOUTH EVERY DAY, Disp: 90 capsule, Rfl: 0 .  fluticasone (FLONASE) 50 MCG/ACT nasal spray, USE 2 SPRAYS IN BOTH  NOSTRILS DAILY, Disp: 48 g, Rfl: 1 .  hydrochlorothiazide (HYDRODIURIL) 25 MG tablet, Take 0.5 tablets (12.5 mg total) by mouth daily. (Patient taking differently: Take 12.5 mg by mouth daily as needed.), Disp: 45 tablet, Rfl: 3 .  hyoscyamine (LEVSIN) 0.125 MG tablet, Take 1 tablet (0.125 mg total) by mouth every 4 (four) hours as needed for cramping., Disp: 30 tablet, Rfl: 0 .  magnesium oxide (  MAG-OX) 400 MG tablet, Take 200 mg by mouth daily., Disp: , Rfl:  .  metoprolol succinate (TOPROL-XL) 25 MG 24 hr tablet, Take 0.5 tablets (12.5 mg total) by mouth daily., Disp: 90 tablet, Rfl: 1 .  montelukast (SINGULAIR) 10 MG tablet, TAKE 1 TABLET BY MOUTH  DAILY, Disp: 90 tablet, Rfl: 0 .  omeprazole (PRILOSEC) 40 MG capsule, Take 1 capsule (40 mg total) by mouth daily., Disp: 90 capsule, Rfl: 3   No Known Allergies Past Medical History:  Diagnosis Date   . Allergy   . Fibromyalgia   . GERD (gastroesophageal reflux disease)   . Hyperlipidemia   . Hypertension   . PVC (premature ventricular contraction)     Past Surgical History:  Procedure Laterality Date  . ABDOMINAL HYSTERECTOMY    . APPENDECTOMY  1985  . CATARACT EXTRACTION, BILATERAL    . CHOLECYSTECTOMY  2010    Social History   Socioeconomic History  . Marital status: Married    Spouse name: [name]  . Number of children: 2  . Years of education: 10th  . Highest education level: GED or equivalent  Occupational History  . Occupation: Dietary    Comment: [employer]  . Occupation: retired  Tobacco Use  . Smoking status: Former Smoker    Packs/day: 1.00    Years: 4.00    Pack years: 4.00    Quit date: 07/25/1976    Years since quitting: 43.7  . Smokeless tobacco: Never Used  Vaping Use  . Vaping Use: Never used  Substance and Sexual Activity  . Alcohol use: Never  . Drug use: Never  . Sexual activity: Yes    Birth control/protection: Surgical  Other Topics Concern  . Not on file  Social History Narrative  . Not on file   Social Determinants of Health   Financial Resource Strain: Not on file  Food Insecurity: Not on file  Transportation Needs: Not on file  Physical Activity: Not on file  Stress: Not on file  Social Connections: Not on file  Intimate Partner Violence: Not on file        Objective:    BP (!) 133/91   Pulse 69   Temp (!) 97.4 F (36.3 C) (Temporal)   Ht 4\' 11"  (1.499 m)   Wt 161 lb 12.8 oz (73.4 kg)   SpO2 97%   BMI 32.68 kg/m   Wt Readings from Last 3 Encounters:  03/30/20 161 lb 12.8 oz (73.4 kg)  02/27/20 158 lb 3.2 oz (71.8 kg)  02/17/20 162 lb 3.2 oz (73.6 kg)    Physical Exam Vitals reviewed.  Constitutional:      General: She is not in acute distress.    Appearance: Normal appearance. She is obese. She is not ill-appearing, toxic-appearing or diaphoretic.  HENT:     Head: Normocephalic and atraumatic.  Eyes:      General: No scleral icterus.       Right eye: No discharge.        Left eye: No discharge.     Conjunctiva/sclera: Conjunctivae normal.  Cardiovascular:     Rate and Rhythm: Normal rate and regular rhythm.     Heart sounds: Normal heart sounds. No murmur heard. No friction rub. No gallop.   Pulmonary:     Effort: Pulmonary effort is normal. No respiratory distress.     Breath sounds: Normal breath sounds. No stridor. No wheezing, rhonchi or rales.  Musculoskeletal:        General: Normal  range of motion.     Cervical back: Normal range of motion.  Skin:    General: Skin is warm and dry.     Capillary Refill: Capillary refill takes less than 2 seconds.  Neurological:     General: No focal deficit present.     Mental Status: She is alert and oriented to person, place, and time. Mental status is at baseline.  Psychiatric:        Mood and Affect: Mood normal.        Behavior: Behavior normal.        Thought Content: Thought content normal.        Judgment: Judgment normal.     Lab Results  Component Value Date   TSH 1.860 07/26/2018   Lab Results  Component Value Date   WBC 3.7 09/12/2019   HGB 14.1 09/12/2019   HCT 41.2 09/12/2019   MCV 90 09/12/2019   PLT 237 09/12/2019   Lab Results  Component Value Date   NA 139 02/17/2020   K 4.5 02/17/2020   CO2 24 02/17/2020   GLUCOSE 83 02/17/2020   BUN 15 02/17/2020   CREATININE 0.87 02/17/2020   BILITOT 0.3 02/17/2020   ALKPHOS 60 02/17/2020   AST 16 02/17/2020   ALT 18 02/17/2020   PROT 7.2 02/17/2020   ALBUMIN 4.1 02/17/2020   CALCIUM 9.4 02/17/2020   Lab Results  Component Value Date   CHOL 195 02/17/2020   Lab Results  Component Value Date   HDL 70 02/17/2020   Lab Results  Component Value Date   LDLCALC 112 (H) 02/17/2020   Lab Results  Component Value Date   TRIG 72 02/17/2020   Lab Results  Component Value Date   CHOLHDL 2.8 02/17/2020   No results found for: HGBA1C

## 2020-04-08 ENCOUNTER — Other Ambulatory Visit: Payer: Self-pay | Admitting: Family Medicine

## 2020-04-08 DIAGNOSIS — Z1231 Encounter for screening mammogram for malignant neoplasm of breast: Secondary | ICD-10-CM

## 2020-04-08 DIAGNOSIS — M25551 Pain in right hip: Secondary | ICD-10-CM | POA: Diagnosis not present

## 2020-04-12 ENCOUNTER — Ambulatory Visit (INDEPENDENT_AMBULATORY_CARE_PROVIDER_SITE_OTHER): Payer: Medicare Other | Admitting: Family Medicine

## 2020-04-12 DIAGNOSIS — J0100 Acute maxillary sinusitis, unspecified: Secondary | ICD-10-CM | POA: Diagnosis not present

## 2020-04-12 MED ORDER — PSEUDOEPHEDRINE-GUAIFENESIN ER 120-1200 MG PO TB12: 1.0000 | ORAL_TABLET | Freq: Two times a day (BID) | ORAL | 0 refills | Status: DC

## 2020-04-12 MED ORDER — AMOXICILLIN-POT CLAVULANATE 875-125 MG PO TABS: 1.0000 | ORAL_TABLET | Freq: Two times a day (BID) | ORAL | 0 refills | Status: DC

## 2020-04-12 NOTE — Progress Notes (Signed)
Subjective:    Patient ID: Nicole Aguirre, female    DOB: Feb 26, 1950, 70 y.o.   MRN: [id number]   HPI: Nicole Aguirre is a 70 y.o. female presenting for sinus drainage & congestion. Blowing out thick purulent mucous. HA that hurts in forehead and behind eyes. Chills at times. Onset 3 wks to 1 month ago. Has a dust allergy. Denies fever. A little cough. No dyspnea.    Depression screen [facility] 2/9 03/30/2020 03/30/2020 03/11/2020 02/17/2020 09/12/2019  Decreased Interest 0 0 0 0 0  Down, Depressed, Hopeless 0 0 0 0 0  PHQ - 2 Score 0 0 0 0 0  Altered sleeping 0 - - - -  Tired, decreased energy 3 - - - -  Change in appetite 2 - - - -  Feeling bad or failure about yourself  0 - - - -  Trouble concentrating 0 - - - -  Moving slowly or fidgety/restless 0 - - - -  Suicidal thoughts 0 - - - -  PHQ-9 Score 5 - - - -  Difficult doing work/chores Not difficult at all - - - -     Relevant past medical, surgical, family and social history reviewed and updated as indicated.  Interim medical history since our last visit reviewed. Allergies and medications reviewed and updated.  ROS:  Review of Systems  Constitutional: Negative for activity change, appetite change, chills and fever.  HENT: Positive for congestion, postnasal drip, rhinorrhea and sinus pressure. Negative for ear discharge, ear pain, hearing loss, nosebleeds, sneezing and trouble swallowing.   Respiratory: Negative for chest tightness and shortness of breath.   Cardiovascular: Negative for chest pain and palpitations.  Skin: Negative for rash.     Social History   Tobacco Use  Smoking Status Former Smoker  . Packs/day: 1.00  . Years: 4.00  . Pack years: 4.00  . Quit date: 07/25/1976  . Years since quitting: 43.7  Smokeless Tobacco Never Used       Objective:     Wt Readings from Last 3 Encounters:  03/30/20 161 lb 12.8 oz (73.4 kg)  02/27/20 158 lb 3.2 oz (71.8 kg)  02/17/20 162 lb 3.2 oz (73.6 kg)      Exam deferred. Pt. Harboring due to COVID 19. Phone visit performed.   Assessment & Plan:  No diagnosis found.  Meds ordered this encounter  Medications  . amoxicillin-clavulanate (AUGMENTIN) 875-125 MG tablet    Sig: Take 1 tablet by mouth 2 (two) times daily. Take all of this medication    Dispense:  20 tablet    Refill:  0  . Pseudoephedrine-Guaifenesin [phone] MG TB12    Sig: Take 1 tablet by mouth 2 (two) times daily. For congestion    Dispense:  12 tablet    Refill:  0    No orders of the defined types were placed in this encounter.     There are no diagnoses linked to this encounter.  Virtual Visit via telephone Note  I discussed the limitations, risks, security and privacy concerns of performing an evaluation and management service by telephone and the availability of in person appointments. The patient was identified with two identifiers. Pt.expressed understanding and agreed to proceed. Pt. Is at home. Dr. Livia Snellen is in his office.  Follow Up Instructions:   I discussed the assessment and treatment plan with the patient. The patient was provided an opportunity to ask questions and all were answered. The patient agreed with the plan and  demonstrated an understanding of the instructions.   The patient was advised to call back or seek an in-person evaluation if the symptoms worsen or if the condition fails to improve as anticipated.   Total minutes including chart review and phone contact time: 11   Follow up plan: Return if symptoms worsen or fail to improve.  Claretta Fraise, MD [place]

## 2020-04-14 ENCOUNTER — Other Ambulatory Visit: Payer: Self-pay

## 2020-04-14 ENCOUNTER — Ambulatory Visit
Admission: RE | Admit: 2020-04-14 | Discharge: 2020-04-14 | Disposition: A | Discharge status: Home / Self Care | Payer: Medicare Other | Source: Ambulatory Visit | Attending: Family Medicine | Admitting: Family Medicine

## 2020-04-14 DIAGNOSIS — Z1231 Encounter for screening mammogram for malignant neoplasm of breast: Secondary | ICD-10-CM

## 2020-04-23 DIAGNOSIS — M25551 Pain in right hip: Secondary | ICD-10-CM | POA: Diagnosis not present

## 2020-04-27 ENCOUNTER — Telehealth: Payer: Self-pay

## 2020-04-27 ENCOUNTER — Other Ambulatory Visit: Payer: Self-pay | Admitting: Family Medicine

## 2020-04-27 MED ORDER — AMOXICILLIN-POT CLAVULANATE 875-125 MG PO TABS: 1.0000 | ORAL_TABLET | Freq: Two times a day (BID) | ORAL | 0 refills | Status: DC

## 2020-04-27 NOTE — Telephone Encounter (Signed)
Pt says she has been taking Augmentin with OTC Mucinex D per Dr Livia Snellen orders and ran out of the Augmentin last Thursday but is still taking the Mucinex D and says the medicine has helped while taking but pt is not 100% better. Feels like she needs a refill on the Augmentin.   Please advise and call patient

## 2020-04-27 NOTE — Telephone Encounter (Signed)
Patient aware.

## 2020-04-27 NOTE — Telephone Encounter (Signed)
Please let the patient know that I sent their prescription to their pharmacy. Thanks, WS 

## 2020-04-29 ENCOUNTER — Other Ambulatory Visit: Payer: Self-pay | Admitting: Family Medicine

## 2020-04-29 DIAGNOSIS — D123 Benign neoplasm of transverse colon: Secondary | ICD-10-CM | POA: Diagnosis not present

## 2020-04-29 DIAGNOSIS — Z86010 Personal history of colonic polyps: Secondary | ICD-10-CM | POA: Diagnosis not present

## 2020-04-29 DIAGNOSIS — Z1211 Encounter for screening for malignant neoplasm of colon: Secondary | ICD-10-CM | POA: Diagnosis not present

## 2020-04-29 DIAGNOSIS — T7840XS Allergy, unspecified, sequela: Secondary | ICD-10-CM

## 2020-04-29 DIAGNOSIS — K5730 Diverticulosis of large intestine without perforation or abscess without bleeding: Secondary | ICD-10-CM | POA: Diagnosis not present

## 2020-04-29 DIAGNOSIS — K635 Polyp of colon: Secondary | ICD-10-CM | POA: Diagnosis not present

## 2020-05-05 ENCOUNTER — Other Ambulatory Visit: Payer: Self-pay | Admitting: Family Medicine

## 2020-05-05 DIAGNOSIS — T7840XS Allergy, unspecified, sequela: Secondary | ICD-10-CM

## 2020-05-11 ENCOUNTER — Other Ambulatory Visit: Payer: Self-pay | Admitting: Family Medicine

## 2020-05-11 DIAGNOSIS — T7840XS Allergy, unspecified, sequela: Secondary | ICD-10-CM

## 2020-06-01 ENCOUNTER — Telehealth: Payer: Self-pay

## 2020-06-01 NOTE — Telephone Encounter (Signed)
Pt called: She started feeling some mild cramping when cymbalta was started in Feb. Some nervousness was felt.  Once Hctz was changed - she feels like the cramping got worse and nervousness got some worse.  She says that the nervousness gets better once she takes the Cymbalta.  Cramping is now pretty severe. She states that she drink plenty of water.

## 2020-06-01 NOTE — Telephone Encounter (Signed)
Pt called to let Britney know that ever since she started taking a whole pill of her HCTZ and started taking Cymbalta for her fibromyalgia, she has been experiencing severe body cramps.  Needs advise. Please advise and call patient.

## 2020-06-01 NOTE — Telephone Encounter (Signed)
Cymbalta was started at the beginning of February, hydrochlorothiazide was increased in the middle of March.  How long have your symptoms been going on?

## 2020-06-01 NOTE — Telephone Encounter (Signed)
Tell her to stop the HCTZ for a few days to see if symptoms improve. Schedule a follow-up with me on Friday (may be telephone).

## 2020-06-01 NOTE — Telephone Encounter (Signed)
Patient aware.  Phone visit scheduled for Friday morning.

## 2020-06-04 ENCOUNTER — Ambulatory Visit (INDEPENDENT_AMBULATORY_CARE_PROVIDER_SITE_OTHER): Payer: Medicare Other | Admitting: Family Medicine

## 2020-06-04 ENCOUNTER — Encounter: Payer: Self-pay | Admitting: Family Medicine

## 2020-06-04 DIAGNOSIS — I10 Essential (primary) hypertension: Secondary | ICD-10-CM | POA: Diagnosis not present

## 2020-06-04 DIAGNOSIS — R252 Cramp and spasm: Secondary | ICD-10-CM

## 2020-06-04 NOTE — Progress Notes (Signed)
Virtual Visit via Telephone Note  I connected with Nicole Aguirre on 06/04/20 at 9:15 AM by telephone and verified that I am speaking with the correct person using two identifiers. Nicole Aguirre is currently located at work and an employer is currently with her during this visit. The provider, [name], FNP is located in their office at time of visit.  I discussed the limitations, risks, security and privacy concerns of performing an evaluation and management service by telephone and the availability of in person appointments. I also discussed with the patient that there may be a patient responsible charge related to this service. The patient expressed understanding and agreed to proceed.  Subjective: PCP: [name], FNP  Chief Complaint  Patient presents with  . Hypertension   Patient called earlier this week to reports she has been having cramping for a few months since starting new medications.  She was advised to hold HCTZ.  She has done this and her cramps have resolved.  She checked her blood pressure and it was 138/81.   ROS: Per HPI  Current Outpatient Medications:  .  amoxicillin-clavulanate (AUGMENTIN) 875-125 MG tablet, Take 1 tablet by mouth 2 (two) times daily. Take all of this medication, Disp: 20 tablet, Rfl: 0 .  atorvastatin (LIPITOR) 40 MG tablet, Take 1 tablet (40 mg total) by mouth at bedtime., Disp: 90 tablet, Rfl: 1 .  cetirizine (ZYRTEC) 10 MG tablet, TAKE 1 TABLET BY MOUTH EVERY DAY, Disp: 90 tablet, Rfl: 2 .  diclofenac Sodium (VOLTAREN) 1 % GEL, Apply 2 g topically 4 (four) times daily., Disp: 150 g, Rfl: 3 .  DULoxetine (CYMBALTA) 30 MG capsule, TAKE 1 CAPSULE BY MOUTH EVERY DAY, Disp: 90 capsule, Rfl: 0 .  fluticasone (FLONASE) 50 MCG/ACT nasal spray, USE 2 SPRAYS IN BOTH  NOSTRILS DAILY, Disp: 48 g, Rfl: 3 .  hydrochlorothiazide (HYDRODIURIL) 25 MG tablet, Take 1 tablet (25 mg total) by mouth daily., Disp: 90 tablet, Rfl: 1 .   hyoscyamine (LEVSIN) 0.125 MG tablet, Take 1 tablet (0.125 mg total) by mouth every 4 (four) hours as needed for cramping., Disp: 30 tablet, Rfl: 0 .  magnesium oxide (MAG-OX) 400 MG tablet, Take 200 mg by mouth daily., Disp: , Rfl:  .  metoprolol succinate (TOPROL-XL) 25 MG 24 hr tablet, Take 0.5 tablets (12.5 mg total) by mouth daily., Disp: 90 tablet, Rfl: 1 .  montelukast (SINGULAIR) 10 MG tablet, TAKE 1 TABLET BY MOUTH  DAILY, Disp: 90 tablet, Rfl: 2 .  omeprazole (PRILOSEC) 40 MG capsule, Take 1 capsule (40 mg total) by mouth daily., Disp: 90 capsule, Rfl: 3 .  Pseudoephedrine-Guaifenesin [phone] MG TB12, Take 1 tablet by mouth 2 (two) times daily. For congestion, Disp: 12 tablet, Rfl: 0  No Known Allergies Past Medical History:  Diagnosis Date  . Allergy   . Fibromyalgia   . GERD (gastroesophageal reflux disease)   . Hyperlipidemia   . Hypertension   . PVC (premature ventricular contraction)     Observations/Objective: A&O  No respiratory distress or wheezing audible over the phone Mood, judgement, and thought processes all WNL   Assessment and Plan: 1. Muscle cramping Resolved with discontinuation of HCTZ.  2. Essential hypertension Advised patient to monitor her blood pressure and keep a log to bring with her to her next appointment.  Advise she may take it to the cardiologist as well.  Discussed if her readings are consistently greater than 150/90 prior to a scheduled visit with myself  or cardiology that she needs to let us know.   Follow Up Instructions: Return As scheduled.  I discussed the assessment and treatment plan with the patient. The patient was provided an opportunity to ask questions and all were answered. The patient agreed with the plan and demonstrated an understanding of the instructions.   The patient was advised to call back or seek an in-person evaluation if the symptoms worsen or if the condition fails to improve as anticipated.  The above  assessment and management plan was discussed with the patient. The patient verbalized understanding of and has agreed to the management plan. Patient is aware to call the clinic if symptoms persist or worsen. Patient is aware when to return to the clinic for a follow-up visit. Patient educated on when it is appropriate to go to the emergency department.   Time call ended: 9:26 Am  I provided 11 minutes of non-face-to-face time during this encounter.  [name], MSN, APRN, FNP-C [facility] 06/04/20

## 2020-06-30 ENCOUNTER — Encounter: Payer: Self-pay | Admitting: Family Medicine

## 2020-06-30 ENCOUNTER — Ambulatory Visit (INDEPENDENT_AMBULATORY_CARE_PROVIDER_SITE_OTHER): Payer: Medicare Other | Admitting: Family Medicine

## 2020-06-30 ENCOUNTER — Other Ambulatory Visit: Payer: Self-pay

## 2020-06-30 VITALS — BP 141/87 | HR 57 | Temp 97.9°F | Ht 59.0 in | Wt 160.2 lb

## 2020-06-30 DIAGNOSIS — I10 Essential (primary) hypertension: Secondary | ICD-10-CM

## 2020-06-30 DIAGNOSIS — M797 Fibromyalgia: Secondary | ICD-10-CM

## 2020-06-30 DIAGNOSIS — M25551 Pain in right hip: Secondary | ICD-10-CM

## 2020-06-30 DIAGNOSIS — L299 Pruritus, unspecified: Secondary | ICD-10-CM

## 2020-06-30 DIAGNOSIS — I493 Ventricular premature depolarization: Secondary | ICD-10-CM | POA: Diagnosis not present

## 2020-06-30 DIAGNOSIS — M5136 Other intervertebral disc degeneration, lumbar region: Secondary | ICD-10-CM | POA: Diagnosis not present

## 2020-06-30 DIAGNOSIS — K219 Gastro-esophageal reflux disease without esophagitis: Secondary | ICD-10-CM | POA: Diagnosis not present

## 2020-06-30 DIAGNOSIS — E782 Mixed hyperlipidemia: Secondary | ICD-10-CM

## 2020-06-30 LAB — LIPID PANEL
Chol/HDL Ratio: 2.6 ratio (ref 0.0–4.4)
Cholesterol, Total: 206 mg/dL — ABNORMAL HIGH (ref 100–199)
HDL: 79 mg/dL (ref >39)
LDL Chol Calc (NIH): 110 mg/dL — ABNORMAL HIGH (ref 0–99)
Triglycerides: 95 mg/dL (ref 0–149)
VLDL Cholesterol Cal: 17 mg/dL (ref 5–40)

## 2020-06-30 LAB — CBC WITH DIFFERENTIAL/PLATELET
Basophils Absolute: 0 10*3/uL (ref 0.0–0.2)
Basos: 1 %
EOS (ABSOLUTE): 0.1 10*3/uL (ref 0.0–0.4)
Eos: 3 %
Hematocrit: 41.8 % (ref 34.0–46.6)
Hemoglobin: 14.3 g/dL (ref 11.1–15.9)
Immature Grans (Abs): 0 10*3/uL (ref 0.0–0.1)
Immature Granulocytes: 0 %
Lymphocytes Absolute: 1.7 10*3/uL (ref 0.7–3.1)
Lymphs: 36 %
MCH: 31.4 pg (ref 26.6–33.0)
MCHC: 34.2 g/dL (ref 31.5–35.7)
MCV: 92 fL (ref 79–97)
Monocytes Absolute: 0.4 10*3/uL (ref 0.1–0.9)
Monocytes: 9 %
Neutrophils Absolute: 2.4 10*3/uL (ref 1.4–7.0)
Neutrophils: 51 %
Platelets: 246 10*3/uL (ref 150–450)
RBC: 4.56 x10E6/uL (ref 3.77–5.28)
RDW: 12.9 % (ref 11.7–15.4)
WBC: 4.7 10*3/uL (ref 3.4–10.8)

## 2020-06-30 LAB — CMP14+EGFR
ALT: 15 IU/L (ref 0–32)
AST: 15 IU/L (ref 0–40)
Albumin/Globulin Ratio: 1.4 (ref 1.2–2.2)
Albumin: 4.2 g/dL (ref 3.8–4.8)
Alkaline Phosphatase: 71 IU/L (ref 44–121)
BUN/Creatinine Ratio: 11 — ABNORMAL LOW (ref 12–28)
BUN: 9 mg/dL (ref 8–27)
Bilirubin Total: 0.4 mg/dL (ref 0.0–1.2)
CO2: 21 mmol/L (ref 20–29)
Calcium: 9.6 mg/dL (ref 8.7–10.3)
Chloride: 104 mmol/L (ref 96–106)
Creatinine, Ser: 0.83 mg/dL (ref 0.57–1.00)
Globulin, Total: 2.9 g/dL (ref 1.5–4.5)
Glucose: 83 mg/dL (ref 65–99)
Potassium: 4.2 mmol/L (ref 3.5–5.2)
Sodium: 140 mmol/L (ref 134–144)
Total Protein: 7.1 g/dL (ref 6.0–8.5)
eGFR: 77 mL/min/{1.73_m2} (ref >59)

## 2020-06-30 MED ORDER — AMLODIPINE BESYLATE 5 MG PO TABS: 5.0000 mg | ORAL_TABLET | Freq: Every day | ORAL | 2 refills | Status: DC

## 2020-06-30 MED ORDER — LEVOCETIRIZINE DIHYDROCHLORIDE 5 MG PO TABS
5.0000 mg | ORAL_TABLET | Freq: Every evening | ORAL | 1 refills | Status: DC
Start: 2020-06-30 — End: 2020-09-14

## 2020-06-30 NOTE — Progress Notes (Signed)
Assessment & Plan:  1. Essential hypertension Uncontrolled. Started amlodipine 5 mg once daily. Continue monitoring BP at home, keep a log, and bring it with her to her next appointment.  - CBC with Differential/Platelet - CMP14+EGFR - Lipid panel - amLODipine (NORVASC) 5 MG tablet; Take 1 tablet (5 mg total) by mouth daily.  Dispense: 30 tablet; Refill: 2  2. PVC (premature ventricular contraction) Well controlled on current regimen.   3. Mixed hyperlipidemia Labs to assess after increasing Atorvastatin. Education provided on high cholesterol.  - CMP14+EGFR - Lipid panel  4. Gastroesophageal reflux disease without esophagitis Well controlled on current regimen.  - CMP14+EGFR  5. DDD (degenerative disc disease), lumbar - Ambulatory referral to Pain Clinic  6. Fibromyalgia - Ambulatory referral to Pain Clinic  7. Right hip pain - Ambulatory referral to Pain Clinic  8. Pruritus Changed Zyrtec to Xyzal. Advised to stop using new body wash. - levocetirizine (XYZAL) 5 MG tablet; Take 1 tablet (5 mg total) by mouth every evening.  Dispense: 90 tablet; Refill: 1   Return in about 6 weeks (around 08/11/2020) for HTN.  [name], MSN, APRN, FNP-C [facility]  Subjective:    Patient ID: Nicole Aguirre, female    DOB: [birth year], 70 y.o.   MRN: [id number]  Patient Care Team: [name], FNP as PCP - General (Family Medicine)   Chief Complaint:  Chief Complaint  Patient presents with   Medical Management of Chronic Issues    3 mos & wants referral to [facility] in [place]    HPI: Nicole Aguirre is a 70 y.o. female presenting on 06/30/2020 for Medical Management of Chronic Issues (3 mos & wants referral to [facility] in [place])  Fibromyalgia: Patient has quit taking Cymbalta as she did not feel it was helping. She has been off of it x1 month and states it was hard to get off of it. Previously failed therapy with gabapentin,  Lyrica, and Savella. States Effexor caused a nervous breakdown. She was previously referred to [facility], but because she was not able to get out their right away, they closed the referral. She would like a new one to be placed. She does not wish to try any other medications until she gets in to see them.   Hypertension: Patient does check her blood pressure at home and reports her blood pressure is still high.   New complaints: Patient reports all over itching. She has been using a new body wash.   Social history:  Relevant past medical, surgical, family and social history reviewed and updated as indicated. Interim medical history since our last visit reviewed.  Allergies and medications reviewed and updated.  DATA REVIEWED: CHART IN EPIC  ROS: Negative unless specifically indicated above in HPI.    Current Outpatient Medications:    atorvastatin (LIPITOR) 40 MG tablet, Take 1 tablet (40 mg total) by mouth at bedtime., Disp: 90 tablet, Rfl: 1   cetirizine (ZYRTEC) 10 MG tablet, TAKE 1 TABLET BY MOUTH EVERY DAY, Disp: 90 tablet, Rfl: 2   diclofenac Sodium (VOLTAREN) 1 % GEL, Apply 2 g topically 4 (four) times daily., Disp: 150 g, Rfl: 3   fluticasone (FLONASE) 50 MCG/ACT nasal spray, USE 2 SPRAYS IN BOTH  NOSTRILS DAILY, Disp: 48 g, Rfl: 3   hyoscyamine (LEVSIN) 0.125 MG tablet, Take 1 tablet (0.125 mg total) by mouth every 4 (four) hours as needed for cramping., Disp: 30 tablet, Rfl: 0   magnesium oxide (  MAG-OX) 400 MG tablet, Take 200 mg by mouth daily., Disp: , Rfl:    metoprolol succinate (TOPROL-XL) 25 MG 24 hr tablet, Take 0.5 tablets (12.5 mg total) by mouth daily., Disp: 90 tablet, Rfl: 1   montelukast (SINGULAIR) 10 MG tablet, TAKE 1 TABLET BY MOUTH  DAILY, Disp: 90 tablet, Rfl: 2   omeprazole (PRILOSEC) 40 MG capsule, Take 1 capsule (40 mg total) by mouth daily., Disp: 90 capsule, Rfl: 3   DULoxetine (CYMBALTA) 30 MG capsule, TAKE 1 CAPSULE BY MOUTH EVERY DAY  (Patient not taking: Reported on 06/30/2020), Disp: 90 capsule, Rfl: 0   Allergies  Allergen Reactions   Hctz [Hydrochlorothiazide] Other (See Comments)    cramping   Past Medical History:  Diagnosis Date   Allergy    Fibromyalgia    GERD (gastroesophageal reflux disease)    Hyperlipidemia    Hypertension    PVC (premature ventricular contraction)     Past Surgical History:  Procedure Laterality Date   ABDOMINAL HYSTERECTOMY     APPENDECTOMY  1985   CATARACT EXTRACTION, BILATERAL     CHOLECYSTECTOMY  2010    Social History   Socioeconomic History   Marital status: Married    Spouse name: [name]   Number of children: 2   Years of education: 10th   Highest education level: GED or equivalent  Occupational History   Occupation: Dietary    Comment: [employer]   Occupation: retired  Tobacco Use   Smoking status: Former    Packs/day: 1.00    Years: 4.00    Pack years: 4.00    Types: Cigarettes    Quit date: 07/25/1976    Years since quitting: 43.9   Smokeless tobacco: Never  Vaping Use   Vaping Use: Never used  Substance and Sexual Activity   Alcohol use: Never   Drug use: Never   Sexual activity: Yes    Birth control/protection: Surgical  Other Topics Concern   Not on file  Social History Narrative   Not on file   Social Determinants of Health   Financial Resource Strain: Not on file  Food Insecurity: Not on file  Transportation Needs: Not on file  Physical Activity: Not on file  Stress: Not on file  Social Connections: Not on file  Intimate Partner Violence: Not on file        Objective:    BP (!) 141/87   Pulse (!) 57   Temp 97.9 F (36.6 C) (Temporal)   Ht $R'4\' 11"'fX$  (1.499 m)   Wt 160 lb 3.2 oz (72.7 kg)   SpO2 96%   BMI 32.36 kg/m   Wt Readings from Last 3 Encounters:  06/30/20 160 lb 3.2 oz (72.7 kg)  03/30/20 161 lb 12.8 oz (73.4 kg)  02/27/20 158 lb 3.2 oz (71.8 kg)    Physical Exam Vitals reviewed.  Constitutional:      General: She  is not in acute distress.    Appearance: Normal appearance. She is obese. She is not ill-appearing, toxic-appearing or diaphoretic.  HENT:     Head: Normocephalic and atraumatic.  Eyes:     General: No scleral icterus.       Right eye: No discharge.        Left eye: No discharge.     Conjunctiva/sclera: Conjunctivae normal.  Cardiovascular:     Rate and Rhythm: Normal rate and regular rhythm.     Heart sounds: Normal heart sounds. No murmur heard.   No friction rub.  No gallop.  Pulmonary:     Effort: Pulmonary effort is normal. No respiratory distress.     Breath sounds: Normal breath sounds. No stridor. No wheezing, rhonchi or rales.  Musculoskeletal:        General: Normal range of motion.     Cervical back: Normal range of motion.  Skin:    General: Skin is warm and dry.     Capillary Refill: Capillary refill takes less than 2 seconds.     Findings: No rash.  Neurological:     General: No focal deficit present.     Mental Status: She is alert and oriented to person, place, and time. Mental status is at baseline.  Psychiatric:        Mood and Affect: Mood normal.        Behavior: Behavior normal.        Thought Content: Thought content normal.        Judgment: Judgment normal.    Lab Results  Component Value Date   TSH 1.860 07/26/2018   Lab Results  Component Value Date   WBC 3.7 09/12/2019   HGB 14.1 09/12/2019   HCT 41.2 09/12/2019   MCV 90 09/12/2019   PLT 237 09/12/2019   Lab Results  Component Value Date   NA 139 02/17/2020   K 4.5 02/17/2020   CO2 24 02/17/2020   GLUCOSE 83 02/17/2020   BUN 15 02/17/2020   CREATININE 0.87 02/17/2020   BILITOT 0.3 02/17/2020   ALKPHOS 60 02/17/2020   AST 16 02/17/2020   ALT 18 02/17/2020   PROT 7.2 02/17/2020   ALBUMIN 4.1 02/17/2020   CALCIUM 9.4 02/17/2020   Lab Results  Component Value Date   CHOL 195 02/17/2020   Lab Results  Component Value Date   HDL 70 02/17/2020   Lab Results  Component Value Date    LDLCALC 112 (H) 02/17/2020   Lab Results  Component Value Date   TRIG 72 02/17/2020   Lab Results  Component Value Date   CHOLHDL 2.8 02/17/2020   No results found for: HGBA1C

## 2020-08-05 DIAGNOSIS — Z23 Encounter for immunization: Secondary | ICD-10-CM | POA: Diagnosis not present

## 2020-08-11 ENCOUNTER — Ambulatory Visit: Payer: Medicare Other | Admitting: Family Medicine

## 2020-08-12 ENCOUNTER — Other Ambulatory Visit: Payer: Self-pay | Admitting: Family Medicine

## 2020-08-12 DIAGNOSIS — Z5181 Encounter for therapeutic drug level monitoring: Secondary | ICD-10-CM | POA: Diagnosis not present

## 2020-08-12 DIAGNOSIS — E782 Mixed hyperlipidemia: Secondary | ICD-10-CM

## 2020-08-12 DIAGNOSIS — M47817 Spondylosis without myelopathy or radiculopathy, lumbosacral region: Secondary | ICD-10-CM | POA: Diagnosis not present

## 2020-08-12 DIAGNOSIS — K219 Gastro-esophageal reflux disease without esophagitis: Secondary | ICD-10-CM

## 2020-08-12 DIAGNOSIS — I10 Essential (primary) hypertension: Secondary | ICD-10-CM

## 2020-08-12 DIAGNOSIS — Z79899 Other long term (current) drug therapy: Secondary | ICD-10-CM | POA: Diagnosis not present

## 2020-08-20 ENCOUNTER — Ambulatory Visit: Payer: Medicare Other | Admitting: Family Medicine

## 2020-08-24 DIAGNOSIS — I493 Ventricular premature depolarization: Secondary | ICD-10-CM | POA: Diagnosis not present

## 2020-08-24 DIAGNOSIS — I10 Essential (primary) hypertension: Secondary | ICD-10-CM | POA: Diagnosis not present

## 2020-08-24 DIAGNOSIS — E782 Mixed hyperlipidemia: Secondary | ICD-10-CM | POA: Diagnosis not present

## 2020-08-30 DIAGNOSIS — M5416 Radiculopathy, lumbar region: Secondary | ICD-10-CM | POA: Diagnosis not present

## 2020-08-30 DIAGNOSIS — M25551 Pain in right hip: Secondary | ICD-10-CM | POA: Diagnosis not present

## 2020-09-06 ENCOUNTER — Ambulatory Visit: Payer: Medicare Other | Admitting: Family Medicine

## 2020-09-14 ENCOUNTER — Ambulatory Visit (INDEPENDENT_AMBULATORY_CARE_PROVIDER_SITE_OTHER): Payer: Medicare Other | Admitting: Family Medicine

## 2020-09-14 ENCOUNTER — Encounter: Payer: Self-pay | Admitting: Family Medicine

## 2020-09-14 ENCOUNTER — Other Ambulatory Visit: Payer: Self-pay

## 2020-09-14 VITALS — BP 151/87 | HR 60 | Temp 97.8°F | Ht 59.0 in | Wt 163.0 lb

## 2020-09-14 DIAGNOSIS — I10 Essential (primary) hypertension: Secondary | ICD-10-CM

## 2020-09-14 DIAGNOSIS — L299 Pruritus, unspecified: Secondary | ICD-10-CM | POA: Diagnosis not present

## 2020-09-14 DIAGNOSIS — H6123 Impacted cerumen, bilateral: Secondary | ICD-10-CM

## 2020-09-14 MED ORDER — AMLODIPINE BESYLATE 2.5 MG PO TABS: 2.5000 mg | ORAL_TABLET | Freq: Every day | ORAL | 2 refills | Status: DC

## 2020-09-14 MED ORDER — LEVOCETIRIZINE DIHYDROCHLORIDE 5 MG PO TABS: 5.0000 mg | ORAL_TABLET | Freq: Every morning | ORAL | 1 refills | Status: DC

## 2020-09-14 NOTE — Progress Notes (Signed)
Assessment & Plan:  1. Essential hypertension Uncontrolled. Encouraged to start amlodipine 2.5 mg once daily. Discussed risks of uncontrolled blood pressure. Education provided on hypertension. - amLODipine (NORVASC) 2.5 MG tablet; Take 1 tablet (2.5 mg total) by mouth daily.  Dispense: 30 tablet; Refill: 2  2. Bilateral impacted cerumen Successfully irrigated.  3. Pruritus Advised to only take 5 mg once daily as she reports she has been taking 10 mg once daily.  - levocetirizine (XYZAL) 5 MG tablet; Take 1 tablet (5 mg total) by mouth every morning.  Dispense: 90 tablet; Refill: 1   Return in about 6 weeks (around 10/26/2020) for follow-up of chronic medication conditions.  Deliah Boston, MSN, APRN, FNP-C [facility]  Subjective:    Patient ID: Nicole Aguirre, female    DOB: 08-10-1950, 70 y.o.   MRN: [id number]  Patient Care Team: Gwenlyn Fudge, FNP as PCP - General (Family Medicine)   Chief Complaint:  Chief Complaint  Patient presents with   Hypertension    6 week follow up     HPI: Nicole Aguirre is a 70 y.o. female presenting on 09/14/2020 for Hypertension (6 week follow up )  Patient is following up on hypertension. At our last visit she was started on amlodipine 5 mg one daily, which she did not start. She was then told by her cardiologist to take amlodipine 2.5 mg once daily if her systolic BP was > 160, which she has also not been doing. She has been keeping a log of her blood pressures at home. Systolic >150 in 51/60 readings. Diastolic > 90 in 6/10 readings.  New complaints: Patient would like her ears cleaned out today.   Social history:  Relevant past medical, surgical, family and social history reviewed and updated as indicated. Interim medical history since our last visit reviewed.  Allergies and medications reviewed and updated.  DATA REVIEWED: CHART IN EPIC  ROS: Negative unless specifically indicated above in  HPI.    Current Outpatient Medications:    amLODipine (NORVASC) 5 MG tablet, Take 1 tablet (5 mg total) by mouth daily., Disp: 30 tablet, Rfl: 2   atorvastatin (LIPITOR) 40 MG tablet, TAKE 1 TABLET BY MOUTH AT  BEDTIME, Disp: 90 tablet, Rfl: 0   diclofenac Sodium (VOLTAREN) 1 % GEL, Apply 2 g topically 4 (four) times daily., Disp: 150 g, Rfl: 3   DULoxetine (CYMBALTA) 30 MG capsule, TAKE 1 CAPSULE BY MOUTH EVERY DAY, Disp: 90 capsule, Rfl: 0   fluticasone (FLONASE) 50 MCG/ACT nasal spray, USE 2 SPRAYS IN BOTH  NOSTRILS DAILY, Disp: 48 g, Rfl: 3   gabapentin (NEURONTIN) 300 MG capsule, Take 300 mg by mouth at bedtime., Disp: , Rfl:    hyoscyamine (LEVSIN) 0.125 MG tablet, Take 1 tablet (0.125 mg total) by mouth every 4 (four) hours as needed for cramping., Disp: 30 tablet, Rfl: 0   levocetirizine (XYZAL) 5 MG tablet, Take 1 tablet (5 mg total) by mouth every evening., Disp: 90 tablet, Rfl: 1   meloxicam (MOBIC) 15 MG tablet, Take 15 mg by mouth every morning., Disp: , Rfl:    metoprolol succinate (TOPROL-XL) 25 MG 24 hr tablet, Take 0.5 tablets (12.5 mg total) by mouth daily., Disp: 90 tablet, Rfl: 1   montelukast (SINGULAIR) 10 MG tablet, TAKE 1 TABLET BY MOUTH  DAILY, Disp: 90 tablet, Rfl: 2   omeprazole (PRILOSEC) 40 MG capsule, TAKE 1 CAPSULE BY MOUTH  DAILY, Disp: 90 capsule, Rfl: 0   Allergies  Allergen Reactions   Hctz [Hydrochlorothiazide] Other (See Comments)    cramping   Past Medical History:  Diagnosis Date   Allergy    Fibromyalgia    GERD (gastroesophageal reflux disease)    Hyperlipidemia    Hypertension    PVC (premature ventricular contraction)     Past Surgical History:  Procedure Laterality Date   ABDOMINAL HYSTERECTOMY     APPENDECTOMY  1985   CATARACT EXTRACTION, BILATERAL     CHOLECYSTECTOMY  2010    Social History   Socioeconomic History   Marital status: Married    Spouse name: Nicole Aguirre   Number of children: 2   Years of education: 10th   Highest  education level: GED or equivalent  Occupational History   Occupation: Dietary    Comment: Adult nurse   Occupation: retired  Tobacco Use   Smoking status: Former    Packs/day: 1.00    Years: 4.00    Pack years: 4.00    Types: Cigarettes    Quit date: 07/25/1976    Years since quitting: 44.1   Smokeless tobacco: Never  Vaping Use   Vaping Use: Never used  Substance and Sexual Activity   Alcohol use: Never   Drug use: Never   Sexual activity: Yes    Birth control/protection: Surgical  Other Topics Concern   Not on file  Social History Narrative   Not on file   Social Determinants of Health   Financial Resource Strain: Not on file  Food Insecurity: Not on file  Transportation Needs: Not on file  Physical Activity: Not on file  Stress: Not on file  Social Connections: Not on file  Intimate Partner Violence: Not on file        Objective:    BP (!) 151/87   Pulse 60   Temp 97.8 F (36.6 C) (Temporal)   Ht $R'4\' 11"'KK$  (1.499 m)   Wt 163 lb (73.9 kg)   SpO2 96%   BMI 32.92 kg/m   Wt Readings from Last 3 Encounters:  09/14/20 163 lb (73.9 kg)  06/30/20 160 lb 3.2 oz (72.7 kg)  03/30/20 161 lb 12.8 oz (73.4 kg)    Physical Exam Vitals reviewed.  Constitutional:      General: She is not in acute distress.    Appearance: Normal appearance. She is obese. She is not ill-appearing, toxic-appearing or diaphoretic.  HENT:     Head: Normocephalic and atraumatic.     Right Ear: Ear canal and external ear normal. There is impacted cerumen.     Left Ear: Ear canal and external ear normal. There is impacted cerumen.  Eyes:     General: No scleral icterus.       Right eye: No discharge.        Left eye: No discharge.     Conjunctiva/sclera: Conjunctivae normal.  Cardiovascular:     Rate and Rhythm: Normal rate and regular rhythm.     Heart sounds: Normal heart sounds. No murmur heard.   No friction rub. No gallop.  Pulmonary:     Effort: Pulmonary effort is normal. No  respiratory distress.     Breath sounds: Normal breath sounds. No stridor. No wheezing, rhonchi or rales.  Musculoskeletal:        General: Normal range of motion.     Cervical back: Normal range of motion.  Skin:    General: Skin is warm and dry.     Capillary Refill: Capillary refill takes less than 2 seconds.  Neurological:     General: No focal deficit present.     Mental Status: She is alert and oriented to person, place, and time. Mental status is at baseline.  Psychiatric:        Mood and Affect: Mood normal.        Behavior: Behavior normal.        Thought Content: Thought content normal.        Judgment: Judgment normal.    Lab Results  Component Value Date   TSH 1.860 07/26/2018   Lab Results  Component Value Date   WBC 4.7 06/30/2020   HGB 14.3 06/30/2020   HCT 41.8 06/30/2020   MCV 92 06/30/2020   PLT 246 06/30/2020   Lab Results  Component Value Date   NA 140 06/30/2020   K 4.2 06/30/2020   CO2 21 06/30/2020   GLUCOSE 83 06/30/2020   BUN 9 06/30/2020   CREATININE 0.83 06/30/2020   BILITOT 0.4 06/30/2020   ALKPHOS 71 06/30/2020   AST 15 06/30/2020   ALT 15 06/30/2020   PROT 7.1 06/30/2020   ALBUMIN 4.2 06/30/2020   CALCIUM 9.6 06/30/2020   EGFR 77 06/30/2020   Lab Results  Component Value Date   CHOL 206 (H) 06/30/2020   Lab Results  Component Value Date   HDL 79 06/30/2020   Lab Results  Component Value Date   LDLCALC 110 (H) 06/30/2020   Lab Results  Component Value Date   TRIG 95 06/30/2020   Lab Results  Component Value Date   CHOLHDL 2.6 06/30/2020   No results found for: HGBA1C

## 2020-09-14 NOTE — Patient Instructions (Signed)
Reminder: please return after mid-September for your flu shot. If you receive this elsewhere, such as your pharmacy, please let us know so we can get this documented in your chart.   

## 2020-09-17 DIAGNOSIS — H5213 Myopia, bilateral: Secondary | ICD-10-CM | POA: Diagnosis not present

## 2020-09-30 DIAGNOSIS — M47817 Spondylosis without myelopathy or radiculopathy, lumbosacral region: Secondary | ICD-10-CM | POA: Diagnosis not present

## 2020-10-26 ENCOUNTER — Ambulatory Visit (INDEPENDENT_AMBULATORY_CARE_PROVIDER_SITE_OTHER): Payer: Medicare Other | Admitting: Family Medicine

## 2020-10-26 ENCOUNTER — Encounter: Payer: Self-pay | Admitting: Family Medicine

## 2020-10-26 DIAGNOSIS — E782 Mixed hyperlipidemia: Secondary | ICD-10-CM

## 2020-10-26 DIAGNOSIS — I10 Essential (primary) hypertension: Secondary | ICD-10-CM

## 2020-10-26 DIAGNOSIS — J302 Other seasonal allergic rhinitis: Secondary | ICD-10-CM | POA: Diagnosis not present

## 2020-10-26 MED ORDER — AMLODIPINE BESYLATE 5 MG PO TABS: 5.0000 mg | ORAL_TABLET | Freq: Every day | ORAL | 2 refills | Status: DC

## 2020-10-26 NOTE — Progress Notes (Signed)
Virtual Visit via Telephone Note  I connected with Nicole Aguirre on 10/26/20 at 9:29 AM by telephone and verified that I am speaking with the correct person using two identifiers. Nicole Aguirre is currently located at home and her husband is currently with her during this visit. The provider, [name], FNP is located in their office at time of visit.  I discussed the limitations, risks, security and privacy concerns of performing an evaluation and management service by telephone and the availability of in person appointments. I also discussed with the patient that there may be a patient responsible charge related to this service. The patient expressed understanding and agreed to proceed.  Subjective: PCP: [name], FNP  Chief Complaint  Patient presents with   Medical Management of Chronic Issues   Hypertension: patient has been taking amlodipine 2.5 mg as previously discussed. She is checking her blood pressure at home and reports readings BEFORE taking medication of 156-183/88-102. States the medication brings her systolic down to 009-233A.   Hyperlipidemia: taking atorvastatin 40 mg daily. After last lab work, she wanted to work on lifestyle changes before changing her dosage again.   Allergies: patient reports poor control of her allergies on Xyzal 5 mg QD, Flonase 2 sprays QD, and Singulair 10 mg QD. She was previously taking Zyrtec 10 mg QD which was also not helpful.    ROS: Per HPI  Current Outpatient Medications:    amLODipine (NORVASC) 2.5 MG tablet, Take 1 tablet (2.5 mg total) by mouth daily., Disp: 30 tablet, Rfl: 2   atorvastatin (LIPITOR) 40 MG tablet, TAKE 1 TABLET BY MOUTH AT  BEDTIME, Disp: 90 tablet, Rfl: 0   diclofenac Sodium (VOLTAREN) 1 % GEL, Apply 2 g topically 4 (four) times daily., Disp: 150 g, Rfl: 3   fluticasone (FLONASE) 50 MCG/ACT nasal spray, USE 2 SPRAYS IN BOTH  NOSTRILS DAILY, Disp: 48 g, Rfl: 3   hyoscyamine (LEVSIN) 0.125  MG tablet, Take 1 tablet (0.125 mg total) by mouth every 4 (four) hours as needed for cramping., Disp: 30 tablet, Rfl: 0   levocetirizine (XYZAL) 5 MG tablet, Take 1 tablet (5 mg total) by mouth every morning., Disp: 90 tablet, Rfl: 1   Magnesium 200 MG TABS, Take 200 mg by mouth daily., Disp: , Rfl:    metoprolol succinate (TOPROL-XL) 25 MG 24 hr tablet, Take 0.5 tablets (12.5 mg total) by mouth daily., Disp: 90 tablet, Rfl: 1   montelukast (SINGULAIR) 10 MG tablet, TAKE 1 TABLET BY MOUTH  DAILY, Disp: 90 tablet, Rfl: 2   omeprazole (PRILOSEC) 40 MG capsule, TAKE 1 CAPSULE BY MOUTH  DAILY, Disp: 90 capsule, Rfl: 0  Allergies  Allergen Reactions   Hctz [Hydrochlorothiazide] Other (See Comments)    cramping   Past Medical History:  Diagnosis Date   Allergy    Fibromyalgia    GERD (gastroesophageal reflux disease)    Hyperlipidemia    Hypertension    PVC (premature ventricular contraction)     Observations/Objective: A&O  No respiratory distress or wheezing audible over the phone Mood, judgement, and thought processes all WNL   Assessment and Plan: 1. Essential hypertension Patient is going to increase dose of amlodipine from 2.5 mg to 5 mg daily. Encouraged to monitor BP after taking medication so that we know how much it is helping for future adjustments in medication. - amLODipine (NORVASC) 5 MG tablet; Take 1 tablet (5 mg total) by mouth daily.  Dispense: 30 tablet; Refill: 2 -  CMP14+EGFR; Future - Lipid panel; Future  2. Mixed hyperlipidemia Labs to assess. - CMP14+EGFR; Future - Lipid panel; Future  3. Seasonal allergies Uncontrolled. Referring to allergist.  - Ambulatory referral to Allergy  Patient is going to get lab work completed on 11/08/20 when she comes for her flu shot.   Follow Up Instructions: Return in about 6 weeks (around 12/07/2020) for HTN.  I discussed the assessment and treatment plan with the patient. The patient was provided an opportunity to  ask questions and all were answered. The patient agreed with the plan and demonstrated an understanding of the instructions.   The patient was advised to call back or seek an in-person evaluation if the symptoms worsen or if the condition fails to improve as anticipated.  The above assessment and management plan was discussed with the patient. The patient verbalized understanding of and has agreed to the management plan. Patient is aware to call the clinic if symptoms persist or worsen. Patient is aware when to return to the clinic for a follow-up visit. Patient educated on when it is appropriate to go to the emergency department.   Time call ended: 9:45 AM  I provided 16 minutes of non-face-to-face time during this encounter.  [name], MSN, APRN, FNP-C [facility] 10/26/20

## 2020-10-29 ENCOUNTER — Telehealth: Payer: Self-pay

## 2020-10-29 NOTE — Telephone Encounter (Signed)
Patient called stating her BP was 169/99 with a headache  Per [name] patient was to increase her amlodipine to 10mg  Patient verbalized understanding.

## 2020-11-08 ENCOUNTER — Ambulatory Visit (INDEPENDENT_AMBULATORY_CARE_PROVIDER_SITE_OTHER): Payer: Medicare Other

## 2020-11-08 ENCOUNTER — Other Ambulatory Visit: Payer: Medicare Other

## 2020-11-08 ENCOUNTER — Other Ambulatory Visit: Payer: Self-pay

## 2020-11-08 DIAGNOSIS — I10 Essential (primary) hypertension: Secondary | ICD-10-CM | POA: Diagnosis not present

## 2020-11-08 DIAGNOSIS — E782 Mixed hyperlipidemia: Secondary | ICD-10-CM | POA: Diagnosis not present

## 2020-11-08 DIAGNOSIS — Z23 Encounter for immunization: Secondary | ICD-10-CM

## 2020-11-08 LAB — CMP14+EGFR
ALT: 122 IU/L — ABNORMAL HIGH (ref 0–32)
AST: 85 IU/L — ABNORMAL HIGH (ref 0–40)
Albumin/Globulin Ratio: 1.3 (ref 1.2–2.2)
Albumin: 4.3 g/dL (ref 3.8–4.8)
Alkaline Phosphatase: 252 IU/L — ABNORMAL HIGH (ref 44–121)
BUN/Creatinine Ratio: 14 (ref 12–28)
BUN: 12 mg/dL (ref 8–27)
Bilirubin Total: 0.7 mg/dL (ref 0.0–1.2)
CO2: 26 mmol/L (ref 20–29)
Calcium: 9.8 mg/dL (ref 8.7–10.3)
Chloride: 103 mmol/L (ref 96–106)
Creatinine, Ser: 0.83 mg/dL (ref 0.57–1.00)
Globulin, Total: 3.2 g/dL (ref 1.5–4.5)
Glucose: 86 mg/dL (ref 70–99)
Potassium: 4.4 mmol/L (ref 3.5–5.2)
Sodium: 141 mmol/L (ref 134–144)
Total Protein: 7.5 g/dL (ref 6.0–8.5)
eGFR: 77 mL/min/{1.73_m2} (ref >59)

## 2020-11-08 LAB — LIPID PANEL
Chol/HDL Ratio: 2.6 ratio (ref 0.0–4.4)
Cholesterol, Total: 208 mg/dL — ABNORMAL HIGH (ref 100–199)
HDL: 81 mg/dL (ref >39)
LDL Chol Calc (NIH): 110 mg/dL — ABNORMAL HIGH (ref 0–99)
Triglycerides: 100 mg/dL (ref 0–149)
VLDL Cholesterol Cal: 17 mg/dL (ref 5–40)

## 2020-11-09 ENCOUNTER — Ambulatory Visit (INDEPENDENT_AMBULATORY_CARE_PROVIDER_SITE_OTHER): Payer: Medicare Other | Admitting: Nurse Practitioner

## 2020-11-09 ENCOUNTER — Telehealth: Payer: Self-pay | Admitting: Family Medicine

## 2020-11-09 ENCOUNTER — Encounter: Payer: Self-pay | Admitting: Nurse Practitioner

## 2020-11-09 DIAGNOSIS — J0110 Acute frontal sinusitis, unspecified: Secondary | ICD-10-CM

## 2020-11-09 MED ORDER — AMOXICILLIN-POT CLAVULANATE 875-125 MG PO TABS: 1.0000 | ORAL_TABLET | Freq: Two times a day (BID) | ORAL | 0 refills | Status: DC

## 2020-11-09 NOTE — Progress Notes (Signed)
   Virtual Visit  Note Due to COVID-19 pandemic this visit was conducted virtually. This visit type was conducted due to national recommendations for restrictions regarding the COVID-19 Pandemic (e.g. social distancing, sheltering in place) in an effort to limit this patient's exposure and mitigate transmission in our community. All issues noted in this document were discussed and addressed.  A physical exam was not performed with this format.  I connected with [name] on 11/09/20 at 1:10 PM by telephone and verified that I am speaking with the correct person using two identifiers. Nicole Aguirre is currently located at home during visit. The provider, Ivy Lynn, NP is located in their office at time of visit.  I discussed the limitations, risks, security and privacy concerns of performing an evaluation and management service by telephone and the availability of in person appointments. I also discussed with the patient that there may be a patient responsible charge related to this service. The patient expressed understanding and agreed to proceed.   History and Present Illness:  Sinusitis This is a recurrent problem. The current episode started in the past 7 days. The problem is unchanged. There has been no fever. Associated symptoms include congestion, coughing, headaches and sinus pressure. Pertinent negatives include no chills or sore throat. Past treatments include nothing.     Review of Systems  Constitutional:  Negative for chills, fever and malaise/fatigue.  HENT:  Positive for congestion and sinus pressure. Negative for sore throat.   Respiratory:  Positive for cough.   Skin:  Negative for rash.  Neurological:  Positive for headaches.  All other systems reviewed and are negative.   Observations/Objective: Televisit patient not in distress.  Assessment and Plan: Take meds as prescribed - Use a cool mist humidifier  -Use saline nose sprays  frequently -Force fluids -For fever or aches or pains- take Tylenol or ibuprofen. -Augmentin 875-125 mg tablet by mouth. -Education provided to patient, Rx sent to pharmacy. -If symptoms do not improve, she may need to be COVID tested to rule this out Follow up with worsening unresolved symptoms   Follow Up Instructions: Follow-up with unresolved symptoms.    I discussed the assessment and treatment plan with the patient. The patient was provided an opportunity to ask questions and all were answered. The patient agreed with the plan and demonstrated an understanding of the instructions.   The patient was advised to call back or seek an in-person evaluation if the symptoms worsen or if the condition fails to improve as anticipated.  The above assessment and management plan was discussed with the patient. The patient verbalized understanding of and has agreed to the management plan. Patient is aware to call the clinic if symptoms persist or worsen. Patient is aware when to return to the clinic for a follow-up visit. Patient educated on when it is appropriate to go to the emergency department.   Time call ended: 1:20 PM  I provided 10 minutes of  non face-to-face time during this encounter.    Ivy Lynn, NP

## 2020-11-09 NOTE — Assessment & Plan Note (Signed)
Take meds as prescribed - Use a cool mist humidifier  -Use saline nose sprays frequently -Force fluids -For fever or aches or pains- take Tylenol or ibuprofen. -Augmentin 875-125 mg tablet by mouth. -Education provided to patient, Rx sent to pharmacy. -If symptoms do not improve, she may need to be COVID tested to rule this out Follow up with worsening unresolved symptoms

## 2020-11-09 NOTE — Patient Instructions (Signed)

## 2020-11-09 NOTE — Telephone Encounter (Signed)
Pt calling to let us know that she took an at home covid test and it was negative.

## 2020-11-11 NOTE — Telephone Encounter (Signed)
Patient aware and verbalizes understanding. 

## 2020-11-15 ENCOUNTER — Other Ambulatory Visit: Payer: Self-pay | Admitting: *Deleted

## 2020-11-16 NOTE — Progress Notes (Signed)
Pt calling back about labs and recommendations. Please call back

## 2020-11-16 NOTE — Telephone Encounter (Signed)
Lmtcb   There is also lab results that need to be reviewed

## 2020-11-16 NOTE — Telephone Encounter (Signed)
Pt would like to speak to [facility] or her nurse about the increase of medication. Please call back and advise.

## 2020-11-18 ENCOUNTER — Telehealth: Payer: Self-pay | Admitting: Family Medicine

## 2020-11-19 NOTE — Telephone Encounter (Signed)
Refer to lab result  

## 2020-11-30 ENCOUNTER — Other Ambulatory Visit: Payer: Self-pay | Admitting: Family Medicine

## 2020-11-30 NOTE — Telephone Encounter (Signed)
I have in my notes that she quit taking this because it wasn't helping. Can we get some clarification from the patient?

## 2020-12-01 NOTE — Telephone Encounter (Signed)
Lmtcb.

## 2020-12-01 NOTE — Telephone Encounter (Signed)
Patient states that Dr. Lowella Dell put her back on it since it would help with her leg pains.  States she only see's her PRN that is why she is asking [place] for the refill request.

## 2020-12-03 ENCOUNTER — Telehealth: Payer: Self-pay | Admitting: Family Medicine

## 2020-12-03 MED ORDER — MELOXICAM 15 MG PO TABS: 15.0000 mg | ORAL_TABLET | Freq: Every morning | ORAL | 1 refills | Status: DC

## 2020-12-03 NOTE — Telephone Encounter (Signed)
Pt calling to see if Karsten Fells will be calling in gabapentin (NEURONTIN) 300 MG capsule & meloxicam (MOBIC) 15 MG tablet for her. Could not attach to other note from 11/15. Please call back and advise.

## 2020-12-03 NOTE — Telephone Encounter (Signed)
Patient aware and verbalized understanding. °

## 2020-12-03 NOTE — Telephone Encounter (Signed)
I have not received a refill request for meloxicam, but am happy to send that in. I refilled gabapentin on 12/01/2020 in refill encounter dated 11/30/2020.

## 2020-12-07 ENCOUNTER — Other Ambulatory Visit: Payer: Self-pay

## 2020-12-07 ENCOUNTER — Ambulatory Visit (INDEPENDENT_AMBULATORY_CARE_PROVIDER_SITE_OTHER): Payer: Medicare Other | Admitting: Family Medicine

## 2020-12-07 ENCOUNTER — Encounter: Payer: Self-pay | Admitting: Family Medicine

## 2020-12-07 VITALS — BP 131/79 | HR 61 | Temp 97.8°F | Wt 161.0 lb

## 2020-12-07 DIAGNOSIS — I10 Essential (primary) hypertension: Secondary | ICD-10-CM | POA: Diagnosis not present

## 2020-12-07 DIAGNOSIS — R748 Abnormal levels of other serum enzymes: Secondary | ICD-10-CM

## 2020-12-07 LAB — CMP14+EGFR
ALT: 44 IU/L — ABNORMAL HIGH (ref 0–32)
AST: 41 IU/L — ABNORMAL HIGH (ref 0–40)
Albumin/Globulin Ratio: 1.5 (ref 1.2–2.2)
Albumin: 4.1 g/dL (ref 3.8–4.8)
Alkaline Phosphatase: 184 IU/L — ABNORMAL HIGH (ref 44–121)
BUN/Creatinine Ratio: 16 (ref 12–28)
BUN: 13 mg/dL (ref 8–27)
Bilirubin Total: 0.4 mg/dL (ref 0.0–1.2)
CO2: 24 mmol/L (ref 20–29)
Calcium: 9.5 mg/dL (ref 8.7–10.3)
Chloride: 102 mmol/L (ref 96–106)
Creatinine, Ser: 0.81 mg/dL (ref 0.57–1.00)
Globulin, Total: 2.7 g/dL (ref 1.5–4.5)
Glucose: 84 mg/dL (ref 70–99)
Potassium: 4.3 mmol/L (ref 3.5–5.2)
Sodium: 138 mmol/L (ref 134–144)
Total Protein: 6.8 g/dL (ref 6.0–8.5)
eGFR: 79 mL/min/{1.73_m2} (ref >59)

## 2020-12-07 MED ORDER — AMLODIPINE BESYLATE 10 MG PO TABS: 10.0000 mg | ORAL_TABLET | Freq: Every day | ORAL | 1 refills | Status: DC

## 2020-12-07 NOTE — Progress Notes (Signed)
Assessment & Plan:  1. Essential hypertension Uncontrolled, but improving. Amlodipine increased from 5 mg to 10 mg daily. Education provided on the DASH diet. Patient will continue monitoring her BP at home while keeping a log to bring with her to her next appointment. - CMP14+EGFR - amLODipine (NORVASC) 10 MG tablet; Take 1 tablet (10 mg total) by mouth daily.  Dispense: 90 tablet; Refill: 1  2. Elevated liver enzymes Rechecking enzymes today. If still significantly elevated, will add further work-up. - CMP14+EGFR    Return in about 2 months (around 02/06/2021) for follow-up of chronic medication conditions.  [name], MSN, APRN, FNP-C [facility]  Subjective:    Patient ID: Nicole Aguirre, female    DOB: 04-20-1950, 70 y.o.   MRN: [id number]  Patient Care Team: [name], FNP as PCP - General (Family Medicine)   Chief Complaint:  Chief Complaint  Patient presents with   Hypertension    HPI: Nicole Aguirre is a 70 y.o. female presenting on 12/07/2020 for Hypertension  Hypertension: patient's amlodipine was increased to 5 mg daily at our last visit. She had called asking for an increase to 10 mg, but did not actually increase since her readings came down. She does monitor her blood pressure at home and has readings 157-262/03-55 with 9/74 systolic >163 and 8/45 diastolic >36.  Elevated liver enzymes: patient previously advised to stop taking Tylenol and we would recheck labs at this visit.   New complaints: None   Social history:  Relevant past medical, surgical, family and social history reviewed and updated as indicated. Interim medical history since our last visit reviewed.  Allergies and medications reviewed and updated.  DATA REVIEWED: CHART IN EPIC  ROS: Negative unless specifically indicated above in HPI.    Current Outpatient Medications:    amLODipine (NORVASC) 5 MG tablet, Take 1 tablet (5 mg total) by mouth  daily., Disp: 30 tablet, Rfl: 2   amoxicillin-clavulanate (AUGMENTIN) 875-125 MG tablet, Take 1 tablet by mouth 2 (two) times daily., Disp: 14 tablet, Rfl: 0   atorvastatin (LIPITOR) 40 MG tablet, TAKE 1 TABLET BY MOUTH AT  BEDTIME, Disp: 90 tablet, Rfl: 0   cetirizine (ZYRTEC) 10 MG tablet, Take 10 mg by mouth daily., Disp: , Rfl:    diclofenac Sodium (VOLTAREN) 1 % GEL, Apply 2 g topically 4 (four) times daily., Disp: 150 g, Rfl: 3   fluticasone (FLONASE) 50 MCG/ACT nasal spray, USE 2 SPRAYS IN BOTH  NOSTRILS DAILY, Disp: 48 g, Rfl: 3   gabapentin (NEURONTIN) 300 MG capsule, TAKE 1 CAPSULE BY MOUTH AT BEDTIME, Disp: 30 capsule, Rfl: 2   hyoscyamine (LEVSIN) 0.125 MG tablet, Take 1 tablet (0.125 mg total) by mouth every 4 (four) hours as needed for cramping., Disp: 30 tablet, Rfl: 0   Magnesium 200 MG TABS, Take 200 mg by mouth daily., Disp: , Rfl:    meloxicam (MOBIC) 15 MG tablet, Take 1 tablet (15 mg total) by mouth every morning., Disp: 90 tablet, Rfl: 1   metoprolol succinate (TOPROL-XL) 25 MG 24 hr tablet, Take 0.5 tablets (12.5 mg total) by mouth daily., Disp: 90 tablet, Rfl: 1   montelukast (SINGULAIR) 10 MG tablet, TAKE 1 TABLET BY MOUTH  DAILY, Disp: 90 tablet, Rfl: 2   omeprazole (PRILOSEC) 40 MG capsule, TAKE 1 CAPSULE BY MOUTH  DAILY, Disp: 90 capsule, Rfl: 0   Allergies  Allergen Reactions   Hctz [Hydrochlorothiazide] Other (See Comments)    cramping   Past  Medical History:  Diagnosis Date   Allergy    Fibromyalgia    GERD (gastroesophageal reflux disease)    Hyperlipidemia    Hypertension    PVC (premature ventricular contraction)     Past Surgical History:  Procedure Laterality Date   ABDOMINAL HYSTERECTOMY     APPENDECTOMY  1985   CATARACT EXTRACTION, BILATERAL     CHOLECYSTECTOMY  2010    Social History   Socioeconomic History   Marital status: Married    Spouse name: [name]   Number of children: 2   Years of education: 10th   Highest education level: GED or  equivalent  Occupational History   Occupation: Dietary    Comment: Adult nurse   Occupation: retired  Tobacco Use   Smoking status: Former    Packs/day: 1.00    Years: 4.00    Pack years: 4.00    Types: Cigarettes    Quit date: 07/25/1976    Years since quitting: 44.4   Smokeless tobacco: Never  Vaping Use   Vaping Use: Never used  Substance and Sexual Activity   Alcohol use: Never   Drug use: Never   Sexual activity: Yes    Birth control/protection: Surgical  Other Topics Concern   Not on file  Social History Narrative   Not on file   Social Determinants of Health   Financial Resource Strain: Not on file  Food Insecurity: Not on file  Transportation Needs: Not on file  Physical Activity: Not on file  Stress: Not on file  Social Connections: Not on file  Intimate Partner Violence: Not on file        Objective:    BP 131/79   Pulse 61   Temp 97.8 F (36.6 C)   Wt 161 lb (73 kg)   SpO2 98%   BMI 32.52 kg/m   Wt Readings from Last 3 Encounters:  12/07/20 161 lb (73 kg)  09/14/20 163 lb (73.9 kg)  06/30/20 160 lb 3.2 oz (72.7 kg)    Physical Exam Vitals reviewed.  Constitutional:      General: She is not in acute distress.    Appearance: Normal appearance. She is obese. She is not ill-appearing, toxic-appearing or diaphoretic.  HENT:     Head: Normocephalic and atraumatic.  Eyes:     General: No scleral icterus.       Right eye: No discharge.        Left eye: No discharge.     Conjunctiva/sclera: Conjunctivae normal.  Cardiovascular:     Rate and Rhythm: Normal rate and regular rhythm.     Heart sounds: Normal heart sounds. No murmur heard.   No friction rub. No gallop.  Pulmonary:     Effort: Pulmonary effort is normal. No respiratory distress.     Breath sounds: Normal breath sounds. No stridor. No wheezing, rhonchi or rales.  Musculoskeletal:        General: Normal range of motion.     Cervical back: Normal range of motion.  Skin:    General:  Skin is warm and dry.     Capillary Refill: Capillary refill takes less than 2 seconds.  Neurological:     General: No focal deficit present.     Mental Status: She is alert and oriented to person, place, and time. Mental status is at baseline.  Psychiatric:        Mood and Affect: Mood normal.        Behavior: Behavior normal.  Thought Content: Thought content normal.        Judgment: Judgment normal.    Lab Results  Component Value Date   TSH 1.860 07/26/2018   Lab Results  Component Value Date   WBC 4.7 06/30/2020   HGB 14.3 06/30/2020   HCT 41.8 06/30/2020   MCV 92 06/30/2020   PLT 246 06/30/2020   Lab Results  Component Value Date   NA 141 11/08/2020   K 4.4 11/08/2020   CO2 26 11/08/2020   GLUCOSE 86 11/08/2020   BUN 12 11/08/2020   CREATININE 0.83 11/08/2020   BILITOT 0.7 11/08/2020   ALKPHOS 252 (H) 11/08/2020   AST 85 (H) 11/08/2020   ALT 122 (H) 11/08/2020   PROT 7.5 11/08/2020   ALBUMIN 4.3 11/08/2020   CALCIUM 9.8 11/08/2020   EGFR 77 11/08/2020   Lab Results  Component Value Date   CHOL 208 (H) 11/08/2020   Lab Results  Component Value Date   HDL 81 11/08/2020   Lab Results  Component Value Date   LDLCALC 110 (H) 11/08/2020   Lab Results  Component Value Date   TRIG 100 11/08/2020   Lab Results  Component Value Date   CHOLHDL 2.6 11/08/2020   No results found for: HGBA1C

## 2020-12-12 ENCOUNTER — Other Ambulatory Visit: Payer: Self-pay | Admitting: Family Medicine

## 2020-12-12 DIAGNOSIS — E782 Mixed hyperlipidemia: Secondary | ICD-10-CM

## 2020-12-13 ENCOUNTER — Ambulatory Visit (INDEPENDENT_AMBULATORY_CARE_PROVIDER_SITE_OTHER): Payer: Medicare Other | Admitting: Family

## 2020-12-13 ENCOUNTER — Encounter: Payer: Self-pay | Admitting: Family

## 2020-12-13 VITALS — BP 134/80 | HR 63 | Temp 97.5°F | Ht 59.0 in | Wt 159.0 lb

## 2020-12-13 DIAGNOSIS — N390 Urinary tract infection, site not specified: Secondary | ICD-10-CM

## 2020-12-13 LAB — MICROSCOPIC EXAMINATION: Renal Epithel, UA: NONE SEEN /hpf

## 2020-12-13 LAB — URINALYSIS, COMPLETE
Bilirubin, UA: NEGATIVE
Glucose, UA: NEGATIVE
Ketones, UA: NEGATIVE
Nitrite, UA: NEGATIVE
Protein,UA: NEGATIVE
RBC, UA: NEGATIVE
Specific Gravity, UA: <1.005 — ABNORMAL LOW (ref 1.005–1.030)
Urobilinogen, Ur: 0.2 mg/dL (ref 0.2–1.0)
pH, UA: 6.5 (ref 5.0–7.5)

## 2020-12-13 MED ORDER — CEPHALEXIN 500 MG PO CAPS: 500.0000 mg | ORAL_CAPSULE | Freq: Two times a day (BID) | ORAL | 0 refills | Status: DC

## 2020-12-13 NOTE — Progress Notes (Signed)
Subjective:    Patient ID: Nicole Aguirre, female    DOB: 07/06/1950, 70 y.o.   MRN: [id number]  Chief Complaint  Patient presents with   Urinary Tract Infection   Dysuria   Pelvic Pain    Urinary Tract Infection  Associated symptoms include flank pain, frequency and urgency. Pertinent negatives include no discharge, hematuria, hesitancy, nausea or vomiting.  Dysuria  This is a new problem. The current episode started in the past 7 days. The problem occurs intermittently. The problem has been gradually worsening. The quality of the pain is described as burning. The pain is at a severity of 10/10. The pain is mild. Associated symptoms include flank pain, frequency and urgency. Pertinent negatives include no discharge, hematuria, hesitancy, nausea or vomiting. She has tried increased fluids for the symptoms. The treatment provided mild relief.  Pelvic Pain The patient's primary symptoms include pelvic pain. Associated symptoms include dysuria, flank pain, frequency and urgency. Pertinent negatives include no hematuria, nausea or vomiting.     Review of Systems  Gastrointestinal:  Negative for nausea and vomiting.  Genitourinary:  Positive for dysuria, flank pain, frequency, pelvic pain and urgency. Negative for hematuria and hesitancy.  All other systems reviewed and are negative.     Objective:   Physical Exam Vitals reviewed.  Constitutional:      General: She is not in acute distress.    Appearance: She is well-developed.  HENT:     Head: Normocephalic and atraumatic.  Eyes:     Pupils: Pupils are equal, round, and reactive to light.  Neck:     Thyroid: No thyromegaly.  Cardiovascular:     Rate and Rhythm: Normal rate and regular rhythm.     Heart sounds: Normal heart sounds. No murmur heard. Pulmonary:     Effort: Pulmonary effort is normal. No respiratory distress.     Breath sounds: Normal breath sounds. No wheezing.  Abdominal:     General: Bowel sounds are  normal. There is no distension.     Palpations: Abdomen is soft.     Tenderness: There is no abdominal tenderness.  Musculoskeletal:        General: No tenderness. Normal range of motion.     Cervical back: Normal range of motion and neck supple.  Skin:    General: Skin is warm and dry.  Neurological:     Mental Status: She is alert and oriented to person, place, and time.     Cranial Nerves: No cranial nerve deficit.     Deep Tendon Reflexes: Reflexes are normal and symmetric.  Psychiatric:        Behavior: Behavior normal.        Thought Content: Thought content normal.        Judgment: Judgment normal.     BP 134/80   Pulse 63   Temp (!) 97.5 F (36.4 C) (Temporal)   Ht 4\' 11"  (1.499 m)   Wt 159 lb (72.1 kg)   BMI 32.11 kg/m       Assessment & Plan:  Nicole Aguirre comes in today with chief complaint of Urinary Tract Infection, Dysuria, and Pelvic Pain   Diagnosis and orders addressed:  1. Urinary tract infection without hematuria, site unspecified Force fluids AZO over the counter X2 days RTO if symptoms worsen or do not improve Culture pending - Urine Culture - Urinalysis, Complete - cephALEXin (KEFLEX) 500 MG capsule; Take 1 capsule (500 mg total) by mouth 2 (two) times daily.  Dispense: 14  capsule; Refill: 0   Evelina Dun, FNP

## 2020-12-13 NOTE — Patient Instructions (Signed)
Urinary Tract Infection, Adult A urinary tract infection (UTI) is an infection of any part of the urinary tract. The urinary tract includes the kidneys, ureters, bladder, and urethra. These organs make, store, and get rid of urine in the body. An upper UTI affects the ureters and kidneys. A lower UTI affects the bladder and urethra. What are the causes? Most urinary tract infections are caused by bacteria in your genital area around your urethra, where urine leaves your body. These bacteria grow and cause inflammation of your urinary tract. What increases the risk? You are more likely to develop this condition if: You have a urinary catheter that stays in place. You are not able to control when you urinate or have a bowel movement (incontinence). You are female and you: Use a spermicide or diaphragm for birth control. Have low estrogen levels. Are pregnant. You have certain genes that increase your risk. You are sexually active. You take antibiotic medicines. You have a condition that causes your flow of urine to slow down, such as: An enlarged prostate, if you are female. Blockage in your urethra. A kidney stone. A nerve condition that affects your bladder control (neurogenic bladder). Not getting enough to drink, or not urinating often. You have certain medical conditions, such as: Diabetes. A weak disease-fighting system (immunesystem). Sickle cell disease. Gout. Spinal cord injury. What are the signs or symptoms? Symptoms of this condition include: Needing to urinate right away (urgency). Frequent urination. This may include small amounts of urine each time you urinate. Pain or burning with urination. Blood in the urine. Urine that smells bad or unusual. Trouble urinating. Cloudy urine. Vaginal discharge, if you are female. Pain in the abdomen or the lower back. You may also have: Vomiting or a decreased appetite. Confusion. Irritability or tiredness. A fever or  chills. Diarrhea. The first symptom in older adults may be confusion. In some cases, they may not have any symptoms until the infection has worsened. How is this diagnosed? This condition is diagnosed based on your medical history and a physical exam. You may also have other tests, including: Urine tests. Blood tests. Tests for STIs (sexually transmitted infections). If you have had more than one UTI, a cystoscopy or imaging studies may be done to determine the cause of the infections. How is this treated? Treatment for this condition includes: Antibiotic medicine. Over-the-counter medicines to treat discomfort. Drinking enough water to stay hydrated. If you have frequent infections or have other conditions such as a kidney stone, you may need to see a health care provider who specializes in the urinary tract (urologist). In rare cases, urinary tract infections can cause sepsis. Sepsis is a life-threatening condition that occurs when the body responds to an infection. Sepsis is treated in the hospital with IV antibiotics, fluids, and other medicines. Follow these instructions at home: Medicines Take over-the-counter and prescription medicines only as told by your health care provider. If you were prescribed an antibiotic medicine, take it as told by your health care provider. Do not stop using the antibiotic even if you start to feel better. General instructions Make sure you: Empty your bladder often and completely. Do not hold urine for long periods of time. Empty your bladder after sex. Wipe from front to back after urinating or having a bowel movement if you are female. Use each tissue only one time when you wipe. Drink enough fluid to keep your urine pale yellow. Keep all follow-up visits. This is important. Contact a health care provider   if: Your symptoms do not get better after 1-2 days. Your symptoms go away and then return. Get help right away if: You have severe pain in your  back or your lower abdomen. You have a fever or chills. You have nausea or vomiting. Summary A urinary tract infection (UTI) is an infection of any part of the urinary tract, which includes the kidneys, ureters, bladder, and urethra. Most urinary tract infections are caused by bacteria in your genital area. Treatment for this condition often includes antibiotic medicines. If you were prescribed an antibiotic medicine, take it as told by your health care provider. Do not stop using the antibiotic even if you start to feel better. Keep all follow-up visits. This is important. This information is not intended to replace advice given to you by your health care provider. Make sure you discuss any questions you have with your health care provider. Document Revised: 08/15/2019 Document Reviewed: 08/15/2019 Elsevier Patient Education  2022 [employer] Inc.  

## 2020-12-16 LAB — URINE CULTURE

## 2020-12-20 ENCOUNTER — Telehealth: Payer: Self-pay | Admitting: Family Medicine

## 2020-12-20 MED ORDER — AMOXICILLIN-POT CLAVULANATE 875-125 MG PO TABS: 1.0000 | ORAL_TABLET | Freq: Two times a day (BID) | ORAL | 0 refills | Status: DC

## 2020-12-20 NOTE — Telephone Encounter (Signed)
Patient aware and verbalized understanding. °

## 2020-12-20 NOTE — Telephone Encounter (Signed)
Pt recently saw Evelina Dun for UTI and was prescribed Keflex to take for a week. Pt says she is almost finished with her medicine and says she feels some better but not 100% better. Wants to know if a refill of antibiotic can be called in for her.  Please advise and call patient.

## 2020-12-20 NOTE — Telephone Encounter (Signed)
Pt returned missed call. Says she is still feeling urgency to go to the bathroom a lot, says urine is still cloudy, and still burning some.

## 2020-12-20 NOTE — Telephone Encounter (Signed)
Left message for pt to return call.   What symptoms is the pt still having?

## 2020-12-20 NOTE — Telephone Encounter (Signed)
Pt calling to check on Keflex and to let us know that she would like this refill sent to the [facility] in [facility]

## 2020-12-20 NOTE — Telephone Encounter (Signed)
Augmentin Prescription sent to pharmacy

## 2021-01-05 ENCOUNTER — Other Ambulatory Visit: Payer: Self-pay | Admitting: Family Medicine

## 2021-01-05 DIAGNOSIS — K219 Gastro-esophageal reflux disease without esophagitis: Secondary | ICD-10-CM

## 2021-02-08 ENCOUNTER — Encounter: Payer: Self-pay | Admitting: Family Medicine

## 2021-02-08 ENCOUNTER — Ambulatory Visit (INDEPENDENT_AMBULATORY_CARE_PROVIDER_SITE_OTHER): Payer: Medicare Other | Admitting: Family Medicine

## 2021-02-08 VITALS — BP 131/78 | HR 67 | Temp 97.7°F | Ht 59.0 in | Wt 158.0 lb

## 2021-02-08 DIAGNOSIS — E782 Mixed hyperlipidemia: Secondary | ICD-10-CM

## 2021-02-08 DIAGNOSIS — M797 Fibromyalgia: Secondary | ICD-10-CM

## 2021-02-08 DIAGNOSIS — K219 Gastro-esophageal reflux disease without esophagitis: Secondary | ICD-10-CM | POA: Diagnosis not present

## 2021-02-08 DIAGNOSIS — G9332 Myalgic encephalomyelitis/chronic fatigue syndrome: Secondary | ICD-10-CM | POA: Diagnosis not present

## 2021-02-08 DIAGNOSIS — J302 Other seasonal allergic rhinitis: Secondary | ICD-10-CM | POA: Diagnosis not present

## 2021-02-08 DIAGNOSIS — Z1159 Encounter for screening for other viral diseases: Secondary | ICD-10-CM | POA: Diagnosis not present

## 2021-02-08 DIAGNOSIS — E538 Deficiency of other specified B group vitamins: Secondary | ICD-10-CM

## 2021-02-08 DIAGNOSIS — R82998 Other abnormal findings in urine: Secondary | ICD-10-CM

## 2021-02-08 DIAGNOSIS — I10 Essential (primary) hypertension: Secondary | ICD-10-CM

## 2021-02-08 DIAGNOSIS — F419 Anxiety disorder, unspecified: Secondary | ICD-10-CM

## 2021-02-08 LAB — URINALYSIS, ROUTINE W REFLEX MICROSCOPIC
Bilirubin, UA: NEGATIVE
Glucose, UA: NEGATIVE
Ketones, UA: NEGATIVE
Nitrite, UA: NEGATIVE
Protein,UA: NEGATIVE
RBC, UA: NEGATIVE
Specific Gravity, UA: 1.02 (ref 1.005–1.030)
Urobilinogen, Ur: 0.2 mg/dL (ref 0.2–1.0)
pH, UA: 6 (ref 5.0–7.5)

## 2021-02-08 LAB — MICROSCOPIC EXAMINATION: Renal Epithel, UA: NONE SEEN /hpf

## 2021-02-08 MED ORDER — GABAPENTIN 300 MG PO CAPS: 300.0000 mg | ORAL_CAPSULE | Freq: Three times a day (TID) | ORAL | 1 refills | Status: DC

## 2021-02-08 MED ORDER — METOPROLOL SUCCINATE ER 25 MG PO TB24: 12.5000 mg | ORAL_TABLET | Freq: Every day | ORAL | 1 refills | Status: DC

## 2021-02-08 MED ORDER — GABAPENTIN 300 MG PO CAPS: 300.0000 mg | ORAL_CAPSULE | Freq: Every day | ORAL | 1 refills | Status: DC

## 2021-02-08 MED ORDER — ATORVASTATIN CALCIUM 40 MG PO TABS: 40.0000 mg | ORAL_TABLET | Freq: Every day | ORAL | 1 refills | Status: DC

## 2021-02-08 NOTE — Progress Notes (Signed)
Assessment & Plan:  1. Essential hypertension - Well controlled on current regimen - metoprolol succinate (TOPROL-XL) 25 MG 24 hr tablet; Take 0.5 tablets (12.5 mg total) by mouth daily.  Dispense: 45 tablet; Refill: 1 - CBC with Differential/Platelet - CMP14+EGFR - Lipid panel  2. Mixed hyperlipidemia - Well controlled on current regimen - atorvastatin (LIPITOR) 40 MG tablet; Take 1 tablet (40 mg total) by mouth at bedtime.  Dispense: 90 tablet; Refill: 1 - CMP14+EGFR - Lipid panel  3. Seasonal allergies - Well controlled on current regimen  4. Gastroesophageal reflux disease without esophagitis - Well controlled on current regimen with omeprazole - CMP14+EGFR  5. Fibromyalgia - poorly controlled, will increase gabapentin to 300 mg TID. Patient advised to increase to BID x1 week before transitioning to TID.  6. Anxiety - Well controlled, not currently on medication to treat - CMP14+EGFR  7. B12 deficiency - labs to assess, not currently on supplementation - CBC with Differential/Platelet - Vitamin B12  8. Dark urine - Urinalysis, Routine w reflex microscopic - Urine Culture - If liver enzymes remain elevated/are higher and urine culture is normal, I will order a RUQ ultrasound to assess her liver.  9. Chronic fatigue syndrome - labs to assess other causes in addition to fibromyalgia - CBC with Differential/Platelet - Vitamin B12 - VITAMIN D 25 Hydroxy (Vit-D Deficiency, Fractures) - TSH - CMP14+EGFR  10. Encounter for hepatitis C screening test for low risk patient - Hepatitis C antibody (reflex, frozen specimen)   Return in about 6 months (around 08/08/2021) for annual physical.  Lucile Crater, NP Student  I personally was present during the history, physical exam, and medical decision-making activities of this service and have verified that the service and findings are accurately documented in the nurse practitioner student's note.  [name], MSN,  APRN, FNP-C [facility]  Subjective:    Patient ID: Nicole Aguirre, female    DOB: May 07, 1950, 71 y.o.   MRN: [id number]  Patient Care Team: [name], FNP as PCP - General (Family Medicine)   Chief Complaint:  Chief Complaint  Patient presents with   Medical Management of Chronic Issues   Flank Pain    Right side pain x 2 weeks    dark urine     X 2 weeks    Fatigue    Patient states this has been ongoing.     HPI: Nicole Aguirre is a 71 y.o. female presenting on 02/08/2021 for Medical Management of Chronic Issues, Flank Pain (Right side pain x 2 weeks ), dark urine  (X 2 weeks ), and Fatigue (Patient states this has been ongoing. )   Hypertension: patient's amlodipine was increased to 10 mg daily at her last visit, but she states she . She does check her BP at home. She has 9 readings: 1/9 <120, 2/9 <140, 5/9 140-150, 1/9 >150.   Elevated liver enzymes: patient previously advised to stop taking Tylenol and we would recheck labs at this visit.   Fibromyalgia: she states she has been seeing pain management for this but they have not offered a medication other than gabapentin, only exercise and lifestyle changes. She reports frustration that she is tired all the time and in pain all of the time.   Health maintenance: she is agreeable to hepatitis c screening today and she will schedule her mammogram today since she is due in March.   New complaints: She reports right side flank pain x 2 weeks and dark  urine. She states she is experiencing frequency but not burning and has been staying hydrated with water.    Social history:  Relevant past medical, surgical, family and social history reviewed and updated as indicated. Interim medical history since our last visit reviewed.  Allergies and medications reviewed and updated.  DATA REVIEWED: CHART IN EPIC  ROS: Negative unless specifically indicated above in HPI.    Current Outpatient  Medications:    amLODipine (NORVASC) 10 MG tablet, Take 1 tablet (10 mg total) by mouth daily., Disp: 90 tablet, Rfl: 1   cetirizine (ZYRTEC) 10 MG tablet, Take 10 mg by mouth daily., Disp: , Rfl:    diclofenac Sodium (VOLTAREN) 1 % GEL, Apply 2 g topically 4 (four) times daily., Disp: 150 g, Rfl: 3   fluticasone (FLONASE) 50 MCG/ACT nasal spray, USE 2 SPRAYS IN BOTH  NOSTRILS DAILY, Disp: 48 g, Rfl: 3   hyoscyamine (LEVSIN) 0.125 MG tablet, Take 1 tablet (0.125 mg total) by mouth every 4 (four) hours as needed for cramping., Disp: 30 tablet, Rfl: 0   Magnesium 200 MG TABS, Take 200 mg by mouth daily., Disp: , Rfl:    meloxicam (MOBIC) 15 MG tablet, Take 1 tablet (15 mg total) by mouth every morning., Disp: 90 tablet, Rfl: 1   montelukast (SINGULAIR) 10 MG tablet, TAKE 1 TABLET BY MOUTH  DAILY, Disp: 90 tablet, Rfl: 2   omeprazole (PRILOSEC) 40 MG capsule, TAKE 1 CAPSULE BY MOUTH  DAILY, Disp: 90 capsule, Rfl: 0   atorvastatin (LIPITOR) 40 MG tablet, Take 1 tablet (40 mg total) by mouth at bedtime., Disp: 90 tablet, Rfl: 1   gabapentin (NEURONTIN) 300 MG capsule, Take 1 capsule (300 mg total) by mouth at bedtime., Disp: 90 capsule, Rfl: 1   metoprolol succinate (TOPROL-XL) 25 MG 24 hr tablet, Take 0.5 tablets (12.5 mg total) by mouth daily., Disp: 45 tablet, Rfl: 1   Allergies  Allergen Reactions   Hctz [Hydrochlorothiazide] Other (See Comments)    cramping   Past Medical History:  Diagnosis Date   Allergy    Fibromyalgia    GERD (gastroesophageal reflux disease)    Hyperlipidemia    Hypertension    PVC (premature ventricular contraction)     Past Surgical History:  Procedure Laterality Date   ABDOMINAL HYSTERECTOMY     APPENDECTOMY  1985   CATARACT EXTRACTION, BILATERAL     CHOLECYSTECTOMY  2010    Social History   Socioeconomic History   Marital status: Married    Spouse name: [name]   Number of children: 2   Years of education: 10th   Highest education level: GED or  equivalent  Occupational History   Occupation: Dietary    Comment: Adult nurse   Occupation: retired  Tobacco Use   Smoking status: Former    Packs/day: 1.00    Years: 4.00    Pack years: 4.00    Types: Cigarettes    Quit date: 07/25/1976    Years since quitting: 44.5   Smokeless tobacco: Never  Vaping Use   Vaping Use: Never used  Substance and Sexual Activity   Alcohol use: Never   Drug use: Never   Sexual activity: Yes    Birth control/protection: Surgical  Other Topics Concern   Not on file  Social History Narrative   Not on file   Social Determinants of Health   Financial Resource Strain: Not on file  Food Insecurity: Not on file  Transportation Needs: Not on file  Physical  Activity: Not on file  Stress: Not on file  Social Connections: Not on file  Intimate Partner Violence: Not on file        Objective:    BP 131/78    Pulse 67    Temp 97.7 F (36.5 C) (Temporal)    Ht $R'4\' 11"'FY$  (1.499 m)    Wt 71.7 kg    SpO2 95%    BMI 31.91 kg/m   Wt Readings from Last 3 Encounters:  02/08/21 158 lb (71.7 kg)  12/13/20 159 lb (72.1 kg)  12/07/20 161 lb (73 kg)    Physical Exam Vitals reviewed.  Constitutional:      General: She is not in acute distress.    Appearance: Normal appearance. She is obese. She is not ill-appearing, toxic-appearing or diaphoretic.  HENT:     Head: Normocephalic and atraumatic.  Eyes:     General: No scleral icterus.       Right eye: No discharge.        Left eye: No discharge.     Conjunctiva/sclera: Conjunctivae normal.  Cardiovascular:     Rate and Rhythm: Normal rate and regular rhythm.     Heart sounds: Normal heart sounds. No murmur heard.   No friction rub. No gallop.  Pulmonary:     Effort: Pulmonary effort is normal. No respiratory distress.     Breath sounds: Normal breath sounds. No stridor. No wheezing, rhonchi or rales.  Musculoskeletal:        General: Tenderness (right lower abdomen to right flank and lower back)  present. Normal range of motion.     Cervical back: Normal range of motion.  Skin:    General: Skin is warm and dry.     Capillary Refill: Capillary refill takes less than 2 seconds.  Neurological:     General: No focal deficit present.     Mental Status: She is alert and oriented to person, place, and time. Mental status is at baseline.  Psychiatric:        Mood and Affect: Mood normal.        Behavior: Behavior normal.        Thought Content: Thought content normal.        Judgment: Judgment normal.    Lab Results  Component Value Date   TSH 1.860 07/26/2018   Lab Results  Component Value Date   WBC 4.7 06/30/2020   HGB 14.3 06/30/2020   HCT 41.8 06/30/2020   MCV 92 06/30/2020   PLT 246 06/30/2020   Lab Results  Component Value Date   NA 138 12/07/2020   K 4.3 12/07/2020   CO2 24 12/07/2020   GLUCOSE 84 12/07/2020   BUN 13 12/07/2020   CREATININE 0.81 12/07/2020   BILITOT 0.4 12/07/2020   ALKPHOS 184 (H) 12/07/2020   AST 41 (H) 12/07/2020   ALT 44 (H) 12/07/2020   PROT 6.8 12/07/2020   ALBUMIN 4.1 12/07/2020   CALCIUM 9.5 12/07/2020   EGFR 79 12/07/2020   Lab Results  Component Value Date   CHOL 208 (H) 11/08/2020   Lab Results  Component Value Date   HDL 81 11/08/2020   Lab Results  Component Value Date   LDLCALC 110 (H) 11/08/2020   Lab Results  Component Value Date   TRIG 100 11/08/2020   Lab Results  Component Value Date   CHOLHDL 2.6 11/08/2020   No results found for: HGBA1C

## 2021-02-09 ENCOUNTER — Encounter: Payer: Self-pay | Admitting: Family Medicine

## 2021-02-09 ENCOUNTER — Other Ambulatory Visit: Payer: Self-pay | Admitting: Family Medicine

## 2021-02-09 DIAGNOSIS — E559 Vitamin D deficiency, unspecified: Secondary | ICD-10-CM

## 2021-02-09 LAB — CBC WITH DIFFERENTIAL/PLATELET
Basophils Absolute: 0 10*3/uL (ref 0.0–0.2)
Basos: 1 %
EOS (ABSOLUTE): 0.2 10*3/uL (ref 0.0–0.4)
Eos: 4 %
Hematocrit: 42.8 % (ref 34.0–46.6)
Hemoglobin: 14 g/dL (ref 11.1–15.9)
Immature Grans (Abs): 0 10*3/uL (ref 0.0–0.1)
Immature Granulocytes: 0 %
Lymphocytes Absolute: 1.6 10*3/uL (ref 0.7–3.1)
Lymphs: 43 %
MCH: 29.9 pg (ref 26.6–33.0)
MCHC: 32.7 g/dL (ref 31.5–35.7)
MCV: 92 fL (ref 79–97)
Monocytes Absolute: 0.3 10*3/uL (ref 0.1–0.9)
Monocytes: 7 %
Neutrophils Absolute: 1.7 10*3/uL (ref 1.4–7.0)
Neutrophils: 45 %
Platelets: 230 10*3/uL (ref 150–450)
RBC: 4.68 x10E6/uL (ref 3.77–5.28)
RDW: 12.8 % (ref 11.7–15.4)
WBC: 3.8 10*3/uL (ref 3.4–10.8)

## 2021-02-09 LAB — CMP14+EGFR
ALT: 16 IU/L (ref 0–32)
AST: 16 IU/L (ref 0–40)
Albumin/Globulin Ratio: 1.6 (ref 1.2–2.2)
Albumin: 4.4 g/dL (ref 3.8–4.8)
Alkaline Phosphatase: 62 IU/L (ref 44–121)
BUN/Creatinine Ratio: 18 (ref 12–28)
BUN: 15 mg/dL (ref 8–27)
Bilirubin Total: 0.4 mg/dL (ref 0.0–1.2)
CO2: 22 mmol/L (ref 20–29)
Calcium: 9.5 mg/dL (ref 8.7–10.3)
Chloride: 106 mmol/L (ref 96–106)
Creatinine, Ser: 0.83 mg/dL (ref 0.57–1.00)
Globulin, Total: 2.8 g/dL (ref 1.5–4.5)
Glucose: 87 mg/dL (ref 70–99)
Potassium: 4.4 mmol/L (ref 3.5–5.2)
Sodium: 142 mmol/L (ref 134–144)
Total Protein: 7.2 g/dL (ref 6.0–8.5)
eGFR: 76 mL/min/{1.73_m2} (ref >59)

## 2021-02-09 LAB — HCV INTERPRETATION

## 2021-02-09 LAB — URINE CULTURE

## 2021-02-09 LAB — LIPID PANEL
Chol/HDL Ratio: 2.6 ratio (ref 0.0–4.4)
Cholesterol, Total: 180 mg/dL (ref 100–199)
HDL: 68 mg/dL (ref >39)
LDL Chol Calc (NIH): 95 mg/dL (ref 0–99)
Triglycerides: 96 mg/dL (ref 0–149)
VLDL Cholesterol Cal: 17 mg/dL (ref 5–40)

## 2021-02-09 LAB — VITAMIN D 25 HYDROXY (VIT D DEFICIENCY, FRACTURES): Vit D, 25-Hydroxy: 17.2 ng/mL — ABNORMAL LOW (ref 30.0–100.0)

## 2021-02-09 LAB — HCV AB W REFLEX TO QUANT PCR: HCV Ab: <0.1 s/co ratio (ref 0.0–0.9)

## 2021-02-09 LAB — VITAMIN B12: Vitamin B-12: 211 pg/mL — ABNORMAL LOW (ref 232–1245)

## 2021-02-09 LAB — TSH: TSH: 1.61 u[IU]/mL (ref 0.450–4.500)

## 2021-02-09 MED ORDER — VITAMIN D (ERGOCALCIFEROL) 1.25 MG (50000 UNIT) PO CAPS: 50000.0000 [IU] | ORAL_CAPSULE | ORAL | 0 refills | Status: DC

## 2021-02-11 ENCOUNTER — Other Ambulatory Visit: Payer: Self-pay | Admitting: Family Medicine

## 2021-02-11 DIAGNOSIS — Z1231 Encounter for screening mammogram for malignant neoplasm of breast: Secondary | ICD-10-CM

## 2021-02-24 DIAGNOSIS — Z23 Encounter for immunization: Secondary | ICD-10-CM | POA: Diagnosis not present

## 2021-02-28 ENCOUNTER — Other Ambulatory Visit: Payer: Self-pay | Admitting: Family Medicine

## 2021-02-28 DIAGNOSIS — T7840XS Allergy, unspecified, sequela: Secondary | ICD-10-CM

## 2021-03-01 ENCOUNTER — Other Ambulatory Visit: Payer: Self-pay | Admitting: Family Medicine

## 2021-03-01 DIAGNOSIS — E782 Mixed hyperlipidemia: Secondary | ICD-10-CM

## 2021-03-07 ENCOUNTER — Other Ambulatory Visit: Payer: Self-pay | Admitting: Family Medicine

## 2021-03-07 DIAGNOSIS — K589 Irritable bowel syndrome without diarrhea: Secondary | ICD-10-CM

## 2021-03-07 NOTE — Telephone Encounter (Signed)
Last office visit 02/08/21 Last prescribed 09/09/19, #30, no refills

## 2021-03-08 ENCOUNTER — Ambulatory Visit: Payer: Medicare Other | Admitting: Family Medicine

## 2021-03-09 ENCOUNTER — Ambulatory Visit: Payer: Medicare Other | Admitting: Family Medicine

## 2021-03-14 ENCOUNTER — Telehealth: Payer: Self-pay | Admitting: Family Medicine

## 2021-03-14 ENCOUNTER — Ambulatory Visit (INDEPENDENT_AMBULATORY_CARE_PROVIDER_SITE_OTHER): Payer: Medicare Other

## 2021-03-14 VITALS — Ht 59.0 in | Wt 158.0 lb

## 2021-03-14 DIAGNOSIS — Z0000 Encounter for general adult medical examination without abnormal findings: Secondary | ICD-10-CM

## 2021-03-14 DIAGNOSIS — J309 Allergic rhinitis, unspecified: Secondary | ICD-10-CM | POA: Insufficient documentation

## 2021-03-14 DIAGNOSIS — J329 Chronic sinusitis, unspecified: Secondary | ICD-10-CM | POA: Insufficient documentation

## 2021-03-14 DIAGNOSIS — R079 Chest pain, unspecified: Secondary | ICD-10-CM | POA: Insufficient documentation

## 2021-03-14 NOTE — Telephone Encounter (Signed)
No. In my last note from January I said f/u in 6 months.

## 2021-03-14 NOTE — Patient Instructions (Signed)
Nicole Aguirre , Thank you for taking time to come for your Medicare Wellness Visit. I appreciate your ongoing commitment to your health goals. Please review the following plan we discussed and let me know if I can assist you in the future.   Screening recommendations/referrals: Colonoscopy: Done 04/29/2020 Repeat in 5 years  Mammogram: Scheduled for 04/20/2021. Bone Density: Done 02/17/2020 Repeat every 2 years  Recommended yearly ophthalmology/optometry visit for glaucoma screening and checkup Recommended yearly dental visit for hygiene and checkup  Vaccinations: Influenza vaccine: Done 11/08/2020 Pneumococcal vaccine: Done 02/13/2017 and 02/17/2020. Tdap vaccine: Done 11/22/2010 Repeat in 10 years  Shingles vaccine: Can obtain a your pharmacy.   Covid-19:Done 01/21/2019, 02/18/2019, 11/20/2019, 08/05/2020 and 02/24/2021.  Advanced directives: Advance directive discussed with you today. Even though you declined this today, please call our office should you change your mind, and we can give you the proper paperwork for you to fill out.   Conditions/risks identified: Aim for 30 minutes of exercise or brisk walking, 6-8 glasses of water, and 5 servings of fruits and vegetables each day.   Next appointment: Follow up in one year for your annual wellness visit 2024.   Preventive Care 64 Years and Older, Female Preventive care refers to lifestyle choices and visits with your health care provider that can promote health and wellness. What does preventive care include? A yearly physical exam. This is also called an annual well check. Dental exams once or twice a year. Routine eye exams. Ask your health care provider how often you should have your eyes checked. Personal lifestyle choices, including: Daily care of your teeth and gums. Regular physical activity. Eating a healthy diet. Avoiding tobacco and drug use. Limiting alcohol use. Practicing safe sex. Taking low-dose aspirin every day. Taking  vitamin and mineral supplements as recommended by your health care provider. What happens during an annual well check? The services and screenings done by your health care provider during your annual well check will depend on your age, overall health, lifestyle risk factors, and family history of disease. Counseling  Your health care provider may ask you questions about your: Alcohol use. Tobacco use. Drug use. Emotional well-being. Home and relationship well-being. Sexual activity. Eating habits. History of falls. Memory and ability to understand (cognition). Work and work Statistician. Reproductive health. Screening  You may have the following tests or measurements: Height, weight, and BMI. Blood pressure. Lipid and cholesterol levels. These may be checked every 5 years, or more frequently if you are over 56 years old. Skin check. Lung cancer screening. You may have this screening every year starting at age 38 if you have a 30-pack-year history of smoking and currently smoke or have quit within the past 15 years. Fecal occult blood test (FOBT) of the stool. You may have this test every year starting at age 45. Flexible sigmoidoscopy or colonoscopy. You may have a sigmoidoscopy every 5 years or a colonoscopy every 10 years starting at age 74. Hepatitis C blood test. Hepatitis B blood test. Sexually transmitted disease (STD) testing. Diabetes screening. This is done by checking your blood sugar (glucose) after you have not eaten for a while (fasting). You may have this done every 1-3 years. Bone density scan. This is done to screen for osteoporosis. You may have this done starting at age 53. Mammogram. This may be done every 1-2 years. Talk to your health care provider about how often you should have regular mammograms. Talk with your health care provider about your test results,  treatment options, and if necessary, the need for more tests. Vaccines  Your health care provider may  recommend certain vaccines, such as: Influenza vaccine. This is recommended every year. Tetanus, diphtheria, and acellular pertussis (Tdap, Td) vaccine. You may need a Td booster every 10 years. Zoster vaccine. You may need this after age 32. Pneumococcal 13-valent conjugate (PCV13) vaccine. One dose is recommended after age 19. Pneumococcal polysaccharide (PPSV23) vaccine. One dose is recommended after age 73. Talk to your health care provider about which screenings and vaccines you need and how often you need them. This information is not intended to replace advice given to you by your health care provider. Make sure you discuss any questions you have with your health care provider. Document Released: 01/29/2015 Document Revised: 09/22/2015 Document Reviewed: 11/03/2014 Elsevier Interactive Patient Education  2017 [employer] Prevention in the Home Falls can cause injuries. They can happen to people of all ages. There are many things you can do to make your home safe and to help prevent falls. What can I do on the outside of my home? Regularly fix the edges of walkways and driveways and fix any cracks. Remove anything that might make you trip as you walk through a door, such as a raised step or threshold. Trim any bushes or trees on the path to your home. Use bright outdoor lighting. Clear any walking paths of anything that might make someone trip, such as rocks or tools. Regularly check to see if handrails are loose or broken. Make sure that both sides of any steps have handrails. Any raised decks and porches should have guardrails on the edges. Have any leaves, snow, or ice cleared regularly. Use sand or salt on walking paths during winter. Clean up any spills in your garage right away. This includes oil or grease spills. What can I do in the bathroom? Use night lights. Install grab bars by the toilet and in the tub and shower. Do not use towel bars as grab bars. Use non-skid  mats or decals in the tub or shower. If you need to sit down in the shower, use a plastic, non-slip stool. Keep the floor dry. Clean up any water that spills on the floor as soon as it happens. Remove soap buildup in the tub or shower regularly. Attach bath mats securely with double-sided non-slip rug tape. Do not have throw rugs and other things on the floor that can make you trip. What can I do in the bedroom? Use night lights. Make sure that you have a light by your bed that is easy to reach. Do not use any sheets or blankets that are too big for your bed. They should not hang down onto the floor. Have a firm chair that has side arms. You can use this for support while you get dressed. Do not have throw rugs and other things on the floor that can make you trip. What can I do in the kitchen? Clean up any spills right away. Avoid walking on wet floors. Keep items that you use a lot in easy-to-reach places. If you need to reach something above you, use a strong step stool that has a grab bar. Keep electrical cords out of the way. Do not use floor polish or wax that makes floors slippery. If you must use wax, use non-skid floor wax. Do not have throw rugs and other things on the floor that can make you trip. What can I do with my stairs? Do  not leave any items on the stairs. Make sure that there are handrails on both sides of the stairs and use them. Fix handrails that are broken or loose. Make sure that handrails are as long as the stairways. Check any carpeting to make sure that it is firmly attached to the stairs. Fix any carpet that is loose or worn. Avoid having throw rugs at the top or bottom of the stairs. If you do have throw rugs, attach them to the floor with carpet tape. Make sure that you have a light switch at the top of the stairs and the bottom of the stairs. If you do not have them, ask someone to add them for you. What else can I do to help prevent falls? Wear shoes  that: Do not have high heels. Have rubber bottoms. Are comfortable and fit you well. Are closed at the toe. Do not wear sandals. If you use a stepladder: Make sure that it is fully opened. Do not climb a closed stepladder. Make sure that both sides of the stepladder are locked into place. Ask someone to hold it for you, if possible. Clearly mark and make sure that you can see: Any grab bars or handrails. First and last steps. Where the edge of each step is. Use tools that help you move around (mobility aids) if they are needed. These include: Canes. Walkers. Scooters. Crutches. Turn on the lights when you go into a dark area. Replace any light bulbs as soon as they burn out. Set up your furniture so you have a clear path. Avoid moving your furniture around. If any of your floors are uneven, fix them. If there are any pets around you, be aware of where they are. Review your medicines with your doctor. Some medicines can make you feel dizzy. This can increase your chance of falling. Ask your doctor what other things that you can do to help prevent falls. This information is not intended to replace advice given to you by your health care provider. Make sure you discuss any questions you have with your health care provider. Document Released: 10/29/2008 Document Revised: 06/10/2015 Document Reviewed: 02/06/2014 Elsevier Interactive Patient Education  2017 [employer].

## 2021-03-14 NOTE — Progress Notes (Signed)
Subjective:   Nicole Aguirre is a 71 y.o. female who presents for Medicare Annual (Subsequent) preventive examination. Virtual Visit via Telephone Note  I connected with  [name] on 03/14/21 at  9:45 AM EST by telephone and verified that I am speaking with the correct person using two identifiers.  Location: Patient: HOME Provider: [facility] Persons participating in the virtual visit: patient/Nurse Health Advisor   I discussed the limitations, risks, security and privacy concerns of performing an evaluation and management service by telephone and the availability of in person appointments. The patient expressed understanding and agreed to proceed.  Interactive audio and video telecommunications were attempted between this nurse and patient, however failed, due to patient having technical difficulties OR patient did not have access to video capability.  We continued and completed visit with audio only.  Some vital signs may be absent or patient reported.   Chriss Driver, LPN  Review of Systems     Cardiac Risk Factors include: advanced age (>67men, >74 women);hypertension;dyslipidemia;sedentary lifestyle;obesity (BMI >30kg/m2)     Objective:    Today's Vitals   03/14/21 0948  Weight: 158 lb (71.7 kg)  Height: 4\' 11"  (1.499 m)   Body mass index is 31.91 kg/m.  Advanced Directives 03/14/2021 03/11/2020 07/26/2018  Does Patient Have a Medical Advance Directive? No No No  Would patient like information on creating a medical advance directive? No - Patient declined No - Patient declined No - Patient declined    Current Medications (verified) Outpatient Encounter Medications as of 03/14/2021  Medication Sig   amLODipine (NORVASC) 10 MG tablet Take 1 tablet (10 mg total) by mouth daily.   atorvastatin (LIPITOR) 40 MG tablet TAKE 1 TABLET BY MOUTH AT  BEDTIME   cetirizine (ZYRTEC) 10 MG tablet Take 10 mg by mouth daily.   diclofenac Sodium (VOLTAREN) 1 % GEL Apply  2 g topically 4 (four) times daily.   fluticasone (FLONASE) 50 MCG/ACT nasal spray 1 spray in each nostril   gabapentin (NEURONTIN) 300 MG capsule Take 1 capsule (300 mg total) by mouth 3 (three) times daily.   hyoscyamine (LEVSIN) 0.125 MG tablet Take 1 tablet (0.125 mg total) by mouth every 4 (four) hours as needed for cramping.   Magnesium 200 MG TABS Take 200 mg by mouth daily.   metoprolol succinate (TOPROL-XL) 25 MG 24 hr tablet Take 0.5 tablets (12.5 mg total) by mouth daily.   montelukast (SINGULAIR) 10 MG tablet TAKE 1 TABLET BY MOUTH  DAILY   omeprazole (PRILOSEC) 40 MG capsule TAKE 1 CAPSULE BY MOUTH  DAILY   Vitamin D, Ergocalciferol, (DRISDOL) 1.25 MG (50000 UNIT) CAPS capsule Take 1 capsule (50,000 Units total) by mouth every 7 (seven) days.   [DISCONTINUED] fluticasone (FLONASE) 50 MCG/ACT nasal spray USE 2 SPRAYS IN BOTH  NOSTRILS DAILY   [DISCONTINUED] meloxicam (MOBIC) 15 MG tablet Take 1 tablet (15 mg total) by mouth every morning.   No facility-administered encounter medications on file as of 03/14/2021.    Allergies (verified) Hctz [hydrochlorothiazide]   History: Past Medical History:  Diagnosis Date   Allergy    Fibromyalgia    GERD (gastroesophageal reflux disease)    Hyperlipidemia    Hypertension    PVC (premature ventricular contraction)    Past Surgical History:  Procedure Laterality Date   ABDOMINAL HYSTERECTOMY     APPENDECTOMY  1985   CATARACT EXTRACTION, BILATERAL     CHOLECYSTECTOMY  2010   Family History  Problem Relation Age of  Onset   Lupus Mother    Rheum arthritis Mother    Lung cancer Mother    Emphysema Father    Pancreatic cancer Father    Lupus Sister    Heart disease Brother    Heart disease Son    Lupus Sister    Lung disease Sister    Early death Brother        Died from falling from a tree   Mental illness Son    Social History   Socioeconomic History   Marital status: Married    Spouse name: [name]   Number of  children: 2   Years of education: 10th   Highest education level: GED or equivalent  Occupational History   Occupation: Dietary    Comment: Adult nurse   Occupation: retired  Tobacco Use   Smoking status: Former    Packs/day: 1.00    Years: 4.00    Pack years: 4.00    Types: Cigarettes    Quit date: 07/25/1976    Years since quitting: 44.6   Smokeless tobacco: Never  Vaping Use   Vaping Use: Never used  Substance and Sexual Activity   Alcohol use: Never   Drug use: Never   Sexual activity: Yes    Birth control/protection: Surgical  Other Topics Concern   Not on file  Social History Narrative   2 sons   5 grandchildren.   Social Determinants of Health   Financial Resource Strain: Low Risk    Difficulty of Paying Living Expenses: Not hard at all  Food Insecurity: No Food Insecurity   Worried About Charity fundraiser in the Last Year: Never true   [place] in the Last Year: Never true  Transportation Needs: No Transportation Needs   Lack of Transportation (Medical): No   Lack of Transportation (Non-Medical): No  Physical Activity: Insufficiently Active   Days of Exercise per Week: 3 days   Minutes of Exercise per Session: 30 min  Stress: No Stress Concern Present   Feeling of Stress : Not at all  Social Connections: Socially Integrated   Frequency of Communication with Friends and Family: More than three times a week   Frequency of Social Gatherings with Friends and Family: Twice a week   Attends Religious Services: More than 4 times per year   Active Member of [employer] or Organizations: Yes   Attends Music therapist: More than 4 times per year   Marital Status: Married    Tobacco Counseling Counseling given: Not Answered   Clinical Intake:  Pre-visit preparation completed: No  Pain : No/denies pain     BMI - recorded: 31.91 Nutritional Status: BMI > 30  Obese Nutritional Risks: None Diabetes: No  How often do you need to have  someone help you when you read instructions, pamphlets, or other written materials from your doctor or pharmacy?: 1 - Never  Diabetic?No     Information entered by :: mj Rickardo Brinegar, lpn   Activities of Daily Living In your present state of health, do you have any difficulty performing the following activities: 03/14/2021  Hearing? N  Vision? N  Difficulty concentrating or making decisions? N  Walking or climbing stairs? N  Dressing or bathing? N  Doing errands, shopping? N  Preparing Food and eating ? N  Using the Toilet? N  In the past six months, have you accidently leaked urine? N  Do you have problems with loss of bowel control? N  Managing  your Medications? N  Managing your Finances? N  Housekeeping or managing your Housekeeping? N  Some recent data might be hidden    Patient Care Team: [name], FNP as PCP - General (Family Medicine)  Indicate any recent Medical Services you may have received from other than [facility] providers in the past year (date may be approximate).     Assessment:   This is a routine wellness examination for [place].  Hearing/Vision screen Hearing Screening - Comments:: Some hearing issues due to wax buildup Vision Screening - Comments:: Glasses. [facility] [place]. 2022.  Dietary issues and exercise activities discussed: Current Exercise Habits: Home exercise routine, Type of exercise: walking;stretching, Time (Minutes): 30, Frequency (Times/Week): 3, Weekly Exercise (Minutes/Week): 90, Intensity: Mild, Exercise limited by: cardiac condition(s)   Goals Addressed             This Visit's Progress    AWV   On track    03/11/2020 AWV Goal: Exercise for General Health  Patient will verbalize understanding of the benefits of increased physical activity: Exercising regularly is important. It will improve your overall fitness, flexibility, and endurance. Regular exercise also will improve your overall health. It can help you control your  weight, reduce stress, and improve your bone density. Over the next year, patient will increase physical activity as tolerated with a goal of at least 150 minutes of moderate physical activity per week.  You can tell that you are exercising at a moderate intensity if your heart starts beating faster and you start breathing faster but can still hold a conversation. Moderate-intensity exercise ideas include: Walking 1 mile (1.6 km) in about 15 minutes Biking Hiking Golfing Dancing Water aerobics Patient will verbalize understanding of everyday activities that increase physical activity by providing examples like the following: Yard work, such as: Sales promotion account executive Gardening Washing windows or floors Patient will be able to explain general safety guidelines for exercising:  Before you start a new exercise program, talk with your health care provider. Do not exercise so much that you hurt yourself, feel dizzy, or get very short of breath. Wear comfortable clothes and wear shoes with good support. Drink plenty of water while you exercise to prevent dehydration or heat stroke. Work out until your breathing and your heartbeat get faster.      DIET - INCREASE WATER INTAKE   On track    Try to drink 6-8 glasses of water daily.       Depression Screen PHQ 2/9 Scores 03/14/2021 02/08/2021 12/13/2020 12/07/2020 09/14/2020 06/30/2020 03/30/2020  PHQ - 2 Score 0 0 0 0 0 0 0  PHQ- 9 Score - 0 - - 0 0 5    Fall Risk Fall Risk  03/14/2021 02/08/2021 12/13/2020 12/07/2020 09/14/2020  Falls in the past year? 0 0 0 0 0  Number falls in past yr: 0 - - - -  Injury with Fall? 0 - - - -  Risk for fall due to : No Fall Risks - - - -  Follow up Falls prevention discussed - - - -    FALL RISK PREVENTION PERTAINING TO THE HOME:  Any stairs in or around the home? Yes  If so, are there any without handrails? No  Home free of  loose throw rugs in walkways, pet beds, electrical cords, etc? Yes  Adequate lighting in your home to reduce risk of falls? Yes   ASSISTIVE DEVICES  UTILIZED TO PREVENT FALLS:  Life alert? No  Use of a cane, walker or w/c? No  Grab bars in the bathroom? Yes  Shower chair or bench in shower? Yes  Elevated toilet seat or a handicapped toilet? No   TIMED UP AND GO:  Was the test performed? No .  Phone visit.  Cognitive Function:     6CIT Screen 03/11/2020 07/26/2018  What Year? 0 points 0 points  What month? 0 points 0 points  What time? 0 points 0 points  Count back from 20 0 points 0 points  Months in reverse 0 points 2 points  Repeat phrase 0 points 0 points  Total Score 0 2    Immunizations Immunization History  Administered Date(s) Administered   Fluad Quad(high Dose 65+) 10/24/2019, 11/08/2020   Influenza, High Dose Seasonal PF 10/09/2017   Influenza,inj,Quad PF,6+ Mos 11/15/2016   Moderna Covid-19 Vaccine Bivalent Booster 75yrs & up 02/24/2021   Moderna Sars-Covid-2 Vaccination 01/21/2019, 02/18/2019, 11/20/2019, 08/05/2020   Pneumococcal Conjugate-13 02/13/2017, 02/17/2020   Tdap 11/22/2010    TDAP status: Due, Education has been provided regarding the importance of this vaccine. Advised may receive this vaccine at local pharmacy or Health Dept. Aware to provide a copy of the vaccination record if obtained from local pharmacy or Health Dept. Verbalized acceptance and understanding.  Flu Vaccine status: Up to date  Pneumococcal vaccine status: Up to date  Covid-19 vaccine status: Completed vaccines  Qualifies for Shingles Vaccine? Yes   Zostavax completed No   Shingrix Completed?: No.    Education has been provided regarding the importance of this vaccine. Patient has been advised to call insurance company to determine out of pocket expense if they have not yet received this vaccine. Advised may also receive vaccine at local pharmacy or Health Dept. Verbalized  acceptance and understanding.  Screening Tests Health Maintenance  Topic Date Due   Zoster Vaccines- Shingrix (1 of 2) Never done   Pneumonia Vaccine 69+ Years old (2 - PPSV23 if available, else PCV20) 02/16/2021   TETANUS/TDAP  12/07/2021 (Originally 11/21/2020)   MAMMOGRAM  04/14/2021   DEXA SCAN  02/16/2022   COLONOSCOPY (Pts 45-33yrs Insurance coverage will need to be confirmed)  04/29/2025   INFLUENZA VACCINE  Completed   COVID-19 Vaccine  Completed   Hepatitis C Screening  Completed   HPV VACCINES  Aged Out    Health Maintenance  Health Maintenance Due  Topic Date Due   Zoster Vaccines- Shingrix (1 of 2) Never done   Pneumonia Vaccine 90+ Years old (2 - PPSV23 if available, else PCV20) 02/16/2021    Colorectal cancer screening: Type of screening: Colonoscopy. Completed 04/29/2020. Repeat every 5 years  Mammogram status: Completed SCHEDULED FOR 04/20/2021. Repeat every year  Bone Density status: Completed 02/17/2020. Results reflect: Bone density results: NORMAL. Repeat every 2 years.  Lung Cancer Screening: (Low Dose CT Chest recommended if Age 50-80 years, 30 pack-year currently smoking OR have quit w/in 15years.) does not qualify.  Additional Screening:  Hepatitis C Screening: does qualify; Completed 02/08/2021  Vision Screening: Recommended annual ophthalmology exams for early detection of glaucoma and other disorders of the eye. Is the patient up to date with their annual eye exam?  Yes  Who is the provider or what is the name of the office in which the patient attends annual eye exams? [facility] [place] If pt is not established with a provider, would they like to be referred to a provider to establish care? No .  Dental Screening: Recommended annual dental exams for proper oral hygiene  Community Resource Referral / Chronic Care Management: CRR required this visit?  No   CCM required this visit?  No      Plan:     I have personally reviewed and noted the  following in the patients chart:   Medical and social history Use of alcohol, tobacco or illicit drugs  Current medications and supplements including opioid prescriptions.  Functional ability and status Nutritional status Physical activity Advanced directives List of other physicians Hospitalizations, surgeries, and ER visits in previous 12 months Vitals Screenings to include cognitive, depression, and falls Referrals and appointments  In addition, I have reviewed and discussed with patient certain preventive protocols, quality metrics, and best practice recommendations. A written personalized care plan for preventive services as well as general preventive health recommendations were provided to patient.     Chriss Driver, LPN   [id number]   Nurse Notes: Pt is up to date on all health maintenance. Discussed Shingrix and Tdap and how to obtain.

## 2021-03-15 NOTE — Telephone Encounter (Signed)
Patient aware.

## 2021-03-18 ENCOUNTER — Ambulatory Visit: Payer: Medicare Other | Admitting: Family Medicine

## 2021-03-28 ENCOUNTER — Encounter: Payer: Self-pay | Admitting: Family Medicine

## 2021-03-28 ENCOUNTER — Ambulatory Visit (INDEPENDENT_AMBULATORY_CARE_PROVIDER_SITE_OTHER): Payer: Medicare Other | Admitting: Family Medicine

## 2021-03-28 DIAGNOSIS — J302 Other seasonal allergic rhinitis: Secondary | ICD-10-CM | POA: Diagnosis not present

## 2021-03-28 DIAGNOSIS — J329 Chronic sinusitis, unspecified: Secondary | ICD-10-CM

## 2021-03-28 DIAGNOSIS — J40 Bronchitis, not specified as acute or chronic: Secondary | ICD-10-CM

## 2021-03-28 MED ORDER — AMOXICILLIN-POT CLAVULANATE 875-125 MG PO TABS: 1.0000 | ORAL_TABLET | Freq: Two times a day (BID) | ORAL | 0 refills | Status: DC

## 2021-03-28 MED ORDER — PREDNISONE 10 MG PO TABS: ORAL_TABLET | ORAL | 0 refills | Status: DC

## 2021-03-28 NOTE — Progress Notes (Signed)
? ? ?Subjective:  ? ? Patient ID: Nicole Aguirre, female    DOB: Jul 09, 1950, 71 y.o.   MRN: [id number] ? ? ?HPI: ?[name] is a 71 y.o. female presenting for allergy related drainage causing nausea and diarrhea. Feeling bad. Coughing. Some thick sputum. No a lot of color. ONset 4 days ago. Denies fever. Some dyspnea at times.  ? ? ?Depression screen [facility] 2/9 03/14/2021 02/08/2021 12/13/2020 12/07/2020 09/14/2020  ?Decreased Interest 0 0 0 0 0  ?Down, Depressed, Hopeless 0 0 0 0 0  ?PHQ - 2 Score 0 0 0 0 0  ?Altered sleeping - 0 - - 0  ?Tired, decreased energy - 0 - - 0  ?Change in appetite - 0 - - 0  ?Feeling bad or failure about yourself  - 0 - - 0  ?Trouble concentrating - 0 - - 0  ?Moving slowly or fidgety/restless - 0 - - 0  ?Suicidal thoughts - 0 - - 0  ?PHQ-9 Score - 0 - - 0  ?Difficult doing work/chores - Not difficult at all - - Not difficult at all  ? ? ? ?Relevant past medical, surgical, family and social history reviewed and updated as indicated.  ?Interim medical history since our last visit reviewed. ?Allergies and medications reviewed and updated. ? ?ROS:  ?Review of Systems  ?Constitutional:  Positive for chills and fever. Negative for activity change and appetite change.  ?HENT:  Positive for congestion, postnasal drip, rhinorrhea and sinus pressure. Negative for ear pain, nosebleeds, sneezing and trouble swallowing.   ?Respiratory:  Positive for cough and shortness of breath. Negative for chest tightness.   ?Cardiovascular:  Negative for chest pain and palpitations.  ?Skin:  Negative for rash.   ? ?Social History  ? ?Tobacco Use  ?Smoking Status Former  ? Packs/day: 1.00  ? Years: 4.00  ? Pack years: 4.00  ? Types: Cigarettes  ? Quit date: 07/25/1976  ? Years since quitting: 44.7  ?Smokeless Tobacco Never  ? ? ?   ?Objective:  ?  ? ?Wt Readings from Last 3 Encounters:  ?03/14/21 158 lb (71.7 kg)  ?02/08/21 158 lb (71.7 kg)  ?12/13/20 159 lb (72.1 kg)  ?  ? ?Exam deferred. Pt. Harboring due  to COVID 19. Phone visit performed.  ? ?Assessment & Plan:  ? ?1. Sinobronchitis   ?2. Seasonal allergies   ? ? ?Meds ordered this encounter  ?Medications  ? amoxicillin-clavulanate (AUGMENTIN) 875-125 MG tablet  ?  Sig: Take 1 tablet by mouth 2 (two) times daily. Take all of this medication  ?  Dispense:  20 tablet  ?  Refill:  0  ? predniSONE (DELTASONE) 10 MG tablet  ?  Sig: Take 5 daily for 2 days followed by 4,3,2 and 1 for 2 days each.  ?  Dispense:  30 tablet  ?  Refill:  0  ? ? ?No orders of the defined types were placed in this encounter. ? ?  ? ?Diagnoses and all orders for this visit: ? ?Sinobronchitis ? ?Seasonal allergies ? ?Other orders ?-     amoxicillin-clavulanate (AUGMENTIN) 875-125 MG tablet; Take 1 tablet by mouth 2 (two) times daily. Take all of this medication ?-     predniSONE (DELTASONE) 10 MG tablet; Take 5 daily for 2 days followed by 4,3,2 and 1 for 2 days each. ? ? ? ?Virtual Visit via telephone Note ? ?I discussed the limitations, risks, security and privacy concerns of performing an evaluation and management service by telephone  and the availability of in person appointments. The patient was identified with two identifiers. Pt.expressed understanding and agreed to proceed. Pt. Is at home. Dr. Livia Snellen is in his office. ? ?Follow Up Instructions: ?  ?I discussed the assessment and treatment plan with the patient. The patient was provided an opportunity to ask questions and all were answered. The patient agreed with the plan and demonstrated an understanding of the instructions. ?  ?The patient was advised to call back or seek an in-person evaluation if the symptoms worsen or if the condition fails to improve as anticipated. ? ? ?Total minutes including chart review and phone contact time: 7 ? ? ?Follow up plan: ?Return if symptoms worsen or fail to improve. ? ?Claretta Fraise, MD ?[place] ? ?

## 2021-04-06 ENCOUNTER — Other Ambulatory Visit: Payer: Self-pay | Admitting: Family Medicine

## 2021-04-06 ENCOUNTER — Ambulatory Visit (INDEPENDENT_AMBULATORY_CARE_PROVIDER_SITE_OTHER): Payer: Medicare Other | Admitting: Family Medicine

## 2021-04-06 ENCOUNTER — Encounter: Payer: Self-pay | Admitting: Family Medicine

## 2021-04-06 VITALS — BP 143/83 | HR 57 | Temp 97.5°F | Ht 59.0 in | Wt 164.8 lb

## 2021-04-06 DIAGNOSIS — R0602 Shortness of breath: Secondary | ICD-10-CM

## 2021-04-06 DIAGNOSIS — J40 Bronchitis, not specified as acute or chronic: Secondary | ICD-10-CM

## 2021-04-06 DIAGNOSIS — J329 Chronic sinusitis, unspecified: Secondary | ICD-10-CM

## 2021-04-06 MED ORDER — ALBUTEROL SULFATE HFA 108 (90 BASE) MCG/ACT IN AERS: 2.0000 | INHALATION_SPRAY | RESPIRATORY_TRACT | 1 refills | Status: DC | PRN

## 2021-04-06 NOTE — Progress Notes (Signed)
? ?Assessment & Plan:  ?1. Sinobronchitis ?Patient to continue taking medications previously prescribed.  Reassurance provided that her lungs are clear.  Education provided on how to properly use an albuterol inhaler. ? ?2. Shortness of breath ?- albuterol (VENTOLIN HFA) 108 (90 Base) MCG/ACT inhaler; Inhale 2 puffs into the lungs every 4 (four) hours as needed for wheezing or shortness of breath.  Dispense: 18 g; Refill: 1 ? ? ?Follow up plan: Return if symptoms worsen or fail to improve. ? ?[name], MSN, APRN, FNP-C ?[place] ? ?Subjective:  ? ?Patient ID: Nicole Aguirre, female    DOB: [birth year], 71 y.o.   MRN: [id number] ? ?HPI: ?[name] is a 71 y.o. female presenting on 04/06/2021 for Shortness of Breath and nasal draniage (Patient had a visit on 3/13-sinobronchitis and states she is still having nasal drainage and SOB.) ? ?Patient had a visit nine days ago at which time she was diagnosed with sinobronchitis.  She was treated with Augmentin and a prednisone taper.  She reports today overall she feels like she is improving, but her chest feels tight and she gets short of breath. She is unable to take a deep breath. She continues to wheeze and cough.  She has a history of pneumonia and is worried about recurrence. ? ? ?ROS: Negative unless specifically indicated above in HPI.  ? ?Relevant past medical history reviewed and updated as indicated.  ? ?Allergies and medications reviewed and updated. ? ? ?Current Outpatient Medications:  ?  amLODipine (NORVASC) 10 MG tablet, Take 1 tablet (10 mg total) by mouth daily., Disp: 90 tablet, Rfl: 1 ?  amoxicillin-clavulanate (AUGMENTIN) 875-125 MG tablet, Take 1 tablet by mouth 2 (two) times daily. Take all of this medication, Disp: 20 tablet, Rfl: 0 ?  atorvastatin (LIPITOR) 40 MG tablet, TAKE 1 TABLET BY MOUTH AT  BEDTIME, Disp: 90 tablet, Rfl: 3 ?  cetirizine (ZYRTEC) 10 MG tablet, Take 10 mg by mouth daily., Disp: , Rfl:  ?   diclofenac Sodium (VOLTAREN) 1 % GEL, Apply 2 g topically 4 (four) times daily., Disp: 150 g, Rfl: 3 ?  fluticasone (FLONASE) 50 MCG/ACT nasal spray, 1 spray in each nostril, Disp: , Rfl:  ?  gabapentin (NEURONTIN) 300 MG capsule, Take 1 capsule (300 mg total) by mouth 3 (three) times daily., Disp: 270 capsule, Rfl: 1 ?  hyoscyamine (LEVSIN) 0.125 MG tablet, Take 1 tablet (0.125 mg total) by mouth every 4 (four) hours as needed for cramping., Disp: 30 tablet, Rfl: 1 ?  Magnesium 200 MG TABS, Take 200 mg by mouth daily., Disp: , Rfl:  ?  metoprolol succinate (TOPROL-XL) 25 MG 24 hr tablet, Take 0.5 tablets (12.5 mg total) by mouth daily., Disp: 45 tablet, Rfl: 1 ?  montelukast (SINGULAIR) 10 MG tablet, TAKE 1 TABLET BY MOUTH  DAILY, Disp: 90 tablet, Rfl: 2 ?  omeprazole (PRILOSEC) 40 MG capsule, TAKE 1 CAPSULE BY MOUTH  DAILY, Disp: 90 capsule, Rfl: 0 ?  predniSONE (DELTASONE) 10 MG tablet, Take 5 daily for 2 days followed by 4,3,2 and 1 for 2 days each., Disp: 30 tablet, Rfl: 0 ?  Vitamin D, Ergocalciferol, (DRISDOL) 1.25 MG (50000 UNIT) CAPS capsule, Take 1 capsule (50,000 Units total) by mouth every 7 (seven) days., Disp: 12 capsule, Rfl: 0 ? ?Allergies  ?Allergen Reactions  ? Hctz [Hydrochlorothiazide] Other (See Comments)  ?  cramping  ? ? ?Objective:  ? ?BP (!) 143/83   Pulse (!) 57   Temp (!)  97.5 ?F (36.4 ?C) (Temporal)   Ht '4\' 11"'$  (1.499 m)   Wt 164 lb 12.8 oz (74.8 kg)   SpO2 98%   BMI 33.29 kg/m?   ? ?Physical Exam ?Vitals reviewed.  ?Constitutional:   ?   General: She is not in acute distress. ?   Appearance: Normal appearance. She is not ill-appearing, toxic-appearing or diaphoretic.  ?HENT:  ?   Head: Normocephalic and atraumatic.  ?Eyes:  ?   General: No scleral icterus.    ?   Right eye: No discharge.     ?   Left eye: No discharge.  ?   Conjunctiva/sclera: Conjunctivae normal.  ?Cardiovascular:  ?   Rate and Rhythm: Normal rate and regular rhythm.  ?   Heart sounds: Normal heart sounds. No  murmur heard. ?  No friction rub. No gallop.  ?Pulmonary:  ?   Effort: Pulmonary effort is normal. No respiratory distress.  ?   Breath sounds: Normal breath sounds. No stridor. No wheezing, rhonchi or rales.  ?Musculoskeletal:     ?   General: Normal range of motion.  ?   Cervical back: Normal range of motion.  ?Skin: ?   General: Skin is warm and dry.  ?   Capillary Refill: Capillary refill takes less than 2 seconds.  ?Neurological:  ?   General: No focal deficit present.  ?   Mental Status: She is alert and oriented to person, place, and time. Mental status is at baseline.  ?Psychiatric:     ?   Mood and Affect: Mood normal.     ?   Behavior: Behavior normal.     ?   Thought Content: Thought content normal.     ?   Judgment: Judgment normal.  ? ? ? ? ? ? ?

## 2021-04-11 NOTE — Telephone Encounter (Signed)
Would you like to change or start PA? ?

## 2021-04-12 MED ORDER — ALBUTEROL SULFATE HFA 108 (90 BASE) MCG/ACT IN AERS: 2.0000 | INHALATION_SPRAY | Freq: Four times a day (QID) | RESPIRATORY_TRACT | 2 refills | Status: AC | PRN

## 2021-04-12 NOTE — Addendum Note (Signed)
Addended by: Antonietta Barcelona D on: 04/12/2021 08:05 AM ? ? Modules accepted: Orders ? ?

## 2021-04-12 NOTE — Telephone Encounter (Signed)
Refill failed. resent °

## 2021-04-15 ENCOUNTER — Other Ambulatory Visit: Payer: Self-pay | Admitting: Family Medicine

## 2021-04-15 DIAGNOSIS — E559 Vitamin D deficiency, unspecified: Secondary | ICD-10-CM

## 2021-04-18 ENCOUNTER — Telehealth: Payer: Self-pay | Admitting: Family Medicine

## 2021-04-18 DIAGNOSIS — E782 Mixed hyperlipidemia: Secondary | ICD-10-CM | POA: Diagnosis not present

## 2021-04-18 DIAGNOSIS — I493 Ventricular premature depolarization: Secondary | ICD-10-CM | POA: Diagnosis not present

## 2021-04-18 DIAGNOSIS — I10 Essential (primary) hypertension: Secondary | ICD-10-CM | POA: Diagnosis not present

## 2021-04-18 NOTE — Telephone Encounter (Signed)
Pt called stating that she has been battling an URI for almost a month and has been seen at our office twice for this but says nothing is helping her to feel better.  ? ?Needs advise on what to do. ?

## 2021-04-18 NOTE — Telephone Encounter (Signed)
Pt advised to be seen since her symptoms are still present. She wanted appt Wednesday since she has a mammogram appt scheduled. Appt scheduled 04/20/21 at 9:50 with DOD. ?

## 2021-04-20 ENCOUNTER — Ambulatory Visit (INDEPENDENT_AMBULATORY_CARE_PROVIDER_SITE_OTHER): Payer: Medicare Other | Admitting: Nurse Practitioner

## 2021-04-20 ENCOUNTER — Ambulatory Visit
Admission: RE | Admit: 2021-04-20 | Discharge: 2021-04-20 | Disposition: A | Discharge status: Home / Self Care | Payer: Medicare Other | Source: Ambulatory Visit | Attending: Family Medicine | Admitting: Family Medicine

## 2021-04-20 ENCOUNTER — Telehealth: Payer: Self-pay | Admitting: Family Medicine

## 2021-04-20 ENCOUNTER — Encounter: Payer: Self-pay | Admitting: Nurse Practitioner

## 2021-04-20 VITALS — BP 140/88 | HR 72 | Temp 98.7°F | Ht 59.0 in | Wt 163.0 lb

## 2021-04-20 DIAGNOSIS — J349 Unspecified disorder of nose and nasal sinuses: Secondary | ICD-10-CM | POA: Diagnosis not present

## 2021-04-20 DIAGNOSIS — Z1231 Encounter for screening mammogram for malignant neoplasm of breast: Secondary | ICD-10-CM | POA: Diagnosis not present

## 2021-04-20 MED ORDER — PSEUDOEPH-BROMPHEN-DM 30-2-10 MG/5ML PO SYRP: 5.0000 mL | ORAL_SOLUTION | Freq: Four times a day (QID) | ORAL | 0 refills | Status: DC | PRN

## 2021-04-20 NOTE — Patient Instructions (Signed)

## 2021-04-20 NOTE — Telephone Encounter (Signed)
Je called the medication in ?

## 2021-04-20 NOTE — Progress Notes (Signed)
? ?Acute Office Visit ? ?Subjective:  ? ? Patient ID: Nicole Aguirre, female    DOB: March 22, 1950, 71 y.o.   MRN: [id number] ? ?Chief Complaint  ?Patient presents with  ? Sinus Problem  ?  Drainage, sob, cough ? ?X1 month  ? ? ?URI  ?This is a recurrent problem. The current episode started in the past 7 days. The problem has been gradually improving. There has been no fever. Associated symptoms include congestion and coughing. Pertinent negatives include no abdominal pain, headaches, nausea, sinus pain or wheezing.  ? ?Past Medical History:  ?Diagnosis Date  ? Allergy   ? Fibromyalgia   ? GERD (gastroesophageal reflux disease)   ? Hyperlipidemia   ? Hypertension   ? PVC (premature ventricular contraction)   ? ? ?Past Surgical History:  ?Procedure Laterality Date  ? ABDOMINAL HYSTERECTOMY    ? APPENDECTOMY  1985  ? CATARACT EXTRACTION, BILATERAL    ? CHOLECYSTECTOMY  2010  ? ? ?Family History  ?Problem Relation Age of Onset  ? Lupus Mother   ? Rheum arthritis Mother   ? Lung cancer Mother   ? Emphysema Father   ? Pancreatic cancer Father   ? Lupus Sister   ? Lupus Sister   ? Lung disease Sister   ? Heart disease Brother   ? Early death Brother   ?     Died from falling from a tree  ? Heart disease Son   ? Mental illness Son   ? Breast cancer Neg Hx   ? ? ?Social History  ? ?Socioeconomic History  ? Marital status: Married  ?  Spouse name: [name]  ? Number of children: 2  ? Years of education: 10th  ? Highest education level: GED or equivalent  ?Occupational History  ? Occupation: Dietary  ?  Comment: [employer]  ? Occupation: retired  ?Tobacco Use  ? Smoking status: Former  ?  Packs/day: 1.00  ?  Years: 4.00  ?  Pack years: 4.00  ?  Types: Cigarettes  ?  Quit date: 07/25/1976  ?  Years since quitting: 44.7  ? Smokeless tobacco: Never  ?Vaping Use  ? Vaping Use: Never used  ?Substance and Sexual Activity  ? Alcohol use: Never  ? Drug use: Never  ? Sexual activity: Yes  ?  Birth control/protection: Surgical  ?Other  Topics Concern  ? Not on file  ?Social History Narrative  ? 2 sons  ? 5 grandchildren.  ? ?Social Determinants of Health  ? ?Financial Resource Strain: Low Risk   ? Difficulty of Paying Living Expenses: Not hard at all  ?Food Insecurity: No Food Insecurity  ? Worried About Charity fundraiser in the Last Year: Never true  ? Ran Out of Food in the Last Year: Never true  ?Transportation Needs: No Transportation Needs  ? Lack of Transportation (Medical): No  ? Lack of Transportation (Non-Medical): No  ?Physical Activity: Insufficiently Active  ? Days of Exercise per Week: 3 days  ? Minutes of Exercise per Session: 30 min  ?Stress: No Stress Concern Present  ? Feeling of Stress : Not at all  ?Social Connections: Socially Integrated  ? Frequency of Communication with Friends and Family: More than three times a week  ? Frequency of Social Gatherings with Friends and Family: Twice a week  ? Attends Religious Services: More than 4 times per year  ? Active Member of Clubs or Organizations: Yes  ? Attends Archivist Meetings: More than  4 times per year  ? Marital Status: Married  ?Intimate Partner Violence: Not At Risk  ? Fear of Current or Ex-Partner: No  ? Emotionally Abused: No  ? Physically Abused: No  ? Sexually Abused: No  ? ? ?Outpatient Medications Prior to Visit  ?Medication Sig Dispense Refill  ? albuterol (VENTOLIN HFA) 108 (90 Base) MCG/ACT inhaler Inhale 2 puffs into the lungs every 6 (six) hours as needed. 18 g 2  ? amLODipine (NORVASC) 10 MG tablet Take 1 tablet (10 mg total) by mouth daily. 90 tablet 1  ? atorvastatin (LIPITOR) 40 MG tablet TAKE 1 TABLET BY MOUTH AT  BEDTIME 90 tablet 3  ? cetirizine (ZYRTEC) 10 MG tablet Take 10 mg by mouth daily.    ? diclofenac Sodium (VOLTAREN) 1 % GEL APPLY 2 GRAMS TO AFFECTED AREA 4 TIMES A DAY 200 g 3  ? fluticasone (FLONASE) 50 MCG/ACT nasal spray 1 spray in each nostril    ? gabapentin (NEURONTIN) 300 MG capsule Take 1 capsule (300 mg total) by mouth 3  (three) times daily. 270 capsule 1  ? hyoscyamine (LEVSIN) 0.125 MG tablet Take 1 tablet (0.125 mg total) by mouth every 4 (four) hours as needed for cramping. 30 tablet 1  ? lisinopril (ZESTRIL) 5 MG tablet Take by mouth.    ? Magnesium 200 MG TABS Take 200 mg by mouth daily.    ? metoprolol succinate (TOPROL-XL) 25 MG 24 hr tablet Take 0.5 tablets (12.5 mg total) by mouth daily. 45 tablet 1  ? montelukast (SINGULAIR) 10 MG tablet TAKE 1 TABLET BY MOUTH  DAILY 90 tablet 2  ? omeprazole (PRILOSEC) 40 MG capsule TAKE 1 CAPSULE BY MOUTH  DAILY 90 capsule 0  ? Vitamin D, Ergocalciferol, (DRISDOL) 1.25 MG (50000 UNIT) CAPS capsule TAKE 1 CAPSULE (50,000 UNITS TOTAL) BY MOUTH EVERY 7 (SEVEN) DAYS 12 capsule 0  ? amoxicillin-clavulanate (AUGMENTIN) 875-125 MG tablet Take 1 tablet by mouth 2 (two) times daily. Take all of this medication 20 tablet 0  ? predniSONE (DELTASONE) 10 MG tablet Take 5 daily for 2 days followed by 4,3,2 and 1 for 2 days each. 30 tablet 0  ? ?No facility-administered medications prior to visit.  ? ? ?Allergies  ?Allergen Reactions  ? Hctz [Hydrochlorothiazide] Other (See Comments)  ?  cramping  ? ? ?Review of Systems  ?HENT:  Positive for congestion. Negative for sinus pain.   ?Respiratory:  Positive for cough. Negative for wheezing.   ?Gastrointestinal:  Negative for abdominal pain and nausea.  ?Neurological:  Negative for headaches.  ?All other systems reviewed and are negative. ? ?   ?Objective:  ?  ?Physical Exam ?Vitals and nursing note reviewed.  ?Constitutional:   ?   Appearance: Normal appearance.  ?HENT:  ?   Head: Normocephalic.  ?   Nose: Congestion present.  ?Eyes:  ?   Conjunctiva/sclera: Conjunctivae normal.  ?Cardiovascular:  ?   Rate and Rhythm: Normal rate and regular rhythm.  ?   Pulses: Normal pulses.  ?   Heart sounds: Normal heart sounds.  ?Pulmonary:  ?   Effort: Pulmonary effort is normal.  ?   Breath sounds: Normal breath sounds.  ?Abdominal:  ?   General: Bowel sounds are  normal.  ?Musculoskeletal:     ?   General: Normal range of motion.  ?Skin: ?   General: Skin is warm.  ?   Findings: No rash.  ?Neurological:  ?   Mental Status: She is alert  and oriented to person, place, and time.  ?Psychiatric:     ?   Behavior: Behavior normal.  ? ? ?BP 140/88   Pulse 72   Temp 98.7 ?F (37.1 ?C)   Ht _0  (1.499 m)   Wt 163 lb (73.9 kg)   SpO2 97%   BMI 32.92 kg/m?  ?Wt Readings from Last 3 Encounters:  ?04/20/21 163 lb (73.9 kg)  ?04/06/21 164 lb 12.8 oz (74.8 kg)  ?03/14/21 158 lb (71.7 kg)  ? ? ?Health Maintenance Due  ?Topic Date Due  ? Zoster Vaccines- Shingrix (1 of 2) Never done  ? Pneumonia Vaccine 85+ Years old (2 - PPSV23 if available, else PCV20) 02/16/2021  ? MAMMOGRAM  04/14/2021  ? ? ?There are no preventive care reminders to display for this patient. ? ? ?Lab Results  ?Component Value Date  ? TSH 1.610 02/08/2021  ? ?Lab Results  ?Component Value Date  ? WBC 3.8 02/08/2021  ? HGB 14.0 02/08/2021  ? HCT 42.8 02/08/2021  ? MCV 92 02/08/2021  ? PLT 230 02/08/2021  ? ?Lab Results  ?Component Value Date  ? NA 142 02/08/2021  ? K 4.4 02/08/2021  ? CO2 22 02/08/2021  ? GLUCOSE 87 02/08/2021  ? BUN 15 02/08/2021  ? CREATININE 0.83 02/08/2021  ? BILITOT 0.4 02/08/2021  ? ALKPHOS 62 02/08/2021  ? AST 16 02/08/2021  ? ALT 16 02/08/2021  ? PROT 7.2 02/08/2021  ? ALBUMIN 4.4 02/08/2021  ? CALCIUM 9.5 02/08/2021  ? EGFR 76 02/08/2021  ? ?Lab Results  ?Component Value Date  ? CHOL 180 02/08/2021  ? ?Lab Results  ?Component Value Date  ? HDL 68 02/08/2021  ? ?Lab Results  ?Component Value Date  ? [place] 95 02/08/2021  ? ?Lab Results  ?Component Value Date  ? TRIG 96 02/08/2021  ? ?Lab Results  ?Component Value Date  ? CHOLHDL 2.6 02/08/2021  ? ?No results found for: HGBA1C ? ?   ?Assessment & Plan:  ?Take meds as prescribed ?- Use a cool mist humidifier  ?-Use saline nose sprays frequently ?-Force fluids ?-For fever or aches or pains- take Tylenol or ibuprofen. ?-If symptoms do not  improve, she may need to be COVID tested to rule this out ?Follow up with worsening unresolved symptoms  ?Problem List Items Addressed This Visit   ?None ?Visit Diagnoses   ? ? Sinus problem    -  Primary  ? Relevant Med

## 2021-04-21 ENCOUNTER — Telehealth: Payer: Self-pay | Admitting: Family Medicine

## 2021-04-21 DIAGNOSIS — J349 Unspecified disorder of nose and nasal sinuses: Secondary | ICD-10-CM

## 2021-04-21 MED ORDER — PSEUDOEPH-BROMPHEN-DM 30-2-10 MG/5ML PO SYRP: 5.0000 mL | ORAL_SOLUTION | Freq: Four times a day (QID) | ORAL | 0 refills | Status: DC | PRN

## 2021-04-21 NOTE — Telephone Encounter (Signed)
Rx re sent - attempted to contact patient - NA ?

## 2021-04-25 ENCOUNTER — Other Ambulatory Visit: Payer: Self-pay | Admitting: Family Medicine

## 2021-04-25 DIAGNOSIS — T7840XS Allergy, unspecified, sequela: Secondary | ICD-10-CM

## 2021-04-25 NOTE — Telephone Encounter (Signed)
[name] patient  ?Last OV 04/20/21. Last RF historical. Next OV 08/10/21 ? ?

## 2021-05-01 ENCOUNTER — Other Ambulatory Visit: Payer: Self-pay | Admitting: Family Medicine

## 2021-05-01 DIAGNOSIS — T7840XS Allergy, unspecified, sequela: Secondary | ICD-10-CM

## 2021-05-01 DIAGNOSIS — K219 Gastro-esophageal reflux disease without esophagitis: Secondary | ICD-10-CM

## 2021-05-02 ENCOUNTER — Other Ambulatory Visit: Payer: Self-pay | Admitting: Nurse Practitioner

## 2021-05-02 DIAGNOSIS — J349 Unspecified disorder of nose and nasal sinuses: Secondary | ICD-10-CM

## 2021-05-18 ENCOUNTER — Other Ambulatory Visit: Payer: Self-pay | Admitting: Family Medicine

## 2021-05-18 DIAGNOSIS — I10 Essential (primary) hypertension: Secondary | ICD-10-CM

## 2021-06-21 ENCOUNTER — Encounter: Payer: Self-pay | Admitting: Family

## 2021-06-21 ENCOUNTER — Telehealth (INDEPENDENT_AMBULATORY_CARE_PROVIDER_SITE_OTHER): Payer: Medicare Other | Admitting: Family

## 2021-06-21 DIAGNOSIS — R197 Diarrhea, unspecified: Secondary | ICD-10-CM

## 2021-06-21 DIAGNOSIS — R110 Nausea: Secondary | ICD-10-CM

## 2021-06-21 DIAGNOSIS — R1031 Right lower quadrant pain: Secondary | ICD-10-CM | POA: Diagnosis not present

## 2021-06-21 MED ORDER — ONDANSETRON HCL 4 MG PO TABS: 4.0000 mg | ORAL_TABLET | Freq: Three times a day (TID) | ORAL | 0 refills | Status: DC | PRN

## 2021-06-21 MED ORDER — LOPERAMIDE HCL 2 MG PO TABS
2.0000 mg | ORAL_TABLET | Freq: Four times a day (QID) | ORAL | 0 refills | Status: DC | PRN
Start: 2021-06-21 — End: 2021-08-01

## 2021-06-21 NOTE — Progress Notes (Signed)
Virtual Visit  Note Due to COVID-19 pandemic this visit was conducted virtually. This visit type was conducted due to national recommendations for restrictions regarding the COVID-19 Pandemic (e.g. social distancing, sheltering in place) in an effort to limit this patient's exposure and mitigate transmission in our community. All issues noted in this document were discussed and addressed.  A physical exam was not performed with this format.  I connected with [name] on 06/21/21 at 12:57 pm by telephone and verified that I am speaking with the correct person using two identifiers. Nicole Aguirre is currently located at home and no one is currently with her during visit. The provider, Evelina Dun, FNP is located in their office at time of visit.  I discussed the limitations, risks, security and privacy concerns of performing an evaluation and management service by telephone and the availability of in person appointments. I also discussed with the patient that there may be a patient responsible charge related to this service. The patient expressed understanding and agreed to proceed.   History and Present Illness:  Pt calls the office today with nausea and diarrhea for the last 4 days.  Abdominal Pain This is a new problem. Associated symptoms include diarrhea, flatus, headaches, myalgias and vomiting. Pertinent negatives include no fever.  Diarrhea  This is a new problem. The current episode started in the past 7 days. The problem occurs 5 to 10 times per day. The problem has been unchanged. The stool consistency is described as Watery. The patient states that diarrhea does not awaken her from sleep. Associated symptoms include abdominal pain, bloating, coughing, headaches, increased flatus, myalgias and vomiting. Pertinent negatives include no chills or fever. The treatment provided moderate relief.    Review of Systems  Constitutional:  Negative for chills and fever.   Respiratory:  Positive for cough.   Gastrointestinal:  Positive for abdominal pain, bloating, diarrhea, flatus and vomiting.  Musculoskeletal:  Positive for myalgias.  Neurological:  Positive for headaches.  All other systems reviewed and are negative.   Observations/Objective: No SOB or distress noted, reports RLQ pain with palpation   Assessment and Plan: 1. Diarrhea, unspecified type - CBC with Differential/Platelet; Future - BMP8+EGFR; Future - loperamide (IMODIUM A-D) 2 MG tablet; Take 1 tablet (2 mg total) by mouth 4 (four) times daily as needed for diarrhea or loose stools.  Dispense: 30 tablet; Refill: 0  2. Nausea - CBC with Differential/Platelet; Future - BMP8+EGFR; Future - ondansetron (ZOFRAN) 4 MG tablet; Take 1 tablet (4 mg total) by mouth every 8 (eight) hours as needed for nausea or vomiting.  Dispense: 20 tablet; Refill: 0  3. Right lower quadrant abdominal pain - CBC with Differential/Platelet; Future - BMP8+EGFR; Future  Labs pending  Force fluids Zofran as needed and Imodium  Bland diet If pain continues in next 24-48 hours call office.  If pain worsens go to ED    I discussed the assessment and treatment plan with the patient. The patient was provided an opportunity to ask questions and all were answered. The patient agreed with the plan and demonstrated an understanding of the instructions.   The patient was advised to call back or seek an in-person evaluation if the symptoms worsen or if the condition fails to improve as anticipated.  The above assessment and management plan was discussed with the patient. The patient verbalized understanding of and has agreed to the management plan. Patient is aware to call the clinic if symptoms persist or worsen. Patient is  aware when to return to the clinic for a follow-up visit. Patient educated on when it is appropriate to go to the emergency department.   Time call ended:  1:09 pm   I provided 12 minutes of   non face-to-face time during this encounter.    Evelina Dun, FNP

## 2021-06-22 LAB — CBC WITH DIFFERENTIAL/PLATELET
Basophils Absolute: 0 10*3/uL (ref 0.0–0.2)
Basos: 0 %
EOS (ABSOLUTE): 0.1 10*3/uL (ref 0.0–0.4)
Eos: 3 %
Hematocrit: 39.7 % (ref 34.0–46.6)
Hemoglobin: 12.8 g/dL (ref 11.1–15.9)
Immature Grans (Abs): 0 10*3/uL (ref 0.0–0.1)
Immature Granulocytes: 0 %
Lymphocytes Absolute: 1.6 10*3/uL (ref 0.7–3.1)
Lymphs: 39 %
MCH: 29.7 pg (ref 26.6–33.0)
MCHC: 32.2 g/dL (ref 31.5–35.7)
MCV: 92 fL (ref 79–97)
Monocytes Absolute: 0.3 10*3/uL (ref 0.1–0.9)
Monocytes: 7 %
Neutrophils Absolute: 2.1 10*3/uL (ref 1.4–7.0)
Neutrophils: 51 %
Platelets: 238 10*3/uL (ref 150–450)
RBC: 4.31 x10E6/uL (ref 3.77–5.28)
RDW: 13.2 % (ref 11.7–15.4)
WBC: 4.2 10*3/uL (ref 3.4–10.8)

## 2021-06-22 LAB — BMP8+EGFR
BUN/Creatinine Ratio: 9 — ABNORMAL LOW (ref 12–28)
BUN: 7 mg/dL — ABNORMAL LOW (ref 8–27)
CO2: 23 mmol/L (ref 20–29)
Calcium: 9.3 mg/dL (ref 8.7–10.3)
Chloride: 103 mmol/L (ref 96–106)
Creatinine, Ser: 0.76 mg/dL (ref 0.57–1.00)
Glucose: 80 mg/dL (ref 70–99)
Potassium: 4 mmol/L (ref 3.5–5.2)
Sodium: 139 mmol/L (ref 134–144)
eGFR: 85 mL/min/{1.73_m2} (ref >59)

## 2021-07-08 ENCOUNTER — Other Ambulatory Visit: Payer: Self-pay | Admitting: Family Medicine

## 2021-07-08 DIAGNOSIS — E559 Vitamin D deficiency, unspecified: Secondary | ICD-10-CM

## 2021-07-19 ENCOUNTER — Other Ambulatory Visit: Payer: Self-pay | Admitting: Family Medicine

## 2021-07-19 DIAGNOSIS — M797 Fibromyalgia: Secondary | ICD-10-CM

## 2021-08-01 ENCOUNTER — Other Ambulatory Visit: Payer: Self-pay | Admitting: Family Medicine

## 2021-08-01 DIAGNOSIS — R197 Diarrhea, unspecified: Secondary | ICD-10-CM

## 2021-08-01 DIAGNOSIS — T7840XS Allergy, unspecified, sequela: Secondary | ICD-10-CM

## 2021-08-09 ENCOUNTER — Other Ambulatory Visit: Payer: Self-pay | Admitting: Family Medicine

## 2021-08-09 DIAGNOSIS — I10 Essential (primary) hypertension: Secondary | ICD-10-CM

## 2021-08-10 ENCOUNTER — Encounter: Payer: Medicare Other | Admitting: Family Medicine

## 2021-09-05 ENCOUNTER — Other Ambulatory Visit: Payer: Self-pay | Admitting: Family Medicine

## 2021-09-07 ENCOUNTER — Other Ambulatory Visit: Payer: Self-pay | Admitting: Family

## 2021-09-07 DIAGNOSIS — R197 Diarrhea, unspecified: Secondary | ICD-10-CM

## 2021-09-15 ENCOUNTER — Encounter: Payer: Self-pay | Admitting: Family Medicine

## 2021-09-15 ENCOUNTER — Ambulatory Visit (INDEPENDENT_AMBULATORY_CARE_PROVIDER_SITE_OTHER): Payer: Medicare Other | Admitting: Family Medicine

## 2021-09-15 VITALS — BP 107/74 | HR 65 | Temp 97.2°F | Ht 59.0 in | Wt 161.2 lb

## 2021-09-15 DIAGNOSIS — J0100 Acute maxillary sinusitis, unspecified: Secondary | ICD-10-CM | POA: Diagnosis not present

## 2021-09-15 DIAGNOSIS — J069 Acute upper respiratory infection, unspecified: Secondary | ICD-10-CM

## 2021-09-15 MED ORDER — AMOXICILLIN-POT CLAVULANATE 875-125 MG PO TABS: 1.0000 | ORAL_TABLET | Freq: Two times a day (BID) | ORAL | 0 refills | Status: DC

## 2021-09-15 MED ORDER — PSEUDOEPHEDRINE-GUAIFENESIN ER 60-600 MG PO TB12: 1.0000 | ORAL_TABLET | Freq: Two times a day (BID) | ORAL | 0 refills | Status: AC

## 2021-09-15 NOTE — Progress Notes (Signed)
Chief Complaint  Patient presents with   Cough   Nasal Congestion   CHEST CONGESTION   Generalized Body Aches    HPI  Patient presents today for Patient presents with upper respiratory congestion. Rhinorrhea that is frequently purulent. Has posterior drainage. There is moderate sore throat. Patient reports coughing frequently as well.  Thick white to yellow sputum noted. There is no fever, chills or sweats. The patient denies being short of breath. Onset was 7-10 days ago. Gradually worsening. Much worse starting yesterday. Tried OTCs without improvement. INhaler helps open her up. Face pain at cheeks.   PMH: Smoking status noted ROS: Per HPI  Objective: BP 107/74   Pulse 65   Temp (!) 97.2 F (36.2 C)   Ht '4\' 11"'$  (1.499 m)   Wt 161 lb 3.2 oz (73.1 kg)   SpO2 95%   BMI 32.56 kg/m  Gen: NAD, alert, cooperative with exam HEENT: NCAT, Nasal passages swollen, red TMS RED CV: RRR, good S1/S2, no murmur Resp: Bronchitis changes with scattered wheezes, non-labored Ext: No edema, warm Neuro: Alert and oriented, No gross deficits  Assessment and plan:  1. Upper respiratory infection with cough and congestion   2. Acute maxillary sinusitis, recurrence not specified     Meds ordered this encounter  Medications   amoxicillin-clavulanate (AUGMENTIN) 875-125 MG tablet    Sig: Take 1 tablet by mouth 2 (two) times daily. Take all of this medication    Dispense:  20 tablet    Refill:  0   pseudoephedrine-guaifenesin (MUCINEX D) 60-600 MG 12 hr tablet    Sig: Take 1 tablet by mouth every 12 (twelve) hours for 14 days. As needed for congestion    Dispense:  20 tablet    Refill:  0    Orders Placed This Encounter  Procedures   Novel Coronavirus, NAA ([facility])    Order Specific Question:   Previously tested for COVID-19    Answer:   No    Order Specific Question:   Resident in a congregate (group) care setting    Answer:   No    Order Specific Question:   Is the patient student?     Answer:   No    Order Specific Question:   Employed in healthcare setting    Answer:   Unknown    Order Specific Question:   Pregnant    Answer:   No    Order Specific Question:   Has patient completed COVID vaccination(s) (2 doses of Pfizer/Moderna 1 dose of [employer])    Answer:   Unknown    Follow up as needed.  Claretta Fraise, MD

## 2021-09-16 LAB — NOVEL CORONAVIRUS, NAA: SARS-CoV-2, NAA: DETECTED — AB

## 2021-09-20 ENCOUNTER — Other Ambulatory Visit: Payer: Self-pay | Admitting: Family Medicine

## 2021-09-20 ENCOUNTER — Encounter: Payer: Medicare Other | Admitting: Family Medicine

## 2021-09-20 MED ORDER — HYDROCODONE BIT-HOMATROP MBR 5-1.5 MG/5ML PO SOLN: 5.0000 mL | Freq: Four times a day (QID) | ORAL | 0 refills | Status: AC | PRN

## 2021-09-20 NOTE — Progress Notes (Signed)
Pt. Was notified of results and plan of action. WS

## 2021-09-21 ENCOUNTER — Telehealth: Payer: Self-pay | Admitting: Family Medicine

## 2021-09-21 MED ORDER — FLUTICASONE PROPIONATE 50 MCG/ACT NA SUSP: NASAL | 3 refills | Status: DC

## 2021-09-21 NOTE — Telephone Encounter (Signed)
Refill sent.

## 2021-09-21 NOTE — Telephone Encounter (Signed)
  Prescription Request  09/21/2021  Is this a "Controlled Substance" medicine? NO  Have you seen your PCP in the last 2 weeks? Pt last seen on 09/15/2021  If YES, route message to pool  -  If NO, patient needs to be scheduled for appointment.  What is the name of the medication or equipment? fluticasone (FLONASE) 50 MCG/ACT nasal spray  Have you contacted your pharmacy to request a refill? yes   Which pharmacy would you like this sent to? [facility]   Patient notified that their request is being sent to the clinical staff for review and that they should receive a response within 2 business days.

## 2021-09-27 NOTE — Telephone Encounter (Signed)
TC back to [facility] PCP answered fax, directions 1 spray each nostril 1 time daily [facility] rep wanted this decrease rechecked w/ the provider, holding off for response on second fax which is on PCP's desk.

## 2021-09-27 NOTE — Telephone Encounter (Signed)
Pt says that [facility] needs clarification on directions before filling medication. Frequency of use of med. Please call [facility].

## 2021-09-28 ENCOUNTER — Encounter: Payer: Self-pay | Admitting: Family Medicine

## 2021-09-30 NOTE — Telephone Encounter (Signed)
Faxed completed form back to pharmacy.

## 2021-10-05 ENCOUNTER — Ambulatory Visit (INDEPENDENT_AMBULATORY_CARE_PROVIDER_SITE_OTHER): Payer: Medicare Other | Admitting: Family Medicine

## 2021-10-05 ENCOUNTER — Encounter: Payer: Self-pay | Admitting: Family Medicine

## 2021-10-05 VITALS — BP 126/84 | HR 57 | Temp 99.0°F | Wt 159.0 lb

## 2021-10-05 DIAGNOSIS — J069 Acute upper respiratory infection, unspecified: Secondary | ICD-10-CM | POA: Diagnosis not present

## 2021-10-05 MED ORDER — PSEUDOEPH-BROMPHEN-DM 30-2-10 MG/5ML PO SYRP: 5.0000 mL | ORAL_SOLUTION | Freq: Four times a day (QID) | ORAL | 0 refills | Status: DC | PRN

## 2021-10-05 MED ORDER — AZITHROMYCIN 250 MG PO TABS: ORAL_TABLET | ORAL | 0 refills | Status: DC

## 2021-10-05 MED ORDER — PREDNISONE 20 MG PO TABS: 40.0000 mg | ORAL_TABLET | Freq: Every day | ORAL | 0 refills | Status: AC

## 2021-10-05 NOTE — Progress Notes (Signed)
Subjective:  Patient ID: Nicole Aguirre, female    DOB: 07-Jul-1950, 71 y.o.   MRN: [id number]  Patient Care Team: [name], FNP as PCP - General (Family Medicine)   Chief Complaint:  Cough (Persistent cough/Congestion/Drainage/SOB/Weakness/Loss of appetite)   HPI: Nicole Aguirre is a 71 y.o. female presenting on 10/05/2021 for Cough (Persistent cough/Congestion/Drainage/SOB/Weakness/Loss of appetite)   Ongoing symptoms despite treatment with Augmentin and Mucinex. Now with low grade fever over last 5 days.   Cough This is a recurrent problem. Episode onset: 09/04/2021. The problem has been waxing and waning. The problem occurs constantly. The cough is Productive of sputum. Associated symptoms include chills, a fever, nasal congestion, postnasal drip, rhinorrhea and wheezing. Pertinent negatives include no chest pain, ear congestion, ear pain, headaches, heartburn, hemoptysis, myalgias, rash, sore throat, shortness of breath, sweats or weight loss. Nothing aggravates the symptoms. She has tried a beta-agonist inhaler, leukotriene antagonists, prescription cough suppressant and rest (augmentin) for the symptoms. The treatment provided mild relief.     Relevant past medical, surgical, family, and social history reviewed and updated as indicated.  Allergies and medications reviewed and updated. Data reviewed: Chart in Epic.   Past Medical History:  Diagnosis Date   Allergy    Fibromyalgia    GERD (gastroesophageal reflux disease)    Hyperlipidemia    Hypertension    PVC (premature ventricular contraction)     Past Surgical History:  Procedure Laterality Date   ABDOMINAL HYSTERECTOMY     APPENDECTOMY  1985   CATARACT EXTRACTION, BILATERAL     CHOLECYSTECTOMY  2010    Social History   Socioeconomic History   Marital status: Married    Spouse name: [name]   Number of children: 2   Years of education: 10th   Highest education level: GED or equivalent   Occupational History   Occupation: Dietary    Comment: Adult nurse   Occupation: retired  Tobacco Use   Smoking status: Former    Packs/day: 1.00    Years: 4.00    Total pack years: 4.00    Types: Cigarettes    Quit date: 07/25/1976    Years since quitting: 45.2   Smokeless tobacco: Never  Vaping Use   Vaping Use: Never used  Substance and Sexual Activity   Alcohol use: Never   Drug use: Never   Sexual activity: Yes    Birth control/protection: Surgical  Other Topics Concern   Not on file  Social History Narrative   2 sons   5 grandchildren.   Social Determinants of Health   Financial Resource Strain: Low Risk  (03/14/2021)   Overall Financial Resource Strain (CARDIA)    Difficulty of Paying Living Expenses: Not hard at all  Food Insecurity: No Food Insecurity (03/14/2021)   Hunger Vital Sign    Worried About Running Out of Food in the Last Year: Never true    Ran Out of Food in the Last Year: Never true  Transportation Needs: No Transportation Needs (03/14/2021)   PRAPARE - Hydrologist (Medical): No    Lack of Transportation (Non-Medical): No  Physical Activity: Insufficiently Active (03/14/2021)   Exercise Vital Sign    Days of Exercise per Week: 3 days    Minutes of Exercise per Session: 30 min  Stress: No Stress Concern Present (03/14/2021)   [place]    Feeling of Stress : Not at all  Social Connections:  Socially Integrated (03/14/2021)   Social Connection and Isolation Panel [NHANES]    Frequency of Communication with Friends and Family: More than three times a week    Frequency of Social Gatherings with Friends and Family: Twice a week    Attends Religious Services: More than 4 times per year    Active Member of [employer] or Organizations: Yes    Attends Music therapist: More than 4 times per year    Marital Status: Married  Human resources officer Violence: Not  At Risk (03/14/2021)   Humiliation, Afraid, Rape, and Kick questionnaire    Fear of Current or Ex-Partner: No    Emotionally Abused: No    Physically Abused: No    Sexually Abused: No    Outpatient Encounter Medications as of 10/05/2021  Medication Sig   albuterol (VENTOLIN HFA) 108 (90 Base) MCG/ACT inhaler Inhale 2 puffs into the lungs every 6 (six) hours as needed.   amLODipine (NORVASC) 10 MG tablet TAKE 1 TABLET BY MOUTH DAILY   atorvastatin (LIPITOR) 40 MG tablet TAKE 1 TABLET BY MOUTH AT  BEDTIME   azithromycin (ZITHROMAX Z-PAK) 250 MG tablet As directed   brompheniramine-pseudoephedrine-DM 30-2-10 MG/5ML syrup Take 5 mLs by mouth 4 (four) times daily as needed.   cetirizine (ZYRTEC) 10 MG tablet TAKE 1 TABLET BY MOUTH EVERY DAY   diclofenac Sodium (VOLTAREN) 1 % GEL APPLY 2 GRAMS TO AFFECTED AREA 4 TIMES A DAY   fluticasone (FLONASE) 50 MCG/ACT nasal spray 1 spray in each nostril   gabapentin (NEURONTIN) 300 MG capsule TAKE 1 CAPSULE BY MOUTH 3 TIMES  DAILY   hyoscyamine (LEVSIN) 0.125 MG tablet Take 1 tablet (0.125 mg total) by mouth every 4 (four) hours as needed for cramping.   loperamide (IMODIUM) 2 MG capsule TAKE 1 CAPSULE (2 MG TOTAL) BY MOUTH 4 (FOUR) TIMES DAILY AS NEEDED FOR DIARRHEA OR LOOSE STOOLS.   Magnesium 200 MG TABS Take 200 mg by mouth daily.   metoprolol succinate (TOPROL-XL) 25 MG 24 hr tablet Take 0.5 tablets (12.5 mg total) by mouth daily.   montelukast (SINGULAIR) 10 MG tablet TAKE 1 TABLET BY MOUTH  DAILY   omeprazole (PRILOSEC) 40 MG capsule TAKE 1 CAPSULE BY MOUTH DAILY   ondansetron (ZOFRAN) 4 MG tablet Take 1 tablet (4 mg total) by mouth every 8 (eight) hours as needed for nausea or vomiting.   predniSONE (DELTASONE) 20 MG tablet Take 2 tablets (40 mg total) by mouth daily with breakfast for 5 days.   Vitamin D, Ergocalciferol, (DRISDOL) 1.25 MG (50000 UNIT) CAPS capsule TAKE 1 CAPSULE (50,000 UNITS TOTAL) BY MOUTH EVERY 7 (SEVEN) DAYS   [DISCONTINUED]  amoxicillin-clavulanate (AUGMENTIN) 875-125 MG tablet Take 1 tablet by mouth 2 (two) times daily. Take all of this medication   [DISCONTINUED] brompheniramine-pseudoephedrine-DM 30-2-10 MG/5ML syrup Take 5 mLs by mouth 4 (four) times daily as needed.   lisinopril (ZESTRIL) 5 MG tablet Take by mouth. (Patient not taking: Reported on 10/05/2021)   No facility-administered encounter medications on file as of 10/05/2021.    Allergies  Allergen Reactions   Hctz [Hydrochlorothiazide] Other (See Comments)    cramping    Review of Systems  Constitutional:  Positive for activity change, chills, fatigue and fever. Negative for appetite change, diaphoresis, unexpected weight change and weight loss.  HENT:  Positive for congestion, postnasal drip and rhinorrhea. Negative for dental problem, drooling, ear discharge, ear pain, facial swelling, hearing loss, mouth sores, nosebleeds, sinus pressure, sinus pain, sneezing, sore  throat, tinnitus, trouble swallowing and voice change.   Respiratory:  Positive for cough and wheezing. Negative for hemoptysis, shortness of breath and stridor.   Cardiovascular:  Negative for chest pain.  Gastrointestinal:  Negative for abdominal pain, diarrhea, heartburn, nausea and vomiting.  Genitourinary:  Negative for decreased urine volume and difficulty urinating.  Musculoskeletal:  Negative for arthralgias and myalgias.  Skin:  Negative for rash.  Neurological:  Negative for dizziness, weakness, light-headedness and headaches.  Psychiatric/Behavioral:  Negative for confusion.   All other systems reviewed and are negative.       Objective:  BP 126/84   Pulse (!) 57   Temp 99 F (37.2 C)   Wt 159 lb (72.1 kg)   SpO2 95%   BMI 32.11 kg/m    Wt Readings from Last 3 Encounters:  10/05/21 159 lb (72.1 kg)  09/15/21 161 lb 3.2 oz (73.1 kg)  04/20/21 163 lb (73.9 kg)    Physical Exam Vitals and nursing note reviewed.  Constitutional:      General: She is not in  acute distress.    Appearance: Normal appearance. She is well-developed and well-groomed. She is obese. She is not ill-appearing, toxic-appearing or diaphoretic.  HENT:     Head: Normocephalic and atraumatic.     Jaw: There is normal jaw occlusion.     Right Ear: Hearing normal.     Left Ear: Hearing normal.     Nose: Congestion present.     Mouth/Throat:     Lips: Pink.     Mouth: Mucous membranes are moist.     Pharynx: Oropharynx is clear. Uvula midline. No oropharyngeal exudate or posterior oropharyngeal erythema.  Eyes:     General: Lids are normal.     Pupils: Pupils are equal, round, and reactive to light.  Neck:     Thyroid: No thyroid mass, thyromegaly or thyroid tenderness.     Vascular: No carotid bruit or JVD.     Trachea: Trachea and phonation normal.  Cardiovascular:     Rate and Rhythm: Normal rate and regular rhythm.     Chest Wall: PMI is not displaced.     Pulses: Normal pulses.     Heart sounds: Normal heart sounds. No murmur heard.    No friction rub. No gallop.  Pulmonary:     Effort: Pulmonary effort is normal. No respiratory distress.     Breath sounds: Wheezing present.  Abdominal:     General: There is no abdominal bruit.     Palpations: Abdomen is soft. There is no hepatomegaly or splenomegaly.     Hernia: No hernia is present.  Musculoskeletal:        General: Normal range of motion.     Cervical back: Normal range of motion and neck supple.     Right lower leg: No edema.     Left lower leg: No edema.  Lymphadenopathy:     Cervical: No cervical adenopathy.  Skin:    General: Skin is warm and dry.     Capillary Refill: Capillary refill takes less than 2 seconds.     Coloration: Skin is not cyanotic, jaundiced or pale.     Findings: No rash.  Neurological:     General: No focal deficit present.     Mental Status: She is alert and oriented to person, place, and time.     Sensory: Sensation is intact.     Motor: Motor function is intact.      Coordination: Coordination  is intact.     Gait: Gait is intact.     Deep Tendon Reflexes: Reflexes are normal and symmetric.  Psychiatric:        Attention and Perception: Attention and perception normal.        Mood and Affect: Mood and affect normal.        Speech: Speech normal.        Behavior: Behavior normal. Behavior is cooperative.        Thought Content: Thought content normal.        Cognition and Memory: Cognition and memory normal.        Judgment: Judgment normal.     Results for orders placed or performed in visit on 09/15/21  Novel Coronavirus, NAA ([facility])   Specimen: Nasopharyngeal(NP) swabs in vial transport medium  Result Value Ref Range   SARS-CoV-2, NAA Detected (A) Not Detected       Pertinent labs & imaging results that were available during my care of the patient were reviewed by me and considered in my medical decision making.  Assessment & Plan:  [place] was seen today for cough.  Diagnoses and all orders for this visit:  Upper respiratory infection with cough and congestion Has been treated with Augmentin, will add Zithromax for CAP coverage as pt is still symptomatic and now running low grade fever. Will also burst with steroids and prescribe bromfed. Pt aware of red flags. Report new, worsening, or persistent symptoms.  -     predniSONE (DELTASONE) 20 MG tablet; Take 2 tablets (40 mg total) by mouth daily with breakfast for 5 days. -     brompheniramine-pseudoephedrine-DM 30-2-10 MG/5ML syrup; Take 5 mLs by mouth 4 (four) times daily as needed. -     azithromycin (ZITHROMAX Z-PAK) 250 MG tablet; As directed     Continue all other maintenance medications.  Follow up plan: Return if symptoms worsen or fail to improve.   Continue healthy lifestyle choices, including diet (rich in fruits, vegetables, and lean proteins, and low in salt and simple carbohydrates) and exercise (at least 30 minutes of moderate physical activity daily).  Educational  handout given for URI  The above assessment and management plan was discussed with the patient. The patient verbalized understanding of and has agreed to the management plan. Patient is aware to call the clinic if they develop any new symptoms or if symptoms persist or worsen. Patient is aware when to return to the clinic for a follow-up visit. Patient educated on when it is appropriate to go to the emergency department.   Monia Pouch, FNP-C [facility] [phone]

## 2021-10-06 ENCOUNTER — Other Ambulatory Visit: Payer: Self-pay | Admitting: Family Medicine

## 2021-10-06 DIAGNOSIS — M797 Fibromyalgia: Secondary | ICD-10-CM

## 2021-10-07 ENCOUNTER — Other Ambulatory Visit: Payer: Self-pay | Admitting: Family Medicine

## 2021-10-07 DIAGNOSIS — E559 Vitamin D deficiency, unspecified: Secondary | ICD-10-CM

## 2021-10-10 ENCOUNTER — Telehealth: Payer: Self-pay | Admitting: Family Medicine

## 2021-10-10 DIAGNOSIS — J069 Acute upper respiratory infection, unspecified: Secondary | ICD-10-CM

## 2021-10-10 MED ORDER — PSEUDOEPH-BROMPHEN-DM 30-2-10 MG/5ML PO SYRP: 5.0000 mL | ORAL_SOLUTION | Freq: Four times a day (QID) | ORAL | 0 refills | Status: DC | PRN

## 2021-10-10 NOTE — Telephone Encounter (Signed)
Pt had televisit 10/05/2021 and was prescribed predniSONE (DELTASONE) 20 MG tablet And azithromycin (ZITHROMAX Z-PAK) 250 MG tablet--she is finished with. Pt still feeling bad--congestion cough and drainage out of nose.  Pt is asking for brompheniramine-pseudoephedrine-DM 30-2-10 MG/5ML syrup. What else does pt need to get rid of lingering covid. It has really helped. Use [facility].

## 2021-10-10 NOTE — Telephone Encounter (Signed)
Patient aware.

## 2021-10-10 NOTE — Telephone Encounter (Signed)
Prescription sent to pharmacy. If symptoms worsen or do not improve needs to be seen in person.

## 2021-10-13 ENCOUNTER — Other Ambulatory Visit: Payer: Self-pay | Admitting: Family Medicine

## 2021-10-13 DIAGNOSIS — E782 Mixed hyperlipidemia: Secondary | ICD-10-CM

## 2021-10-27 ENCOUNTER — Other Ambulatory Visit: Payer: Self-pay | Admitting: Family Medicine

## 2021-10-27 ENCOUNTER — Encounter: Payer: Self-pay | Admitting: Family

## 2021-10-27 ENCOUNTER — Ambulatory Visit (INDEPENDENT_AMBULATORY_CARE_PROVIDER_SITE_OTHER): Payer: Medicare Other | Admitting: Family

## 2021-10-27 DIAGNOSIS — K219 Gastro-esophageal reflux disease without esophagitis: Secondary | ICD-10-CM

## 2021-10-27 DIAGNOSIS — J301 Allergic rhinitis due to pollen: Secondary | ICD-10-CM

## 2021-10-27 DIAGNOSIS — J069 Acute upper respiratory infection, unspecified: Secondary | ICD-10-CM

## 2021-10-27 DIAGNOSIS — R0981 Nasal congestion: Secondary | ICD-10-CM

## 2021-10-27 DIAGNOSIS — T7840XS Allergy, unspecified, sequela: Secondary | ICD-10-CM

## 2021-10-27 MED ORDER — FEXOFENADINE HCL 180 MG PO TABS: 180.0000 mg | ORAL_TABLET | Freq: Every day | ORAL | 1 refills | Status: DC

## 2021-10-27 NOTE — Progress Notes (Signed)
Virtual Visit  Note Due to COVID-19 pandemic this visit was conducted virtually. This visit type was conducted due to national recommendations for restrictions regarding the COVID-19 Pandemic (e.g. social distancing, sheltering in place) in an effort to limit this patient's exposure and mitigate transmission in our community. All issues noted in this document were discussed and addressed.  A physical exam was not performed with this format.  I connected with [name] on 10/27/21 at 12:53 pm by telephone and verified that I am speaking with the correct person using two identifiers. Nicole Aguirre is currently located at home and no one is currently with her during visit. The provider, Evelina Dun, FNP is located in their office at time of visit.  I discussed the limitations, risks, security and privacy concerns of performing an evaluation and management service by telephone and the availability of in person appointments. I also discussed with the patient that there may be a patient responsible charge related to this service. The patient expressed understanding and agreed to proceed.  Ms. tane, biegler are scheduled for a virtual visit with your provider today.    Just as we do with appointments in the office, we must obtain your consent to participate.  Your consent will be active for this visit and any virtual visit you may have with one of our providers in the next 365 days.    If you have a MyChart account, I can also send a copy of this consent to you electronically.  All virtual visits are billed to your insurance company just like a traditional visit in the office.  As this is a virtual visit, video technology does not allow for your provider to perform a traditional examination.  This may limit your provider's ability to fully assess your condition.  If your provider identifies any concerns that need to be evaluated in person or the need to arrange testing such as labs, EKG,  etc, we will make arrangements to do so.    Although advances in technology are sophisticated, we cannot ensure that it will always work on either your end or our end.  If the connection with a video visit is poor, we may have to switch to a telephone visit.  With either a video or telephone visit, we are not always able to ensure that we have a secure connection.   I need to obtain your verbal consent now.   Are you willing to proceed with your visit today?   Nicole Aguirre has provided verbal consent on 10/27/2021 for a virtual visit (video or telephone).   Evelina Dun, [place] 10/27/2021  12:43 PM    History and Present Illness:  PT calls the office today with cough. She had COVID 09/17/21 and then was treated with a zpak and prednisone on 10/05/21. She reports she was feeling better, but then approx 5 days later she noticed increase congestion.  URI  This is a recurrent problem. The current episode started in the past 7 days. The problem has been gradually worsening. Associated symptoms include congestion (thick and white), ear pain and rhinorrhea. Pertinent negatives include no coughing, nausea, sinus pain, sore throat, swollen glands or vomiting. She has tried antihistamine and decongestant for the symptoms. The treatment provided mild relief.      Review of Systems  HENT:  Positive for congestion (thick and white), ear pain and rhinorrhea. Negative for sinus pain and sore throat.   Respiratory:  Negative for cough.   Gastrointestinal:  Negative for nausea  and vomiting.  All other systems reviewed and are negative.    Observations/Objective: No SOB or distress noted   Assessment and Plan: 1. Nasal congestion - fexofenadine (ALLEGRA ALLERGY) 180 MG tablet; Take 1 tablet (180 mg total) by mouth daily.  Dispense: 90 tablet; Refill: 1  2. Viral URI - fexofenadine (ALLEGRA ALLERGY) 180 MG tablet; Take 1 tablet (180 mg total) by mouth daily.  Dispense: 90 tablet; Refill:  1  3. Allergic rhinitis due to pollen, unspecified seasonality  - fexofenadine (ALLEGRA ALLERGY) 180 MG tablet; Take 1 tablet (180 mg total) by mouth daily.  Dispense: 90 tablet; Refill: 1  Will stop Zyrtec and start Allegra Continue flonase and Singulair  If fever or sinus pressure starts will send in antibiotic  - Take meds as prescribed - Use a cool mist humidifier  -Use saline nose sprays frequently -Force fluids -For any cough or congestion  Use plain Mucinex- regular strength or max strength is fine -For fever or aces or pains- take tylenol or ibuprofen. -Throat lozenges if help -Follow up if symptoms worsen or do not improve     I discussed the assessment and treatment plan with the patient. The patient was provided an opportunity to ask questions and all were answered. The patient agreed with the plan and demonstrated an understanding of the instructions.   The patient was advised to call back or seek an in-person evaluation if the symptoms worsen or if the condition fails to improve as anticipated.  The above assessment and management plan was discussed with the patient. The patient verbalized understanding of and has agreed to the management plan. Patient is aware to call the clinic if symptoms persist or worsen. Patient is aware when to return to the clinic for a follow-up visit. Patient educated on when it is appropriate to go to the emergency department.   Time call ended:  1:04 pm  I provided 11 minutes of  non face-to-face time during this encounter.    Evelina Dun, FNP

## 2021-10-29 ENCOUNTER — Other Ambulatory Visit: Payer: Self-pay | Admitting: Family Medicine

## 2021-10-29 DIAGNOSIS — T7840XS Allergy, unspecified, sequela: Secondary | ICD-10-CM

## 2021-11-01 ENCOUNTER — Other Ambulatory Visit: Payer: Self-pay | Admitting: *Deleted

## 2021-11-01 DIAGNOSIS — T7840XS Allergy, unspecified, sequela: Secondary | ICD-10-CM

## 2021-11-01 DIAGNOSIS — K219 Gastro-esophageal reflux disease without esophagitis: Secondary | ICD-10-CM

## 2021-11-01 MED ORDER — OMEPRAZOLE 40 MG PO CPDR: 40.0000 mg | DELAYED_RELEASE_CAPSULE | Freq: Every day | ORAL | 0 refills | Status: DC

## 2021-11-01 MED ORDER — MONTELUKAST SODIUM 10 MG PO TABS: 10.0000 mg | ORAL_TABLET | Freq: Every day | ORAL | 0 refills | Status: DC

## 2021-11-02 ENCOUNTER — Other Ambulatory Visit: Payer: Self-pay | Admitting: Family Medicine

## 2021-11-02 DIAGNOSIS — I10 Essential (primary) hypertension: Secondary | ICD-10-CM

## 2021-11-23 ENCOUNTER — Other Ambulatory Visit: Payer: Self-pay | Admitting: Family Medicine

## 2021-11-23 DIAGNOSIS — R197 Diarrhea, unspecified: Secondary | ICD-10-CM

## 2021-11-25 ENCOUNTER — Other Ambulatory Visit: Payer: Self-pay | Admitting: Family Medicine

## 2021-11-25 DIAGNOSIS — R197 Diarrhea, unspecified: Secondary | ICD-10-CM

## 2021-12-05 ENCOUNTER — Encounter: Payer: Medicare Other | Admitting: Family Medicine

## 2021-12-06 ENCOUNTER — Ambulatory Visit: Payer: Medicare Other

## 2021-12-19 ENCOUNTER — Ambulatory Visit (INDEPENDENT_AMBULATORY_CARE_PROVIDER_SITE_OTHER): Payer: Medicare Other | Admitting: Family Medicine

## 2021-12-19 ENCOUNTER — Telehealth: Payer: Self-pay | Admitting: Family Medicine

## 2021-12-19 VITALS — BP 132/77 | HR 66 | Temp 98.0°F | Ht 59.0 in | Wt 158.2 lb

## 2021-12-19 DIAGNOSIS — E782 Mixed hyperlipidemia: Secondary | ICD-10-CM

## 2021-12-19 DIAGNOSIS — E559 Vitamin D deficiency, unspecified: Secondary | ICD-10-CM | POA: Diagnosis not present

## 2021-12-19 DIAGNOSIS — J309 Allergic rhinitis, unspecified: Secondary | ICD-10-CM

## 2021-12-19 DIAGNOSIS — K582 Mixed irritable bowel syndrome: Secondary | ICD-10-CM | POA: Diagnosis not present

## 2021-12-19 DIAGNOSIS — K219 Gastro-esophageal reflux disease without esophagitis: Secondary | ICD-10-CM | POA: Diagnosis not present

## 2021-12-19 DIAGNOSIS — E538 Deficiency of other specified B group vitamins: Secondary | ICD-10-CM | POA: Diagnosis not present

## 2021-12-19 DIAGNOSIS — M797 Fibromyalgia: Secondary | ICD-10-CM | POA: Diagnosis not present

## 2021-12-19 DIAGNOSIS — K580 Irritable bowel syndrome with diarrhea: Secondary | ICD-10-CM | POA: Insufficient documentation

## 2021-12-19 DIAGNOSIS — I10 Essential (primary) hypertension: Secondary | ICD-10-CM

## 2021-12-19 DIAGNOSIS — I493 Ventricular premature depolarization: Secondary | ICD-10-CM | POA: Diagnosis not present

## 2021-12-19 DIAGNOSIS — Z23 Encounter for immunization: Secondary | ICD-10-CM | POA: Diagnosis not present

## 2021-12-19 MED ORDER — VITAMIN D (ERGOCALCIFEROL) 1.25 MG (50000 UNIT) PO CAPS: 50000.0000 [IU] | ORAL_CAPSULE | ORAL | 0 refills | Status: DC

## 2021-12-19 MED ORDER — ONDANSETRON HCL 4 MG PO TABS: 4.0000 mg | ORAL_TABLET | Freq: Three times a day (TID) | ORAL | 0 refills | Status: DC | PRN

## 2021-12-19 MED ORDER — FEXOFENADINE HCL 180 MG PO TABS: 180.0000 mg | ORAL_TABLET | Freq: Every day | ORAL | 1 refills | Status: DC

## 2021-12-19 MED ORDER — LOPERAMIDE HCL 2 MG PO CAPS: ORAL_CAPSULE | ORAL | 0 refills | Status: DC

## 2021-12-19 NOTE — Telephone Encounter (Signed)
Patient would like to switch from Marjorie Smolder to [employer]. Please call back to let her know.

## 2021-12-19 NOTE — Progress Notes (Signed)
Established Patient Office Visit  Subjective   Patient ID: Nicole Aguirre, female    DOB: 14-Apr-1950  Age: 71 y.o. MRN: [id number]  Chief Complaint  Patient presents with   Medical Management of Chronic Issues   Hypertension   Gastroesophageal Reflux   Hyperlipidemia    Hypertension  Gastroesophageal Reflux  Hyperlipidemia   HTN Current meds - amlodipine 10 mg, metoprolol 12.5 mg Checking BP at home ranging 130s/70s-80s Pertinent ROS:  Visual Disturbances - No Chest pain - No Dyspnea - No Palpitations - No LE edema - No  She will see cardiology later this week. She has a history of PVCs. She takes metoprolol and magnesium for this.   2. GERD Compliant with medications - Yes Current medications - prilosec  40 mg Cough - No Sore throat - No Voice change - No Hemoptysis - No Dysphagia or dyspepsia - No Water brash - No Red Flags (weight loss, hematochezia, melena, weight loss, early satiety, fevers, odynophagia, or persistent vomiting) - No  3. Fibromyalgia She has been taking gabapentin TID. She has been thinking about going to a pain clinic or to rheumatology for this. She would like to think about this some more and then let me know. She was diagnosed over 20 years ago by rheumatology. She also takes advil prn.   4. IBS She has intermittent diarrhea and constipation with IBS. She has chronic intermittent abdominal cramping, bloating, and nausea because of this. She takes hyoscyamine prn and imodium prn. She also takes zofran prn. She takes the imodium once a week or so.   5. Vitamin defiency  She is taking B12 500 mg daily and vitamin D 50000u weekly.   6. Chronic allergic rhinitis She has been taking allegra, singular, and flonase with good control.   BP Readings from Last 3 Encounters:  12/19/21 132/77  10/05/21 126/84  09/15/21 107/74    Past Medical History:  Diagnosis Date   Allergy    Fibromyalgia    GERD (gastroesophageal reflux disease)     Hyperlipidemia    Hypertension    PVC (premature ventricular contraction)     ROS As per HPI.   Objective:     BP 132/77   Pulse 66   Temp 98 F (36.7 C) (Temporal)   Ht _0  (1.499 m)   Wt 158 lb 4 oz (71.8 kg)   SpO2 95%   BMI 31.96 kg/m  BP Readings from Last 3 Encounters:  12/19/21 132/77  10/05/21 126/84  09/15/21 107/74      Physical Exam Vitals and nursing note reviewed.  Constitutional:      General: She is not in acute distress.    Appearance: She is not ill-appearing, toxic-appearing or diaphoretic.  HENT:     Head: Normocephalic and atraumatic.  Cardiovascular:     Rate and Rhythm: Normal rate and regular rhythm.     Heart sounds: Normal heart sounds. No murmur heard. Pulmonary:     Effort: Pulmonary effort is normal. No respiratory distress.     Breath sounds: Normal breath sounds.  Abdominal:     General: Bowel sounds are normal. There is no distension.     Palpations: Abdomen is soft.     Tenderness: There is no abdominal tenderness. There is no guarding or rebound.  Musculoskeletal:     Cervical back: No rigidity or tenderness.     Right lower leg: No edema.     Left lower leg: No edema.  Skin:  General: Skin is warm and dry.  Neurological:     General: No focal deficit present.     Mental Status: She is alert and oriented to person, place, and time.  Psychiatric:        Mood and Affect: Mood normal.        Behavior: Behavior normal.        Thought Content: Thought content normal.        Judgment: Judgment normal.      No results found for any visits on 12/19/21.    The 10-year ASCVD risk score (Arnett DK, et al., 2019) is: 12.4%    Assessment & Plan:   [place] was seen today for medical management of chronic issues, hypertension, gastroesophageal reflux and hyperlipidemia.  Diagnoses and all orders for this visit:  Primary hypertension Well controlled on current regimen. Continue amlodipine, metoprolol.  -      CMP14+EGFR -     CBC with Differential/Platelet  PVC (premature ventricular contraction) Managed by cardiology. On magnesium and metoprolol.  -     Magnesium  Gastroesophageal reflux disease without esophagitis Well controlled on current regimen. Continue omeprazole.  -     CMP14+EGFR -     CBC with Differential/Platelet  Chronic allergic rhinitis Well controlled on current regimen. Continue flonase, allegra, singular.  -     fexofenadine (ALLEGRA ALLERGY) 180 MG tablet; Take 1 tablet (180 mg total) by mouth daily.  Mixed hyperlipidemia Last LDL was well controlled. Continue atorvastatin 40  Fibromyalgia Not well controlled. Continue gabapentin. Discussed referral to pain management vs rheumatology. She will let me know what she decides.   B12 deficiency On oral supplement.  -     Vitamin B12  Vitamin D deficiency On oral supplement.  -     VITAMIN D 25 Hydroxy (Vit-D Deficiency, Fractures) -     Vitamin D, Ergocalciferol, (DRISDOL) 1.25 MG (50000 UNIT) CAPS capsule; Take 1 capsule (50,000 Units total) by mouth every 7 (seven) days.  Irritable bowel syndrome with both constipation and diarrhea Imodium prn. Continue hyoscyamine. Zofran prn.  -     loperamide (IMODIUM) 2 MG capsule; TAKE 1 CAPSULE (2 MG TOTAL) BY MOUTH 4 (FOUR) TIMES DAILY AS NEEDED FOR DIARRHEA OR LOOSE STOOLS. -     ondansetron (ZOFRAN) 4 MG tablet; Take 1 tablet (4 mg total) by mouth every 8 (eight) hours as needed for nausea or vomiting.  Need for vaccination Prevnar today.   Return in about 6 months (around 06/20/2022) for CPE.   The patient indicates understanding of these issues and agrees with the plan.  Gwenlyn Perking, FNP

## 2021-12-20 NOTE — Telephone Encounter (Signed)
Lmtcb.

## 2021-12-20 NOTE — Telephone Encounter (Signed)
Patient states that she has only seen you for acute but she really liked you.  States she did not know why she did not pick you to start with.

## 2021-12-20 NOTE — Telephone Encounter (Signed)
Patient aware.

## 2021-12-21 DIAGNOSIS — E782 Mixed hyperlipidemia: Secondary | ICD-10-CM | POA: Diagnosis not present

## 2021-12-21 DIAGNOSIS — I10 Essential (primary) hypertension: Secondary | ICD-10-CM | POA: Diagnosis not present

## 2021-12-21 DIAGNOSIS — I493 Ventricular premature depolarization: Secondary | ICD-10-CM | POA: Diagnosis not present

## 2021-12-21 LAB — CMP14+EGFR
ALT: 14 IU/L (ref 0–32)
AST: 16 IU/L (ref 0–40)
Albumin/Globulin Ratio: 1.4 (ref 1.2–2.2)
Albumin: 4.3 g/dL (ref 3.9–4.9)
Alkaline Phosphatase: 64 IU/L (ref 44–121)
BUN/Creatinine Ratio: 17 (ref 12–28)
BUN: 14 mg/dL (ref 8–27)
Bilirubin Total: 0.4 mg/dL (ref 0.0–1.2)
CO2: 22 mmol/L (ref 20–29)
Calcium: 9.5 mg/dL (ref 8.7–10.3)
Chloride: 102 mmol/L (ref 96–106)
Creatinine, Ser: 0.83 mg/dL (ref 0.57–1.00)
Globulin, Total: 3 g/dL (ref 1.5–4.5)
Glucose: 79 mg/dL (ref 70–99)
Potassium: 4.2 mmol/L (ref 3.5–5.2)
Sodium: 139 mmol/L (ref 134–144)
Total Protein: 7.3 g/dL (ref 6.0–8.5)
eGFR: 76 mL/min/{1.73_m2} (ref >59)

## 2021-12-21 LAB — CBC WITH DIFFERENTIAL/PLATELET
Basophils Absolute: 0 10*3/uL (ref 0.0–0.2)
Basos: 0 %
EOS (ABSOLUTE): 0.1 10*3/uL (ref 0.0–0.4)
Eos: 3 %
Hematocrit: 40.7 % (ref 34.0–46.6)
Hemoglobin: 13.6 g/dL (ref 11.1–15.9)
Immature Grans (Abs): 0 10*3/uL (ref 0.0–0.1)
Immature Granulocytes: 0 %
Lymphocytes Absolute: 1.8 10*3/uL (ref 0.7–3.1)
Lymphs: 37 %
MCH: 31.1 pg (ref 26.6–33.0)
MCHC: 33.4 g/dL (ref 31.5–35.7)
MCV: 93 fL (ref 79–97)
Monocytes Absolute: 0.4 10*3/uL (ref 0.1–0.9)
Monocytes: 8 %
Neutrophils Absolute: 2.5 10*3/uL (ref 1.4–7.0)
Neutrophils: 52 %
Platelets: 240 10*3/uL (ref 150–450)
RBC: 4.38 x10E6/uL (ref 3.77–5.28)
RDW: 13.4 % (ref 11.7–15.4)
WBC: 4.8 10*3/uL (ref 3.4–10.8)

## 2021-12-21 LAB — VITAMIN B12: Vitamin B-12: 878 pg/mL (ref 232–1245)

## 2021-12-21 LAB — VITAMIN D 25 HYDROXY (VIT D DEFICIENCY, FRACTURES): Vit D, 25-Hydroxy: 69.8 ng/mL (ref 30.0–100.0)

## 2021-12-21 LAB — MAGNESIUM: Magnesium: 1.6 mg/dL (ref 1.6–2.3)

## 2021-12-23 ENCOUNTER — Telehealth: Payer: Self-pay | Admitting: Family Medicine

## 2021-12-23 ENCOUNTER — Other Ambulatory Visit: Payer: Self-pay | Admitting: Family Medicine

## 2021-12-23 DIAGNOSIS — K582 Mixed irritable bowel syndrome: Secondary | ICD-10-CM

## 2021-12-23 DIAGNOSIS — I10 Essential (primary) hypertension: Secondary | ICD-10-CM

## 2021-12-23 DIAGNOSIS — E559 Vitamin D deficiency, unspecified: Secondary | ICD-10-CM

## 2021-12-23 MED ORDER — MAGNESIUM 200 MG PO TABS: 200.0000 mg | ORAL_TABLET | Freq: Every day | ORAL | 3 refills | Status: DC

## 2021-12-23 NOTE — Telephone Encounter (Signed)
I spoke to pt and advised Magnesium is otc. Pt requested rx so per Marjorie Smolder verbal order-rx sent to pharmacy as requested.

## 2021-12-23 NOTE — Telephone Encounter (Signed)
  Prescription Request  12/23/2021  Is this a "Controlled Substance" medicine? NO  Have you seen your PCP in the last 2 weeks? YES ON 12/19/2021  If YES, route message to pool  -  If NO, patient needs to be scheduled for appointment.  What is the name of the medication or equipment? Magnesium 400 MG TABS   Have you contacted your pharmacy to request a refill? yes   Which pharmacy would you like this sent to? [facility]    Patient notified that their request is being sent to the clinical staff for review and that they should receive a response within 2 business days.

## 2022-01-04 ENCOUNTER — Other Ambulatory Visit: Payer: Self-pay | Admitting: Family Medicine

## 2022-01-04 DIAGNOSIS — M797 Fibromyalgia: Secondary | ICD-10-CM

## 2022-01-23 ENCOUNTER — Other Ambulatory Visit: Payer: Self-pay | Admitting: Family Medicine

## 2022-01-23 ENCOUNTER — Other Ambulatory Visit: Payer: Self-pay | Admitting: *Deleted

## 2022-01-23 DIAGNOSIS — T7840XS Allergy, unspecified, sequela: Secondary | ICD-10-CM

## 2022-01-23 DIAGNOSIS — M797 Fibromyalgia: Secondary | ICD-10-CM

## 2022-01-23 DIAGNOSIS — E559 Vitamin D deficiency, unspecified: Secondary | ICD-10-CM

## 2022-01-23 DIAGNOSIS — E782 Mixed hyperlipidemia: Secondary | ICD-10-CM

## 2022-01-23 DIAGNOSIS — K219 Gastro-esophageal reflux disease without esophagitis: Secondary | ICD-10-CM

## 2022-01-23 DIAGNOSIS — K582 Mixed irritable bowel syndrome: Secondary | ICD-10-CM

## 2022-01-23 DIAGNOSIS — I10 Essential (primary) hypertension: Secondary | ICD-10-CM

## 2022-01-23 MED ORDER — MONTELUKAST SODIUM 10 MG PO TABS: 10.0000 mg | ORAL_TABLET | Freq: Every day | ORAL | 1 refills | Status: DC

## 2022-01-23 MED ORDER — OMEPRAZOLE 40 MG PO CPDR: 40.0000 mg | DELAYED_RELEASE_CAPSULE | Freq: Every day | ORAL | 1 refills | Status: DC

## 2022-01-23 MED ORDER — ATORVASTATIN CALCIUM 40 MG PO TABS: 40.0000 mg | ORAL_TABLET | Freq: Every day | ORAL | 1 refills | Status: DC

## 2022-01-23 MED ORDER — FLUTICASONE PROPIONATE 50 MCG/ACT NA SUSP: NASAL | 1 refills | Status: DC

## 2022-01-24 MED ORDER — ONDANSETRON HCL 4 MG PO TABS: 4.0000 mg | ORAL_TABLET | Freq: Three times a day (TID) | ORAL | 0 refills | Status: DC | PRN

## 2022-01-24 MED ORDER — VITAMIN D (ERGOCALCIFEROL) 1.25 MG (50000 UNIT) PO CAPS: 50000.0000 [IU] | ORAL_CAPSULE | ORAL | 0 refills | Status: DC

## 2022-01-24 MED ORDER — LOPERAMIDE HCL 2 MG PO CAPS: ORAL_CAPSULE | ORAL | 0 refills | Status: DC

## 2022-01-31 ENCOUNTER — Ambulatory Visit (INDEPENDENT_AMBULATORY_CARE_PROVIDER_SITE_OTHER): Payer: Medicare Other | Admitting: Family Medicine

## 2022-01-31 ENCOUNTER — Encounter: Payer: Self-pay | Admitting: Family Medicine

## 2022-01-31 VITALS — BP 140/86 | HR 55 | Temp 97.7°F | Ht 59.0 in | Wt 159.2 lb

## 2022-01-31 DIAGNOSIS — J0140 Acute pansinusitis, unspecified: Secondary | ICD-10-CM | POA: Diagnosis not present

## 2022-01-31 MED ORDER — AMOXICILLIN-POT CLAVULANATE 875-125 MG PO TABS: 1.0000 | ORAL_TABLET | Freq: Two times a day (BID) | ORAL | 0 refills | Status: AC

## 2022-01-31 MED ORDER — GUAIFENESIN ER 600 MG PO TB12: 600.0000 mg | ORAL_TABLET | Freq: Two times a day (BID) | ORAL | 0 refills | Status: AC

## 2022-01-31 NOTE — Progress Notes (Signed)
Subjective:  Patient ID: Nicole Aguirre, female    DOB: 07-Jun-1950, 72 y.o.   MRN: [id number]  Patient Care Team: Baruch Gouty, FNP as PCP - General (Family Medicine)   Chief Complaint:  Nasal Congestion, Cough, Facial Pain, and Ear Pain (Bilateral x 2 weeks )   HPI: Nicole Aguirre is a 72 y.o. female presenting on 01/31/2022 for Nasal Congestion, Cough, Facial Pain, and Ear Pain (Bilateral x 2 weeks )   Sinus Problem This is a new problem. Episode onset: 2.5 weeks ago. The problem has been gradually worsening since onset. The pain is moderate. Associated symptoms include chills, congestion, coughing, ear pain, headaches, sinus pressure and a sore throat. Pertinent negatives include no diaphoresis, hoarse voice, neck pain, shortness of breath, sneezing or swollen glands. Past treatments include acetaminophen. The treatment provided no relief.     Relevant past medical, surgical, family, and social history reviewed and updated as indicated.  Allergies and medications reviewed and updated. Data reviewed: Chart in Epic.   Past Medical History:  Diagnosis Date   Allergy    Fibromyalgia    GERD (gastroesophageal reflux disease)    Hyperlipidemia    Hypertension    PVC (premature ventricular contraction)     Past Surgical History:  Procedure Laterality Date   ABDOMINAL HYSTERECTOMY     APPENDECTOMY  1985   CATARACT EXTRACTION, BILATERAL     CHOLECYSTECTOMY  2010    Social History   Socioeconomic History   Marital status: Married    Spouse name: [name]   Number of children: 2   Years of education: 10th   Highest education level: GED or equivalent  Occupational History   Occupation: Dietary    Comment: Adult nurse   Occupation: retired  Tobacco Use   Smoking status: Former    Packs/day: 1.00    Years: 4.00    Total pack years: 4.00    Types: Cigarettes    Quit date: 07/25/1976    Years since quitting: 45.5   Smokeless tobacco: Never  Vaping Use    Vaping Use: Never used  Substance and Sexual Activity   Alcohol use: Never   Drug use: Never   Sexual activity: Yes    Birth control/protection: Surgical  Other Topics Concern   Not on file  Social History Narrative   2 sons   5 grandchildren.   Social Determinants of Health   Financial Resource Strain: Low Risk  (03/14/2021)   Overall Financial Resource Strain (CARDIA)    Difficulty of Paying Living Expenses: Not hard at all  Food Insecurity: No Food Insecurity (03/14/2021)   Hunger Vital Sign    Worried About Running Out of Food in the Last Year: Never true    Ran Out of Food in the Last Year: Never true  Transportation Needs: No Transportation Needs (03/14/2021)   PRAPARE - Hydrologist (Medical): No    Lack of Transportation (Non-Medical): No  Physical Activity: Insufficiently Active (03/14/2021)   Exercise Vital Sign    Days of Exercise per Week: 3 days    Minutes of Exercise per Session: 30 min  Stress: No Stress Concern Present (03/14/2021)   [place]    Feeling of Stress : Not at all  Social Connections: [place] (03/14/2021)   Social Connection and Isolation Panel [NHANES]    Frequency of Communication with Friends and Family: More than three times a week  Frequency of Social Gatherings with Friends and Family: Twice a week    Attends Religious Services: More than 4 times per year    Active Member of [employer] or Organizations: Yes    Attends Music therapist: More than 4 times per year    Marital Status: Married  Human resources officer Violence: Not At Risk (03/14/2021)   Humiliation, Afraid, Rape, and Kick questionnaire    Fear of Current or Ex-Partner: No    Emotionally Abused: No    Physically Abused: No    Sexually Abused: No    Outpatient Encounter Medications as of 01/31/2022  Medication Sig   albuterol (VENTOLIN HFA) 108 (90 Base) MCG/ACT inhaler  Inhale 2 puffs into the lungs every 6 (six) hours as needed.   amoxicillin-clavulanate (AUGMENTIN) 875-125 MG tablet Take 1 tablet by mouth 2 (two) times daily for 7 days.   atorvastatin (LIPITOR) 40 MG tablet Take 1 tablet (40 mg total) by mouth at bedtime.   diclofenac Sodium (VOLTAREN) 1 % GEL APPLY 2 GRAMS TO AFFECTED AREA 4 TIMES A DAY   fexofenadine (ALLEGRA ALLERGY) 180 MG tablet Take 1 tablet (180 mg total) by mouth daily.   fluticasone (FLONASE) 50 MCG/ACT nasal spray 1 spray in each nostril   gabapentin (NEURONTIN) 300 MG capsule TAKE 1 CAPSULE BY MOUTH 3 TIMES  DAILY   guaiFENesin (MUCINEX) 600 MG 12 hr tablet Take 1 tablet (600 mg total) by mouth 2 (two) times daily for 10 days.   hyoscyamine (LEVSIN) 0.125 MG tablet Take 1 tablet (0.125 mg total) by mouth every 4 (four) hours as needed for cramping.   lisinopril (ZESTRIL) 5 MG tablet Take 2.5 mg by mouth daily.   loperamide (IMODIUM) 2 MG capsule TAKE 1 CAPSULE BY MOUTH 4 TIMES  DAILY AS NEEDED FOR DIARRHEA OR  LOOSE STOOLS   Magnesium 200 MG TABS Take 200 mg by mouth daily.   Magnesium 200 MG TABS Take 1 tablet (200 mg total) by mouth daily.   metoprolol succinate (TOPROL-XL) 25 MG 24 hr tablet TAKE 1/2 TABLET BY MOUTH EVERY DAY   montelukast (SINGULAIR) 10 MG tablet Take 1 tablet (10 mg total) by mouth daily.   omeprazole (PRILOSEC) 40 MG capsule Take 1 capsule (40 mg total) by mouth daily.   ondansetron (ZOFRAN) 4 MG tablet Take 1 tablet (4 mg total) by mouth every 8 (eight) hours as needed for nausea or vomiting.   Vitamin D, Ergocalciferol, (DRISDOL) 1.25 MG (50000 UNIT) CAPS capsule Take 1 capsule (50,000 Units total) by mouth every 7 (seven) days.   [DISCONTINUED] amLODipine (NORVASC) 10 MG tablet TAKE 1 TABLET BY MOUTH DAILY   No facility-administered encounter medications on file as of 01/31/2022.    Allergies  Allergen Reactions   Hctz [Hydrochlorothiazide] Other (See Comments)    cramping    Review of Systems   Constitutional:  Positive for activity change, appetite change and chills. Negative for diaphoresis, fatigue, fever and unexpected weight change.  HENT:  Positive for congestion, ear pain, postnasal drip, rhinorrhea, sinus pressure, sinus pain and sore throat. Negative for dental problem, drooling, facial swelling, hearing loss, hoarse voice, mouth sores, nosebleeds, sneezing, tinnitus, trouble swallowing and voice change.   Respiratory:  Positive for cough. Negative for apnea, choking, chest tightness, shortness of breath, wheezing and stridor.   Cardiovascular:  Negative for chest pain, palpitations and leg swelling.  Gastrointestinal:  Negative for abdominal pain, diarrhea, nausea and vomiting.  Genitourinary:  Negative for decreased urine volume and  difficulty urinating.  Musculoskeletal:  Negative for arthralgias, myalgias and neck pain.  Neurological:  Positive for headaches. Negative for dizziness, tremors, seizures, syncope, facial asymmetry, speech difficulty, weakness, light-headedness and numbness.  Psychiatric/Behavioral:  Negative for confusion.   All other systems reviewed and are negative.       Objective:  BP (!) 140/86   Pulse (!) 55   Temp 97.7 F (36.5 C) (Temporal)   Ht '4\' 11"'$  (1.499 m)   Wt 159 lb 3.2 oz (72.2 kg)   SpO2 95%   BMI 32.15 kg/m    Wt Readings from Last 3 Encounters:  01/31/22 159 lb 3.2 oz (72.2 kg)  12/19/21 158 lb 4 oz (71.8 kg)  10/05/21 159 lb (72.1 kg)    Physical Exam Vitals and nursing note reviewed.  Constitutional:      General: She is not in acute distress.    Appearance: Normal appearance. She is well-developed and well-groomed. She is obese. She is not ill-appearing, toxic-appearing or diaphoretic.  HENT:     Head: Normocephalic and atraumatic.     Jaw: There is normal jaw occlusion.     Right Ear: Hearing, ear canal and external ear normal. A middle ear effusion is present. Tympanic membrane is not erythematous.     Left Ear:  Hearing, ear canal and external ear normal. A middle ear effusion is present. Tympanic membrane is not erythematous.     Nose: Congestion present.     Right Turbinates: Swollen.     Left Turbinates: Swollen.     Right Sinus: Maxillary sinus tenderness and frontal sinus tenderness present.     Left Sinus: Maxillary sinus tenderness and frontal sinus tenderness present.     Mouth/Throat:     Lips: Pink.     Mouth: Mucous membranes are moist.     Pharynx: Oropharynx is clear. Uvula midline. Posterior oropharyngeal erythema present. No pharyngeal swelling, oropharyngeal exudate or uvula swelling.     Tonsils: No tonsillar exudate or tonsillar abscesses.  Eyes:     General: Lids are normal.     Extraocular Movements: Extraocular movements intact.     Conjunctiva/sclera: Conjunctivae normal.     Pupils: Pupils are equal, round, and reactive to light.  Neck:     Thyroid: No thyroid mass, thyromegaly or thyroid tenderness.     Vascular: No carotid bruit or JVD.     Trachea: Trachea and phonation normal.  Cardiovascular:     Rate and Rhythm: Normal rate and regular rhythm.     Chest Wall: PMI is not displaced.     Pulses: Normal pulses.     Heart sounds: Normal heart sounds. No murmur heard.    No friction rub. No gallop.  Pulmonary:     Effort: Pulmonary effort is normal. No respiratory distress.     Breath sounds: Normal breath sounds. No wheezing.  Abdominal:     General: There is no abdominal bruit.     Palpations: There is no hepatomegaly or splenomegaly.  Musculoskeletal:        General: Normal range of motion.     Cervical back: Normal range of motion and neck supple.     Right lower leg: No edema.     Left lower leg: No edema.  Lymphadenopathy:     Cervical: No cervical adenopathy.  Skin:    General: Skin is warm and dry.     Capillary Refill: Capillary refill takes less than 2 seconds.     Coloration: Skin is  not cyanotic, jaundiced or pale.     Findings: No rash.   Neurological:     General: No focal deficit present.     Mental Status: She is alert and oriented to person, place, and time.     Sensory: Sensation is intact.     Motor: Motor function is intact.     Coordination: Coordination is intact.     Gait: Gait is intact.     Deep Tendon Reflexes: Reflexes are normal and symmetric.  Psychiatric:        Attention and Perception: Attention and perception normal.        Mood and Affect: Mood and affect normal.        Speech: Speech normal.        Behavior: Behavior normal. Behavior is cooperative.        Thought Content: Thought content normal.        Cognition and Memory: Cognition and memory normal.        Judgment: Judgment normal.     Results for orders placed or performed in visit on 12/19/21  CMP14+EGFR  Result Value Ref Range   Glucose 79 70 - 99 mg/dL   BUN 14 8 - 27 mg/dL   Creatinine, Ser 0.83 0.57 - 1.00 mg/dL   eGFR 76 >59 mL/min/1.73   BUN/Creatinine Ratio 17 12 - 28   Sodium 139 134 - 144 mmol/L   Potassium 4.2 3.5 - 5.2 mmol/L   Chloride 102 96 - 106 mmol/L   CO2 22 20 - 29 mmol/L   Calcium 9.5 8.7 - 10.3 mg/dL   Total Protein 7.3 6.0 - 8.5 g/dL   Albumin 4.3 3.9 - 4.9 g/dL   Globulin, Total 3.0 1.5 - 4.5 g/dL   Albumin/Globulin Ratio 1.4 1.2 - 2.2   Bilirubin Total 0.4 0.0 - 1.2 mg/dL   Alkaline Phosphatase 64 44 - 121 IU/L   AST 16 0 - 40 IU/L   ALT 14 0 - 32 IU/L  CBC with Differential/Platelet  Result Value Ref Range   WBC 4.8 3.4 - 10.8 x10E3/uL   RBC 4.38 3.77 - 5.28 x10E6/uL   Hemoglobin 13.6 11.1 - 15.9 g/dL   Hematocrit 40.7 34.0 - 46.6 %   MCV 93 79 - 97 fL   MCH 31.1 26.6 - 33.0 pg   MCHC 33.4 31.5 - 35.7 g/dL   RDW 13.4 11.7 - 15.4 %   Platelets 240 150 - 450 x10E3/uL   Neutrophils 52 Not Estab. %   Lymphs 37 Not Estab. %   Monocytes 8 Not Estab. %   Eos 3 Not Estab. %   Basos 0 Not Estab. %   Neutrophils Absolute 2.5 1.4 - 7.0 x10E3/uL   Lymphocytes Absolute 1.8 0.7 - 3.1 x10E3/uL    Monocytes Absolute 0.4 0.1 - 0.9 x10E3/uL   EOS (ABSOLUTE) 0.1 0.0 - 0.4 x10E3/uL   Basophils Absolute 0.0 0.0 - 0.2 x10E3/uL   Immature Granulocytes 0 Not Estab. %   Immature Grans (Abs) 0.0 0.0 - 0.1 x10E3/uL  Vitamin B12  Result Value Ref Range   Vitamin B-12 878 232 - 1,245 pg/mL  VITAMIN D 25 Hydroxy (Vit-D Deficiency, Fractures)  Result Value Ref Range   Vit D, 25-Hydroxy 69.8 30.0 - 100.0 ng/mL  Magnesium  Result Value Ref Range   Magnesium 1.6 1.6 - 2.3 mg/dL       Pertinent labs & imaging results that were available during my care of the patient were reviewed by  me and considered in my medical decision making.  Assessment & Plan:  Nicole Aguirre was seen today for nasal congestion, cough, facial pain and ear pain.  Diagnoses and all orders for this visit:  Acute non-recurrent pansinusitis Ongoing and worsening symptoms for over 2 weeks. Failed symptomatic care at home. Will initiate below. Pt aware to continue symptomatic care at home. Report new, worsening, or persistent symptoms.  -     guaiFENesin (MUCINEX) 600 MG 12 hr tablet; Take 1 tablet (600 mg total) by mouth 2 (two) times daily for 10 days. -     amoxicillin-clavulanate (AUGMENTIN) 875-125 MG tablet; Take 1 tablet by mouth 2 (two) times daily for 7 days.     Continue all other maintenance medications.  Follow up plan: Return if symptoms worsen or fail to improve.   Continue healthy lifestyle choices, including diet (rich in fruits, vegetables, and lean proteins, and low in salt and simple carbohydrates) and exercise (at least 30 minutes of moderate physical activity daily).  Educational handout given for sinus infection  The above assessment and management plan was discussed with the patient. The patient verbalized understanding of and has agreed to the management plan. Patient is aware to call the clinic if they develop any new symptoms or if symptoms persist or worsen. Patient is aware when to return to the  clinic for a follow-up visit. Patient educated on when it is appropriate to go to the emergency department.   Monia Pouch, FNP-C [facility] [phone]

## 2022-02-06 ENCOUNTER — Encounter: Payer: Medicare Other | Admitting: Family Medicine

## 2022-02-07 ENCOUNTER — Telehealth: Payer: Self-pay | Admitting: Family Medicine

## 2022-02-07 NOTE — Telephone Encounter (Signed)
Patient seen on 1/16 for sinus infection and she is not feeling any better. Wants to know if something else can be called in. Please call back and advise.

## 2022-02-07 NOTE — Telephone Encounter (Signed)
Patient aware and verbalizes understanding. 

## 2022-02-07 NOTE — Telephone Encounter (Signed)
[email or web address]

## 2022-02-16 ENCOUNTER — Encounter: Payer: Self-pay | Admitting: Family Medicine

## 2022-02-16 ENCOUNTER — Ambulatory Visit (INDEPENDENT_AMBULATORY_CARE_PROVIDER_SITE_OTHER): Payer: Medicare Other | Admitting: Family Medicine

## 2022-02-16 DIAGNOSIS — J0140 Acute pansinusitis, unspecified: Secondary | ICD-10-CM | POA: Diagnosis not present

## 2022-02-16 MED ORDER — DOXYCYCLINE HYCLATE 100 MG PO TABS: 100.0000 mg | ORAL_TABLET | Freq: Two times a day (BID) | ORAL | 0 refills | Status: DC

## 2022-02-16 MED ORDER — PREDNISONE 20 MG PO TABS: 40.0000 mg | ORAL_TABLET | Freq: Every day | ORAL | 0 refills | Status: AC

## 2022-02-16 NOTE — Progress Notes (Signed)
Virtual Visit via telephone Note Due to COVID-19 pandemic this visit was conducted virtually. This visit type was conducted due to national recommendations for restrictions regarding the COVID-19 Pandemic (e.g. social distancing, sheltering in place) in an effort to limit this patient's exposure and mitigate transmission in our community. All issues noted in this document were discussed and addressed.  A physical exam was not performed with this format.   I connected with Nicole Aguirre on 02/16/2022 at 1040 by telephone and verified that I am speaking with the correct person using two identifiers. Nicole Aguirre is currently located at home and patient is currently with them during visit. The provider, Monia Pouch, FNP is located in their office at time of visit.  I discussed the limitations, risks, security and privacy concerns of performing an evaluation and management service by virtual visit and the availability of in person appointments. I also discussed with the patient that there may be a patient responsible charge related to this service. The patient expressed understanding and agreed to proceed.  Subjective:  Patient ID: Nicole Aguirre, female    DOB: 10/15/1950, 72 y.o.   MRN: [id number]  Chief Complaint:  Sinus Problem   HPI: Nicole Aguirre is a 72 y.o. female presenting on 02/16/2022 for Sinus Problem   Was seen and treated for sinusitis on 01/31/2022, states symptoms did not completely resolve with treatment regimen. Completed course of Augmentin and has been using Flonase and Mucinex daily.   Sinus Problem This is a recurrent problem. Episode onset: several weeks ago. The problem has been gradually worsening since onset. The pain is moderate. Associated symptoms include congestion, headaches and sinus pressure. Pertinent negatives include no chills, coughing, diaphoresis, ear pain, hoarse voice, neck pain, shortness of breath, sneezing, sore throat or swollen  glands. Past treatments include antibiotics, oral decongestants and nasal decongestants. The treatment provided no relief.     Relevant past medical, surgical, family, and social history reviewed and updated as indicated.  Allergies and medications reviewed and updated.   Past Medical History:  Diagnosis Date   Allergy    Fibromyalgia    GERD (gastroesophageal reflux disease)    Hyperlipidemia    Hypertension    PVC (premature ventricular contraction)     Past Surgical History:  Procedure Laterality Date   ABDOMINAL HYSTERECTOMY     APPENDECTOMY  1985   CATARACT EXTRACTION, BILATERAL     CHOLECYSTECTOMY  2010    Social History   Socioeconomic History   Marital status: Married    Spouse name: Nicole Aguirre   Number of children: 2   Years of education: 10th   Highest education level: GED or equivalent  Occupational History   Occupation: Dietary    Comment: Adult nurse   Occupation: retired  Tobacco Use   Smoking status: Former    Packs/day: 1.00    Years: 4.00    Total pack years: 4.00    Types: Cigarettes    Quit date: 07/25/1976    Years since quitting: 45.5   Smokeless tobacco: Never  Vaping Use   Vaping Use: Never used  Substance and Sexual Activity   Alcohol use: Never   Drug use: Never   Sexual activity: Yes    Birth control/protection: Surgical  Other Topics Concern   Not on file  Social History Narrative   2 sons   5 grandchildren.   Social Determinants of Health   Financial Resource Strain: Low Risk  (03/14/2021)   Overall Financial Resource Strain (CARDIA)  Difficulty of Paying Living Expenses: Not hard at all  Food Insecurity: No Food Insecurity (03/14/2021)   Hunger Vital Sign    Worried About Running Out of Food in the Last Year: Never true    Ran Out of Food in the Last Year: Never true  Transportation Needs: No Transportation Needs (03/14/2021)   PRAPARE - Hydrologist (Medical): No    Lack of Transportation  (Non-Medical): No  Physical Activity: Insufficiently Active (03/14/2021)   Exercise Vital Sign    Days of Exercise per Week: 3 days    Minutes of Exercise per Session: 30 min  Stress: No Stress Concern Present (03/14/2021)   [place]    Feeling of Stress : Not at all  Social Connections: [place] (03/14/2021)   Social Connection and Isolation Panel [NHANES]    Frequency of Communication with Friends and Family: More than three times a week    Frequency of Social Gatherings with Friends and Family: Twice a week    Attends Religious Services: More than 4 times per year    Active Member of [employer] or Organizations: Yes    Attends Music therapist: More than 4 times per year    Marital Status: Married  Human resources officer Violence: Not At Risk (03/14/2021)   Humiliation, Afraid, Rape, and Kick questionnaire    Fear of Current or Ex-Partner: No    Emotionally Abused: No    Physically Abused: No    Sexually Abused: No    Outpatient Encounter Medications as of 02/16/2022  Medication Sig   doxycycline (VIBRA-TABS) 100 MG tablet Take 1 tablet (100 mg total) by mouth 2 (two) times daily for 10 days. 1 po bid   predniSONE (DELTASONE) 20 MG tablet Take 2 tablets (40 mg total) by mouth daily with breakfast for 5 days. 2 po daily for 5 days   albuterol (VENTOLIN HFA) 108 (90 Base) MCG/ACT inhaler Inhale 2 puffs into the lungs every 6 (six) hours as needed.   atorvastatin (LIPITOR) 40 MG tablet Take 1 tablet (40 mg total) by mouth at bedtime.   diclofenac Sodium (VOLTAREN) 1 % GEL APPLY 2 GRAMS TO AFFECTED AREA 4 TIMES A DAY   fexofenadine (ALLEGRA ALLERGY) 180 MG tablet Take 1 tablet (180 mg total) by mouth daily.   fluticasone (FLONASE) 50 MCG/ACT nasal spray 1 spray in each nostril   gabapentin (NEURONTIN) 300 MG capsule TAKE 1 CAPSULE BY MOUTH 3 TIMES  DAILY   hyoscyamine (LEVSIN) 0.125 MG tablet Take 1 tablet  (0.125 mg total) by mouth every 4 (four) hours as needed for cramping.   lisinopril (ZESTRIL) 5 MG tablet Take 2.5 mg by mouth daily.   loperamide (IMODIUM) 2 MG capsule TAKE 1 CAPSULE BY MOUTH 4 TIMES  DAILY AS NEEDED FOR DIARRHEA OR  LOOSE STOOLS   Magnesium 200 MG TABS Take 200 mg by mouth daily.   Magnesium 200 MG TABS Take 1 tablet (200 mg total) by mouth daily.   metoprolol succinate (TOPROL-XL) 25 MG 24 hr tablet TAKE 1/2 TABLET BY MOUTH EVERY DAY   montelukast (SINGULAIR) 10 MG tablet Take 1 tablet (10 mg total) by mouth daily.   omeprazole (PRILOSEC) 40 MG capsule Take 1 capsule (40 mg total) by mouth daily.   ondansetron (ZOFRAN) 4 MG tablet Take 1 tablet (4 mg total) by mouth every 8 (eight) hours as needed for nausea or vomiting.   Vitamin D, Ergocalciferol, (  DRISDOL) 1.25 MG (50000 UNIT) CAPS capsule Take 1 capsule (50,000 Units total) by mouth every 7 (seven) days.   No facility-administered encounter medications on file as of 02/16/2022.    Allergies  Allergen Reactions   Hctz [Hydrochlorothiazide] Other (See Comments)    cramping    Review of Systems  Constitutional:  Negative for activity change, appetite change, chills, diaphoresis, fatigue, fever and unexpected weight change.  HENT:  Positive for congestion, postnasal drip, rhinorrhea, sinus pressure and sinus pain. Negative for dental problem, drooling, ear discharge, ear pain, facial swelling, hearing loss, hoarse voice, mouth sores, nosebleeds, sneezing, sore throat, tinnitus, trouble swallowing and voice change.   Respiratory:  Negative for cough and shortness of breath.   Cardiovascular:  Negative for chest pain, palpitations and leg swelling.  Gastrointestinal:  Negative for abdominal pain.  Genitourinary:  Negative for decreased urine volume and difficulty urinating.  Musculoskeletal:  Negative for arthralgias, myalgias and neck pain.  Neurological:  Positive for headaches. Negative for dizziness, tremors,  seizures, syncope, facial asymmetry, speech difficulty, weakness, light-headedness and numbness.  Psychiatric/Behavioral:  Negative for confusion.   All other systems reviewed and are negative.        Observations/Objective: No vital signs or physical exam, this was a virtual health encounter.  Pt alert and oriented, answers all questions appropriately, and able to speak in full sentences.    Assessment and Plan: Nicole was seen today for sinus problem.  Diagnoses and all orders for this visit:  Acute non-recurrent pansinusitis Treated with Augmentin without complete resolution of symptoms. Has been taking Mucinex, Flonase, Allegra, and Singulair as prescribed. Will burst with steroids and treat with longer course of antibiotics. If symptoms persist, will refer to ENT.  -     doxycycline (VIBRA-TABS) 100 MG tablet; Take 1 tablet (100 mg total) by mouth 2 (two) times daily for 10 days. 1 po bid -     predniSONE (DELTASONE) 20 MG tablet; Take 2 tablets (40 mg total) by mouth daily with breakfast for 5 days. 2 po daily for 5 days     Follow Up Instructions: Return if symptoms worsen or fail to improve.    I discussed the assessment and treatment plan with the patient. The patient was provided an opportunity to ask questions and all were answered. The patient agreed with the plan and demonstrated an understanding of the instructions.   The patient was advised to call back or seek an in-person evaluation if the symptoms worsen or if the condition fails to improve as anticipated.  The above assessment and management plan was discussed with the patient. The patient verbalized understanding of and has agreed to the management plan. Patient is aware to call the clinic if they develop any new symptoms or if symptoms persist or worsen. Patient is aware when to return to the clinic for a follow-up visit. Patient educated on when it is appropriate to go to the emergency department.    I  provided 13 minutes of time during this telephone encounter.   Monia Pouch, FNP-C [facility] [address] [place], [place] [address] [phone] 02/16/2022

## 2022-02-21 ENCOUNTER — Telehealth: Payer: Self-pay | Admitting: Family Medicine

## 2022-02-22 ENCOUNTER — Encounter: Payer: Self-pay | Admitting: Family Medicine

## 2022-02-22 ENCOUNTER — Ambulatory Visit (INDEPENDENT_AMBULATORY_CARE_PROVIDER_SITE_OTHER): Payer: Medicare Other | Admitting: Family Medicine

## 2022-02-22 VITALS — BP 147/80 | HR 57 | Temp 97.6°F | Ht 59.0 in | Wt 159.8 lb

## 2022-02-22 DIAGNOSIS — J0100 Acute maxillary sinusitis, unspecified: Secondary | ICD-10-CM | POA: Diagnosis not present

## 2022-02-22 MED ORDER — PREDNISONE 10 MG PO TABS: ORAL_TABLET | ORAL | 0 refills | Status: DC

## 2022-02-22 MED ORDER — MOXIFLOXACIN HCL 400 MG PO TABS: 400.0000 mg | ORAL_TABLET | Freq: Every day | ORAL | 0 refills | Status: DC

## 2022-02-22 NOTE — Progress Notes (Signed)
Chief Complaint  Patient presents with   Sinusitis    HPI  Patient presents today for Symptoms include congestion, facial pain, nasal congestion, non productive cough, post nasal drip and sinus pressure. There is no fever, chills, or sweats. Onset of symptoms was a few days ago, gradually worsening since that time. Took amoxicillin without relief. Prednisone caused copious drainage. Now taking doxycycline. Been on it for a week. Not much better. Cough in chest, congested.    PMH: Smoking status noted ROS: Per HPI  Objective: BP (!) 147/80   Pulse (!) 57   Temp 97.6 F (36.4 C)   Ht '4\' 11"'$  (1.499 m)   Wt 159 lb 12.8 oz (72.5 kg)   SpO2 96%   BMI 32.28 kg/m  Gen: NAD, alert, cooperative with exam HEENT: NCAT, EOMI, PERRL. Tms clear. Max sinuses tender. Pharynx red with drainage. Nasal passages erythematous CV: RRR, good S1/S2, no murmur Resp: CTABL, no wheezes, non-labored Abd: SNTND, BS present, no guarding or organomegaly Ext: No edema, warm Neuro: Alert and oriented, No gross deficits  Assessment and plan:  1. Acute maxillary sinusitis, recurrence not specified     Meds ordered this encounter  Medications   predniSONE (DELTASONE) 10 MG tablet    Sig: Take 5 daily for 2 days followed by 4,3,2 and 1 for 2 days each.    Dispense:  30 tablet    Refill:  0   moxifloxacin (AVELOX) 400 MG tablet    Sig: Take 1 tablet (400 mg total) by mouth daily.    Dispense:  10 tablet    Refill:  0    No orders of the defined types were placed in this encounter.   Follow up as needed.  Claretta Fraise, MD

## 2022-03-07 DIAGNOSIS — H9311 Tinnitus, right ear: Secondary | ICD-10-CM | POA: Diagnosis not present

## 2022-03-07 DIAGNOSIS — J329 Chronic sinusitis, unspecified: Secondary | ICD-10-CM | POA: Diagnosis not present

## 2022-03-07 DIAGNOSIS — J309 Allergic rhinitis, unspecified: Secondary | ICD-10-CM | POA: Diagnosis not present

## 2022-03-08 ENCOUNTER — Telehealth: Payer: Self-pay | Admitting: Family Medicine

## 2022-03-09 ENCOUNTER — Telehealth (INDEPENDENT_AMBULATORY_CARE_PROVIDER_SITE_OTHER): Payer: Medicare Other | Admitting: Family Medicine

## 2022-03-09 ENCOUNTER — Encounter: Payer: Self-pay | Admitting: Family Medicine

## 2022-03-09 DIAGNOSIS — J309 Allergic rhinitis, unspecified: Secondary | ICD-10-CM

## 2022-03-09 MED ORDER — PSEUDOEPH-BROMPHEN-DM 30-2-10 MG/5ML PO SYRP: 5.0000 mL | ORAL_SOLUTION | Freq: Four times a day (QID) | ORAL | 0 refills | Status: DC | PRN

## 2022-03-09 NOTE — Progress Notes (Signed)
Virtual Visit  Note Due to COVID-19 pandemic this visit was conducted virtually. This visit type was conducted due to national recommendations for restrictions regarding the COVID-19 Pandemic (e.g. social distancing, sheltering in place) in an effort to limit this patient's exposure and mitigate transmission in our community. All issues noted in this document were discussed and addressed.  A physical exam was not performed with this format.  I connected with [name] on 03/09/22 at 1154 by telephone and verified that I am speaking with the correct person using two identifiers. Nicole Aguirre is currently located at home and no one is currently with her during the visit. The provider, Gwenlyn Perking, FNP is located in their office at time of visit.  I discussed the limitations, risks, security and privacy concerns of performing an evaluation and management service by telephone and the availability of in person appointments. I also discussed with the patient that there may be a patient responsible charge related to this service. The patient expressed understanding and agreed to proceed.  CC: cough  History and Present Illness:  Cough This is a new problem. The current episode started in the past 7 days. The problem has been unchanged. Associated symptoms include ear congestion, headaches, nasal congestion, postnasal drip, a sore throat and wheezing (2x a day- relieved with inhaler). Pertinent negatives include no chest pain, chills or ear pain. The symptoms are aggravated by pollens and dust. She has tried oral steroids, prescription cough suppressant and a beta-agonist inhaler (amoxicillin, doxycyline, moxifloxicin) for the symptoms. The treatment provided mild relief. Her past medical history is significant for environmental allergies. There is no history of asthma, bronchiectasis, bronchitis, COPD, emphysema or pneumonia.   She had a consult with allergy 2 days ago. She has a  follow up appt next month for allergy testing.    Review of Systems  Constitutional:  Negative for chills.  HENT:  Positive for postnasal drip and sore throat. Negative for ear pain.   Respiratory:  Positive for cough and wheezing (2x a day- relieved with inhaler).   Cardiovascular:  Negative for chest pain.  Neurological:  Positive for headaches.  Endo/Heme/Allergies:  Positive for environmental allergies.     Observations/Objective: Discussed symptomatic care and return precautions.   Assessment and Plan: Nicole was seen today for cough.  Diagnoses and all orders for this visit:  Chronic allergic rhinitis Not well controlled. Continue allegra, fonase, singular. Continue albuterol prn. Continue follow up with allergy. Can use bromphed prn. She has completed 2 rounds of prednisone and 3 abx in the last month.  -     brompheniramine-pseudoephedrine-DM 30-2-10 MG/5ML syrup; Take 5 mLs by mouth 4 (four) times daily as needed.   Follow Up Instructions: Return to office for new or worsening symptoms.    I discussed the assessment and treatment plan with the patient. The patient was provided an opportunity to ask questions and all were answered. The patient agreed with the plan and demonstrated an understanding of the instructions.   The patient was advised to call back or seek an in-person evaluation if the symptoms worsen or if the condition fails to improve as anticipated.  The above assessment and management plan was discussed with the patient. The patient verbalized understanding of and has agreed to the management plan. Patient is aware to call the clinic if symptoms persist or worsen. Patient is aware when to return to the clinic for a follow-up visit. Patient educated on when it is appropriate to go  to the emergency department.   Time call ended:  1206  I provided 12 minutes of  non face-to-face time during this encounter.    Gwenlyn Perking, FNP

## 2022-03-13 ENCOUNTER — Telehealth: Payer: Self-pay | Admitting: Family Medicine

## 2022-03-13 NOTE — Telephone Encounter (Signed)
Patient aware and verbalizes understanding. 

## 2022-03-13 NOTE — Telephone Encounter (Signed)
Pt says that brompheniramine-pseudoephedrine-DM 30-2-10 MG/5ML syrup  is on backorder at [facility]. Pt asking if another rx similar can be called in? Plaese call back

## 2022-03-13 NOTE — Telephone Encounter (Signed)
She can try delsym OTC. There isn't really another RX option.

## 2022-03-15 ENCOUNTER — Other Ambulatory Visit: Payer: Self-pay | Admitting: Family Medicine

## 2022-03-15 ENCOUNTER — Telehealth: Payer: Self-pay | Admitting: Family Medicine

## 2022-03-15 MED ORDER — FLUTICASONE PROPIONATE 50 MCG/ACT NA SUSP: 2.0000 | Freq: Every day | NASAL | 6 refills | Status: DC

## 2022-03-15 NOTE — Telephone Encounter (Signed)
Contacted [place] to schedule their annual wellness visit. Appointment made for 03/30/2022.  Thank you,  Colletta [place],  [facility] Direct Dial ??[phone]

## 2022-03-29 ENCOUNTER — Other Ambulatory Visit: Payer: Self-pay | Admitting: Family Medicine

## 2022-03-29 ENCOUNTER — Telehealth: Payer: Self-pay | Admitting: Family Medicine

## 2022-03-29 DIAGNOSIS — E559 Vitamin D deficiency, unspecified: Secondary | ICD-10-CM

## 2022-03-29 DIAGNOSIS — M797 Fibromyalgia: Secondary | ICD-10-CM

## 2022-03-29 DIAGNOSIS — K582 Mixed irritable bowel syndrome: Secondary | ICD-10-CM

## 2022-03-29 NOTE — Telephone Encounter (Signed)
Pt will check back with [facility] pharmacy, we sent in 100-d supply on 01/04/22 with a refill, if they do not have she will let us know and we will send one 100-d supply into pharmacy to get her to her appt.

## 2022-03-29 NOTE — Telephone Encounter (Signed)
Pt called [facility] and her gabapentin (NEURONTIN) 300 MG capsule  was denied.  Prescription Request  03/29/2022  Is this a "Controlled Substance" medicine? gabapentin (NEURONTIN) 300 MG capsule   Have you seen your PCP in the last 2 weeks? 6/11  If YES, route message to pool  -  If NO, patient needs to be scheduled for appointment.  What is the name of the medication or equipment? gabapentin (NEURONTIN) 300 MG capsule   Have you contacted your pharmacy to request a refill? yes   Which pharmacy would you like this sent to? [facility]   Patient notified that their request is being sent to the clinical staff for review and that they should receive a response within 2 business days.

## 2022-03-30 ENCOUNTER — Telehealth: Payer: Self-pay | Admitting: Family Medicine

## 2022-03-30 ENCOUNTER — Ambulatory Visit (INDEPENDENT_AMBULATORY_CARE_PROVIDER_SITE_OTHER): Payer: Medicare Other

## 2022-03-30 VITALS — Ht 59.0 in | Wt 159.0 lb

## 2022-03-30 DIAGNOSIS — Z0000 Encounter for general adult medical examination without abnormal findings: Secondary | ICD-10-CM | POA: Diagnosis not present

## 2022-03-30 DIAGNOSIS — M797 Fibromyalgia: Secondary | ICD-10-CM

## 2022-03-30 DIAGNOSIS — Z1231 Encounter for screening mammogram for malignant neoplasm of breast: Secondary | ICD-10-CM

## 2022-03-30 DIAGNOSIS — Z780 Asymptomatic menopausal state: Secondary | ICD-10-CM

## 2022-03-30 MED ORDER — GABAPENTIN 300 MG PO CAPS: 300.0000 mg | ORAL_CAPSULE | Freq: Three times a day (TID) | ORAL | 0 refills | Status: DC

## 2022-03-30 NOTE — Telephone Encounter (Signed)
Pt aware refill sent to [facility]

## 2022-03-30 NOTE — Patient Instructions (Signed)
Nicole Aguirre , Thank you for taking time to come for your Medicare Wellness Visit. I appreciate your ongoing commitment to your health goals. Please review the following plan we discussed and let me know if I can assist you in the future.   These are the goals we discussed:  Goals      AWV     03/11/2020 AWV Goal: Exercise for General Health  Patient will verbalize understanding of the benefits of increased physical activity: Exercising regularly is important. It will improve your overall fitness, flexibility, and endurance. Regular exercise also will improve your overall health. It can help you control your weight, reduce stress, and improve your bone density. Over the next year, patient will increase physical activity as tolerated with a goal of at least 150 minutes of moderate physical activity per week.  You can tell that you are exercising at a moderate intensity if your heart starts beating faster and you start breathing faster but can still hold a conversation. Moderate-intensity exercise ideas include: Walking 1 mile (1.6 km) in about 15 minutes Biking Hiking Golfing Dancing Water aerobics Patient will verbalize understanding of everyday activities that increase physical activity by providing examples like the following: Yard work, such as: Sales promotion account executive Gardening Washing windows or floors Patient will be able to explain general safety guidelines for exercising:  Before you start a new exercise program, talk with your health care provider. Do not exercise so much that you hurt yourself, feel dizzy, or get very short of breath. Wear comfortable clothes and wear shoes with good support. Drink plenty of water while you exercise to prevent dehydration or heat stroke. Work out until your breathing and your heartbeat get faster.      DIET - INCREASE WATER INTAKE     Try to drink 6-8 glasses  of water daily.        This is a list of the screening recommended for you and due dates:  Health Maintenance  Topic Date Due   Zoster (Shingles) Vaccine (1 of 2) Never done   DEXA scan (bone density measurement)  02/16/2022   Flu Shot  04/16/2022*   Pneumonia Vaccine (2 of 2 - PPSV23 or PCV20) 12/20/2022*   COVID-19 Vaccine (6 - 2023-24 season) 01/05/2023*   Mammogram  04/21/2022   Medicare Annual Wellness Visit  03/30/2023   Colon Cancer Screening  04/29/2025   Hepatitis C Screening: USPSTF Recommendation to screen - Ages 18-79 yo.  Completed   HPV Vaccine  Aged Out   DTaP/Tdap/Td vaccine  Discontinued  *Topic was postponed. The date shown is not the original due date.    Advanced directives: Advance directive discussed with you today. I have provided a copy for you to complete at home and have notarized. Once this is complete please bring a copy in to our office so we can scan it into your chart.   Conditions/risks identified: Aim for 30 minutes of exercise or brisk walking, 6-8 glasses of water, and 5 servings of fruits and vegetables each day.   Next appointment: Follow up in one year for your annual wellness visit    Preventive Care 65 Years and Older, Female Preventive care refers to lifestyle choices and visits with your health care provider that can promote health and wellness. What does preventive care include? A yearly physical exam. This is also called an annual well check. Dental exams once or twice a  year. Routine eye exams. Ask your health care provider how often you should have your eyes checked. Personal lifestyle choices, including: Daily care of your teeth and gums. Regular physical activity. Eating a healthy diet. Avoiding tobacco and drug use. Limiting alcohol use. Practicing safe sex. Taking low-dose aspirin every day. Taking vitamin and mineral supplements as recommended by your health care provider. What happens during an annual well check? The  services and screenings done by your health care provider during your annual well check will depend on your age, overall health, lifestyle risk factors, and family history of disease. Counseling  Your health care provider may ask you questions about your: Alcohol use. Tobacco use. Drug use. Emotional well-being. Home and relationship well-being. Sexual activity. Eating habits. History of falls. Memory and ability to understand (cognition). Work and work Statistician. Reproductive health. Screening  You may have the following tests or measurements: Height, weight, and BMI. Blood pressure. Lipid and cholesterol levels. These may be checked every 5 years, or more frequently if you are over 69 years old. Skin check. Lung cancer screening. You may have this screening every year starting at age 41 if you have a 30-pack-year history of smoking and currently smoke or have quit within the past 15 years. Fecal occult blood test (FOBT) of the stool. You may have this test every year starting at age 1. Flexible sigmoidoscopy or colonoscopy. You may have a sigmoidoscopy every 5 years or a colonoscopy every 10 years starting at age 92. Hepatitis C blood test. Hepatitis B blood test. Sexually transmitted disease (STD) testing. Diabetes screening. This is done by checking your blood sugar (glucose) after you have not eaten for a while (fasting). You may have this done every 1-3 years. Bone density scan. This is done to screen for osteoporosis. You may have this done starting at age 74. Mammogram. This may be done every 1-2 years. Talk to your health care provider about how often you should have regular mammograms. Talk with your health care provider about your test results, treatment options, and if necessary, the need for more tests. Vaccines  Your health care provider may recommend certain vaccines, such as: Influenza vaccine. This is recommended every year. Tetanus, diphtheria, and acellular  pertussis (Tdap, Td) vaccine. You may need a Td booster every 10 years. Zoster vaccine. You may need this after age 35. Pneumococcal 13-valent conjugate (PCV13) vaccine. One dose is recommended after age 71. Pneumococcal polysaccharide (PPSV23) vaccine. One dose is recommended after age 54. Talk to your health care provider about which screenings and vaccines you need and how often you need them. This information is not intended to replace advice given to you by your health care provider. Make sure you discuss any questions you have with your health care provider. Document Released: 01/29/2015 Document Revised: 09/22/2015 Document Reviewed: 11/03/2014 Elsevier Interactive Patient Education  2017 [place] Prevention in the Home Falls can cause injuries. They can happen to people of all ages. There are many things you can do to make your home safe and to help prevent falls. What can I do on the outside of my home? Regularly fix the edges of walkways and driveways and fix any cracks. Remove anything that might make you trip as you walk through a door, such as a raised step or threshold. Trim any bushes or trees on the path to your home. Use bright outdoor lighting. Clear any walking paths of anything that might make someone trip, such as rocks or  tools. Regularly check to see if handrails are loose or broken. Make sure that both sides of any steps have handrails. Any raised decks and porches should have guardrails on the edges. Have any leaves, snow, or ice cleared regularly. Use sand or salt on walking paths during winter. Clean up any spills in your garage right away. This includes oil or grease spills. What can I do in the bathroom? Use night lights. Install grab bars by the toilet and in the tub and shower. Do not use towel bars as grab bars. Use non-skid mats or decals in the tub or shower. If you need to sit down in the shower, use a plastic, non-slip stool. Keep the floor  dry. Clean up any water that spills on the floor as soon as it happens. Remove soap buildup in the tub or shower regularly. Attach bath mats securely with double-sided non-slip rug tape. Do not have throw rugs and other things on the floor that can make you trip. What can I do in the bedroom? Use night lights. Make sure that you have a light by your bed that is easy to reach. Do not use any sheets or blankets that are too big for your bed. They should not hang down onto the floor. Have a firm chair that has side arms. You can use this for support while you get dressed. Do not have throw rugs and other things on the floor that can make you trip. What can I do in the kitchen? Clean up any spills right away. Avoid walking on wet floors. Keep items that you use a lot in easy-to-reach places. If you need to reach something above you, use a strong step stool that has a grab bar. Keep electrical cords out of the way. Do not use floor polish or wax that makes floors slippery. If you must use wax, use non-skid floor wax. Do not have throw rugs and other things on the floor that can make you trip. What can I do with my stairs? Do not leave any items on the stairs. Make sure that there are handrails on both sides of the stairs and use them. Fix handrails that are broken or loose. Make sure that handrails are as long as the stairways. Check any carpeting to make sure that it is firmly attached to the stairs. Fix any carpet that is loose or worn. Avoid having throw rugs at the top or bottom of the stairs. If you do have throw rugs, attach them to the floor with carpet tape. Make sure that you have a light switch at the top of the stairs and the bottom of the stairs. If you do not have them, ask someone to add them for you. What else can I do to help prevent falls? Wear shoes that: Do not have high heels. Have rubber bottoms. Are comfortable and fit you well. Are closed at the toe. Do not wear  sandals. If you use a stepladder: Make sure that it is fully opened. Do not climb a closed stepladder. Make sure that both sides of the stepladder are locked into place. Ask someone to hold it for you, if possible. Clearly mark and make sure that you can see: Any grab bars or handrails. First and last steps. Where the edge of each step is. Use tools that help you move around (mobility aids) if they are needed. These include: Canes. Walkers. Scooters. Crutches. Turn on the lights when you go into a dark area. Replace  any light bulbs as soon as they burn out. Set up your furniture so you have a clear path. Avoid moving your furniture around. If any of your floors are uneven, fix them. If there are any pets around you, be aware of where they are. Review your medicines with your doctor. Some medicines can make you feel dizzy. This can increase your chance of falling. Ask your doctor what other things that you can do to help prevent falls. This information is not intended to replace advice given to you by your health care provider. Make sure you discuss any questions you have with your health care provider. Document Released: 10/29/2008 Document Revised: 06/10/2015 Document Reviewed: 02/06/2014 Elsevier Interactive Patient Education  2017 [employer].

## 2022-03-30 NOTE — Progress Notes (Signed)
Subjective:   Nicole Aguirre is a 72 y.o. female who presents for Medicare Annual (Subsequent) preventive examination. I connected with  [name] on 03/30/22 by a audio enabled telemedicine application and verified that I am speaking with the correct person using two identifiers.  Patient Location: Home  Provider Location: Home Office  I discussed the limitations of evaluation and management by telemedicine. The patient expressed understanding and agreed to proceed.  Review of Systems     Cardiac Risk Factors include: advanced age (>2mn, >>89women);hypertension     Objective:    Today's Vitals   03/30/22 0852  Weight: 159 lb (72.1 kg)  Height: '4\' 11"'$  (1.499 m)   Body mass index is 32.11 kg/m.     03/30/2022    8:55 AM 03/14/2021    9:58 AM 03/11/2020    9:48 AM 07/26/2018    3:12 PM  Advanced Directives  Does Patient Have a Medical Advance Directive? No No No No  Would patient like information on creating a medical advance directive? No - Patient declined No - Patient declined No - Patient declined No - Patient declined    Current Medications (verified) Outpatient Encounter Medications as of 03/30/2022  Medication Sig   albuterol (VENTOLIN HFA) 108 (90 Base) MCG/ACT inhaler Inhale 2 puffs into the lungs every 6 (six) hours as needed.   atorvastatin (LIPITOR) 40 MG tablet Take 1 tablet (40 mg total) by mouth at bedtime.   brompheniramine-pseudoephedrine-DM 30-2-10 MG/5ML syrup Take 5 mLs by mouth 4 (four) times daily as needed.   diclofenac Sodium (VOLTAREN) 1 % GEL APPLY 2 GRAMS TO AFFECTED AREA 4 TIMES A DAY   fexofenadine (ALLEGRA ALLERGY) 180 MG tablet Take 1 tablet (180 mg total) by mouth daily.   fluticasone (FLONASE) 50 MCG/ACT nasal spray Place 2 sprays into both nostrils daily.   gabapentin (NEURONTIN) 300 MG capsule TAKE 1 CAPSULE BY MOUTH 3 TIMES  DAILY   hyoscyamine (LEVSIN) 0.125 MG tablet Take 1 tablet (0.125 mg total) by mouth every 4 (four)  hours as needed for cramping.   lisinopril (ZESTRIL) 5 MG tablet Take 2.5 mg by mouth daily.   loperamide (IMODIUM) 2 MG capsule TAKE 1 CAPSULE BY MOUTH 4 TIMES  DAILY AS NEEDED FOR DIARRHEA OR  LOOSE STOOLS   Magnesium 200 MG TABS Take 200 mg by mouth daily.   Magnesium 200 MG TABS Take 1 tablet (200 mg total) by mouth daily.   metoprolol succinate (TOPROL-XL) 25 MG 24 hr tablet TAKE 1/2 TABLET BY MOUTH EVERY DAY   montelukast (SINGULAIR) 10 MG tablet Take 1 tablet (10 mg total) by mouth daily.   omeprazole (PRILOSEC) 40 MG capsule Take 1 capsule (40 mg total) by mouth daily.   ondansetron (ZOFRAN) 4 MG tablet Take 1 tablet (4 mg total) by mouth every 8 (eight) hours as needed for nausea or vomiting.   Vitamin D, Ergocalciferol, (DRISDOL) 1.25 MG (50000 UNIT) CAPS capsule Take 1 capsule (50,000 Units total) by mouth every 7 (seven) days.   moxifloxacin (AVELOX) 400 MG tablet Take 1 tablet (400 mg total) by mouth daily. (Patient not taking: Reported on 03/30/2022)   predniSONE (DELTASONE) 10 MG tablet Take 5 daily for 2 days followed by 4,3,2 and 1 for 2 days each. (Patient not taking: Reported on 03/30/2022)   No facility-administered encounter medications on file as of 03/30/2022.    Allergies (verified) Hctz [hydrochlorothiazide]   History: Past Medical History:  Diagnosis Date   Allergy  Fibromyalgia    GERD (gastroesophageal reflux disease)    Hyperlipidemia    Hypertension    PVC (premature ventricular contraction)    Past Surgical History:  Procedure Laterality Date   ABDOMINAL HYSTERECTOMY     APPENDECTOMY  1985   CATARACT EXTRACTION, BILATERAL     CHOLECYSTECTOMY  2010   Family History  Problem Relation Age of Onset   Lupus Mother    Rheum arthritis Mother    Lung cancer Mother    Emphysema Father    Pancreatic cancer Father    Lupus Sister    Lupus Sister    Lung disease Sister    Heart disease Brother    Early death Brother        Died from falling from a  tree   Heart disease Son    Mental illness Son    Breast cancer Neg Hx    Social History   Socioeconomic History   Marital status: Married    Spouse name: [name]   Number of children: 2   Years of education: 10th   Highest education level: GED or equivalent  Occupational History   Occupation: Dietary    Comment: Adult nurse   Occupation: retired  Tobacco Use   Smoking status: Former    Packs/day: 1.00    Years: 4.00    Additional pack years: 0.00    Total pack years: 4.00    Types: Cigarettes    Quit date: 07/25/1976    Years since quitting: 45.7   Smokeless tobacco: Never  Vaping Use   Vaping Use: Never used  Substance and Sexual Activity   Alcohol use: Never   Drug use: Never   Sexual activity: Yes    Birth control/protection: Surgical  Other Topics Concern   Not on file  Social History Narrative   2 sons   5 grandchildren.   Social Determinants of Health   Financial Resource Strain: Low Risk  (03/30/2022)   Overall Financial Resource Strain (CARDIA)    Difficulty of Paying Living Expenses: Not hard at all  Food Insecurity: No Food Insecurity (03/30/2022)   Hunger Vital Sign    Worried About Running Out of Food in the Last Year: Never true    Ran Out of Food in the Last Year: Never true  Transportation Needs: No Transportation Needs (03/30/2022)   PRAPARE - Hydrologist (Medical): No    Lack of Transportation (Non-Medical): No  Physical Activity: Insufficiently Active (03/30/2022)   Exercise Vital Sign    Days of Exercise per Week: 3 days    Minutes of Exercise per Session: 30 min  Stress: No Stress Concern Present (03/30/2022)   [place]    Feeling of Stress : Not at all  Social Connections: Moderately Integrated (03/30/2022)   Social Connection and Isolation Panel [NHANES]    Frequency of Communication with Friends and Family: More than three times a week     Frequency of Social Gatherings with Friends and Family: More than three times a week    Attends Religious Services: More than 4 times per year    Active Member of [employer] or Organizations: No    Attends Archivist Meetings: Never    Marital Status: Married    Tobacco Counseling Counseling given: Not Answered   Clinical Intake:  Pre-visit preparation completed: Yes  Pain : No/denies pain     Nutritional Risks: None Diabetes: No  How often do you need to have someone help you when you read instructions, pamphlets, or other written materials from your doctor or pharmacy?: 1 - Never  Diabetic?no   Interpreter Needed?: No  Information entered by :: Jadene Pierini, LPN   Activities of Daily Living    03/30/2022    8:55 AM  In your present state of health, do you have any difficulty performing the following activities:  Hearing? 0  Vision? 0  Difficulty concentrating or making decisions? 0  Walking or climbing stairs? 0  Dressing or bathing? 0  Doing errands, shopping? 0  Preparing Food and eating ? N  Using the Toilet? N  In the past six months, have you accidently leaked urine? N  Do you have problems with loss of bowel control? N  Managing your Medications? N  Managing your Finances? N  Housekeeping or managing your Housekeeping? N    Patient Care Team: Baruch Gouty, FNP as PCP - General (Family Medicine)  Indicate any recent Medical Services you may have received from other than [facility] providers in the past year (date may be approximate).     Assessment:   This is a routine wellness examination for [place].  Hearing/Vision screen Vision Screening - Comments:: Wears rx glasses - up to date with routine eye exams with  Dr.Lee  Dietary issues and exercise activities discussed: Current Exercise Habits: Home exercise routine, Type of exercise: walking, Time (Minutes): 30, Frequency (Times/Week): 3, Weekly Exercise (Minutes/Week): 90, Intensity: Mild,  Exercise limited by: None identified   Goals Addressed             This Visit's Progress    AWV   On track    03/11/2020 AWV Goal: Exercise for General Health  Patient will verbalize understanding of the benefits of increased physical activity: Exercising regularly is important. It will improve your overall fitness, flexibility, and endurance. Regular exercise also will improve your overall health. It can help you control your weight, reduce stress, and improve your bone density. Over the next year, patient will increase physical activity as tolerated with a goal of at least 150 minutes of moderate physical activity per week.  You can tell that you are exercising at a moderate intensity if your heart starts beating faster and you start breathing faster but can still hold a conversation. Moderate-intensity exercise ideas include: Walking 1 mile (1.6 km) in about 15 minutes Biking Hiking Golfing Dancing Water aerobics Patient will verbalize understanding of everyday activities that increase physical activity by providing examples like the following: Yard work, such as: Sales promotion account executive Gardening Washing windows or floors Patient will be able to explain general safety guidelines for exercising:  Before you start a new exercise program, talk with your health care provider. Do not exercise so much that you hurt yourself, feel dizzy, or get very short of breath. Wear comfortable clothes and wear shoes with good support. Drink plenty of water while you exercise to prevent dehydration or heat stroke. Work out until your breathing and your heartbeat get faster.        Depression Screen    03/30/2022    8:54 AM 02/22/2022   11:42 AM 01/31/2022    9:11 AM 12/19/2021    1:04 PM 09/15/2021    3:12 PM 04/20/2021   10:09 AM 04/06/2021    9:52 AM  PHQ 2/9 Scores  PHQ - 2 Score 0 0  0 0 0 0 0  PHQ- 9 Score     0  0 0    Fall Risk    03/30/2022    8:52 AM 02/22/2022   11:42 AM 01/31/2022    9:11 AM 12/19/2021    1:04 PM 09/15/2021    3:12 PM  [place] in the past year? 0 0 0 0 0  Number falls in past yr: 0      Injury with Fall? 0      Risk for fall due to : No Fall Risks      Follow up Falls prevention discussed        [place]:  Any stairs in or around the home? No  If so, are there any without handrails? No  Home free of loose throw rugs in walkways, pet beds, electrical cords, etc? Yes  Adequate lighting in your home to reduce risk of falls? Yes   ASSISTIVE DEVICES UTILIZED TO PREVENT FALLS:  Life alert? No  Use of a cane, walker or w/c? No  Grab bars in the bathroom? No  Shower chair or bench in shower? No  Elevated toilet seat or a handicapped toilet? No          03/30/2022    8:55 AM 03/11/2020    9:51 AM 07/26/2018    3:16 PM  6CIT Screen  What Year? 0 points 0 points 0 points  What month? 0 points 0 points 0 points  What time? 0 points 0 points 0 points  Count back from 20 0 points 0 points 0 points  Months in reverse 0 points 0 points 2 points  Repeat phrase 0 points 0 points 0 points  Total Score 0 points 0 points 2 points    Immunizations Immunization History  Administered Date(s) Administered   Fluad Quad(high Dose 65+) 10/24/2019, 11/08/2020   Influenza, High Dose Seasonal PF 10/09/2017   Influenza,inj,Quad PF,6+ Mos 11/15/2016   Moderna Covid-19 Vaccine Bivalent Booster 31yr & up 02/24/2021   Moderna Sars-Covid-2 Vaccination 01/21/2019, 02/18/2019, 11/20/2019, 08/05/2020   Pneumococcal Conjugate-13 02/13/2017, 02/17/2020   Tdap 11/22/2010    TDAP status: Due, Education has been provided regarding the importance of this vaccine. Advised may receive this vaccine at local pharmacy or Health Dept. Aware to provide a copy of the vaccination record if obtained from local pharmacy or Health Dept. Verbalized acceptance  and understanding.  Flu Vaccine status: Up to date  Pneumococcal vaccine status: Up to date  Covid-19 vaccine status: Completed vaccines  Qualifies for Shingles Vaccine? Yes   Zostavax completed No   Shingrix Completed?: No.    Education has been provided regarding the importance of this vaccine. Patient has been advised to call insurance company to determine out of pocket expense if they have not yet received this vaccine. Advised may also receive vaccine at local pharmacy or Health Dept. Verbalized acceptance and understanding.  Screening Tests Health Maintenance  Topic Date Due   Zoster Vaccines- Shingrix (1 of 2) Never done   DEXA SCAN  02/16/2022   INFLUENZA VACCINE  04/16/2022 (Originally 08/16/2021)   Pneumonia Vaccine 71 Years old (2 of 2 - PPSV23 or PCV20) 12/20/2022 (Originally 02/16/2021)   COVID-19 Vaccine (6 - 2023-24 season) 01/05/2023 (Originally 09/16/2021)   MAMMOGRAM  04/21/2022   Medicare Annual Wellness (AWV)  03/30/2023   COLONOSCOPY (Pts 45-466yrInsurance coverage will need to be confirmed)  04/29/2025   Hepatitis C Screening  Completed   HPV VACCINES  Aged Out   DTaP/Tdap/Td  Discontinued    Health Maintenance  Health Maintenance Due  Topic Date Due   Zoster Vaccines- Shingrix (1 of 2) Never done   DEXA SCAN  02/16/2022    Colorectal cancer screening: Type of screening: Colonoscopy. Completed 04/29/2020. Repeat every 5 years  Mammogram status: Completed 04/20/2021. Repeat every year  Bone Density status: Ordered 03/30/2022. Pt provided with contact info and advised to call to schedule appt.  Lung Cancer Screening: (Low Dose CT Chest recommended if Age 55-80 years, 30 pack-year currently smoking OR have quit w/in 15years.) does not qualify.   Lung Cancer Screening Referral: n/a   Additional Screening:  Hepatitis C Screening: does not qualify; Completed 001/24/2023  Vision Screening: Recommended annual ophthalmology exams for early detection of  glaucoma and other disorders of the eye. Is the patient up to date with their annual eye exam?  Yes  Who is the provider or what is the name of the office in which the patient attends annual eye exams? Dr.Lee  If pt is not established with a provider, would they like to be referred to a provider to establish care? No .   Dental Screening: Recommended annual dental exams for proper oral hygiene  Community Resource Referral / Chronic Care Management: CRR required this visit?  No   CCM required this visit?  No      Plan:     I have personally reviewed and noted the following in the patient's chart:   Medical and social history Use of alcohol, tobacco or illicit drugs  Current medications and supplements including opioid prescriptions. Patient is not currently taking opioid prescriptions. Functional ability and status Nutritional status Physical activity Advanced directives List of other physicians Hospitalizations, surgeries, and ER visits in previous 12 months Vitals Screenings to include cognitive, depression, and falls Referrals and appointments  In addition, I have reviewed and discussed with patient certain preventive protocols, quality metrics, and best practice recommendations. A written personalized care plan for preventive services as well as general preventive health recommendations were provided to patient.     Daphane Shepherd, LPN   [id number]   Nurse Notes: Due Tdap Vaccine

## 2022-03-30 NOTE — Telephone Encounter (Signed)
Closing encounter refill sent in another encounter

## 2022-04-15 DIAGNOSIS — K529 Noninfective gastroenteritis and colitis, unspecified: Secondary | ICD-10-CM | POA: Diagnosis not present

## 2022-04-15 DIAGNOSIS — R110 Nausea: Secondary | ICD-10-CM | POA: Diagnosis not present

## 2022-04-20 DIAGNOSIS — J310 Chronic rhinitis: Secondary | ICD-10-CM | POA: Diagnosis not present

## 2022-04-20 DIAGNOSIS — J301 Allergic rhinitis due to pollen: Secondary | ICD-10-CM | POA: Diagnosis not present

## 2022-04-20 DIAGNOSIS — H9311 Tinnitus, right ear: Secondary | ICD-10-CM | POA: Diagnosis not present

## 2022-04-20 DIAGNOSIS — R059 Cough, unspecified: Secondary | ICD-10-CM | POA: Diagnosis not present

## 2022-04-20 DIAGNOSIS — H903 Sensorineural hearing loss, bilateral: Secondary | ICD-10-CM | POA: Diagnosis not present

## 2022-04-20 DIAGNOSIS — J309 Allergic rhinitis, unspecified: Secondary | ICD-10-CM | POA: Diagnosis not present

## 2022-04-21 ENCOUNTER — Other Ambulatory Visit: Payer: Self-pay | Admitting: Family Medicine

## 2022-04-21 DIAGNOSIS — J309 Allergic rhinitis, unspecified: Secondary | ICD-10-CM

## 2022-05-10 ENCOUNTER — Inpatient Hospital Stay: Admission: RE | Admit: 2022-05-10 | Payer: Medicare Other | Source: Ambulatory Visit

## 2022-05-25 ENCOUNTER — Encounter: Payer: Self-pay | Admitting: Family Medicine

## 2022-05-25 ENCOUNTER — Ambulatory Visit (INDEPENDENT_AMBULATORY_CARE_PROVIDER_SITE_OTHER): Payer: Medicare Other | Admitting: Family Medicine

## 2022-05-25 VITALS — BP 147/81 | HR 56 | Temp 95.7°F | Ht 59.0 in | Wt 153.2 lb

## 2022-05-25 DIAGNOSIS — R197 Diarrhea, unspecified: Secondary | ICD-10-CM | POA: Diagnosis not present

## 2022-05-25 LAB — CMP14+EGFR

## 2022-05-25 LAB — CBC WITH DIFFERENTIAL/PLATELET
Basophils Absolute: 0 10*3/uL (ref 0.0–0.2)
Hemoglobin: 13.1 g/dL (ref 11.1–15.9)
Immature Grans (Abs): 0 10*3/uL (ref 0.0–0.1)
Lymphocytes Absolute: 1.4 10*3/uL (ref 0.7–3.1)
Monocytes Absolute: 0.2 10*3/uL (ref 0.1–0.9)
Neutrophils: 51 %
RDW: 13.1 % (ref 11.7–15.4)

## 2022-05-25 NOTE — Progress Notes (Signed)
Subjective:  Patient ID: Nicole Aguirre, female    DOB: 1950/02/02, 72 y.o.   MRN: [id number]  Patient Care Team: Sonny Masters, FNP as PCP - General (Family Medicine)   Chief Complaint:  Follow-up (Urgent care follow up- 04/15/2022/[facility]- N & V & diarrhea- Patient states that she is still having the diarrhea  )   HPI: Nicole Aguirre is a 72 y.o. female presenting on 05/25/2022 for Follow-up (Urgent care follow up- 04/15/2022/[facility]- N & V & diarrhea- Patient states that she is still having the diarrhea  )   Diarrhea  Episode onset: more than 2 months ago. The problem has been waxing and waning. The stool consistency is described as Watery and mucous. Associated symptoms include abdominal pain. Pertinent negatives include no arthralgias, bloating, chills, coughing, fever, headaches, increased  flatus, myalgias, sweats, URI, vomiting or weight loss. Nothing aggravates the symptoms. She has tried anti-motility drug, increased fluids and change of diet (Cipro, Zofran) for the symptoms. The treatment provided no relief.    Relevant past medical, surgical, family, and social history reviewed and updated as indicated.  Allergies and medications reviewed and updated. Data reviewed: Chart in Epic.   Past Medical History:  Diagnosis Date   Allergy    Fibromyalgia    GERD (gastroesophageal reflux disease)    Hyperlipidemia    Hypertension    PVC (premature ventricular contraction)     Past Surgical History:  Procedure Laterality Date   ABDOMINAL HYSTERECTOMY     APPENDECTOMY  1985   CATARACT EXTRACTION, BILATERAL     CHOLECYSTECTOMY  2010    Social History   Socioeconomic History   Marital status: Married    Spouse name: Loraine Leriche   Number of children: 2   Years of education: 10th   Highest education level: GED or equivalent  Occupational History   Occupation: Dietary    Comment: Chief Executive Officer   Occupation: retired   Tobacco Use   Smoking status: Former    Packs/day: 1.00    Years: 4.00    Additional pack years: 0.00    Total pack years: 4.00    Types: Cigarettes    Quit date: 07/25/1976    Years since quitting: 45.8   Smokeless tobacco: Never  Vaping Use   Vaping Use: Never used  Substance and Sexual Activity   Alcohol use: Never   Drug use: Never   Sexual activity: Yes    Birth control/protection: Surgical  Other Topics Concern   Not on file  Social History Narrative   2 sons   5 grandchildren.   Social Determinants of Health   Financial Resource Strain: Low Risk  (03/30/2022)   Overall Financial Resource Strain (CARDIA)    Difficulty of Paying Living Expenses: Not hard at all  Food Insecurity: No Food Insecurity (03/30/2022)   Hunger Vital Sign    Worried About Running Out of Food in the Last Year: Never true    Ran Out of Food in the Last Year: Never true  Transportation Needs: No Transportation Needs (03/30/2022)   PRAPARE - Administrator, Civil Service (Medical): No    Lack of Transportation (Non-Medical): No  Physical Activity: Insufficiently Active (03/30/2022)   Exercise Vital Sign    Days of Exercise per Week: 3 days    Minutes of Exercise per Session: 30 min  Stress: No Stress Concern Present (03/30/2022)   [employer] of Occupational Health -  Occupational Stress Questionnaire    Feeling of Stress : Not at all  Social Connections: Moderately Integrated (03/30/2022)   Social Connection and Isolation Panel [NHANES]    Frequency of Communication with Friends and Family: More than three times a week    Frequency of Social Gatherings with Friends and Family: More than three times a week    Attends Religious Services: More than 4 times per year    Active Member of [employer] or Organizations: No    Attends Banker Meetings: Never    Marital Status: Married  Catering manager Violence: Not At Risk (03/30/2022)   Humiliation, Afraid, Rape, and Kick  questionnaire    Fear of Current or Ex-Partner: No    Emotionally Abused: No    Physically Abused: No    Sexually Abused: No    Outpatient Encounter Medications as of 05/25/2022  Medication Sig   albuterol (VENTOLIN HFA) 108 (90 Base) MCG/ACT inhaler Inhale 2 puffs into the lungs every 6 (six) hours as needed.   amoxicillin (AMOXIL) 875 MG tablet Take 875 mg by mouth 2 (two) times daily.   atorvastatin (LIPITOR) 40 MG tablet Take 1 tablet (40 mg total) by mouth at bedtime.   diclofenac Sodium (VOLTAREN) 1 % GEL APPLY 2 GRAMS TO AFFECTED AREA 4 TIMES A DAY   fexofenadine (ALLEGRA) 180 MG tablet TAKE 1 TABLET BY MOUTH EVERY DAY   fluticasone (FLONASE) 50 MCG/ACT nasal spray Place 2 sprays into both nostrils daily.   gabapentin (NEURONTIN) 300 MG capsule Take 1 capsule (300 mg total) by mouth 3 (three) times daily.   hyoscyamine (LEVSIN) 0.125 MG tablet Take 1 tablet (0.125 mg total) by mouth every 4 (four) hours as needed for cramping.   lisinopril (ZESTRIL) 5 MG tablet Take 2.5 mg by mouth daily.   loperamide (IMODIUM) 2 MG capsule TAKE 1 CAPSULE BY MOUTH 4 TIMES  DAILY AS NEEDED FOR DIARRHEA OR  LOOSE STOOLS   Magnesium 200 MG TABS Take 200 mg by mouth daily.   Magnesium 200 MG TABS Take 1 tablet (200 mg total) by mouth daily.   metoprolol succinate (TOPROL-XL) 25 MG 24 hr tablet TAKE 1/2 TABLET BY MOUTH EVERY DAY   montelukast (SINGULAIR) 10 MG tablet Take 1 tablet (10 mg total) by mouth daily.   moxifloxacin (AVELOX) 400 MG tablet Take 1 tablet (400 mg total) by mouth daily.   omeprazole (PRILOSEC) 40 MG capsule Take 1 capsule (40 mg total) by mouth daily.   ondansetron (ZOFRAN) 4 MG tablet TAKE 1 TABLET BY MOUTH EVERY 8  HOURS AS NEEDED FOR NAUSEA AND  VOMITING   Vitamin D, Ergocalciferol, (DRISDOL) 1.25 MG (50000 UNIT) CAPS capsule Take 1 capsule (50,000 Units total) by mouth every 7 (seven) days.   [DISCONTINUED] brompheniramine-pseudoephedrine-DM 30-2-10 MG/5ML syrup Take 5 mLs by  mouth 4 (four) times daily as needed.   [DISCONTINUED] predniSONE (DELTASONE) 10 MG tablet Take 5 daily for 2 days followed by 4,3,2 and 1 for 2 days each.   No facility-administered encounter medications on file as of 05/25/2022.    Allergies  Allergen Reactions   Hydrochlorothiazide Other (See Comments)    cramping  cramping cramping    cramping    Review of Systems  Constitutional:  Positive for activity change, appetite change and fatigue. Negative for chills, diaphoresis, fever, unexpected weight change and weight loss.  HENT: Negative.    Eyes: Negative.  Negative for photophobia and visual disturbance.  Respiratory:  Negative for cough, chest  tightness and shortness of breath.   Cardiovascular:  Negative for chest pain, palpitations and leg swelling.  Gastrointestinal:  Positive for abdominal pain and diarrhea. Negative for abdominal distention, anal bleeding, bloating, blood in stool, constipation, flatus, nausea, rectal pain and vomiting.  Endocrine: Negative.   Genitourinary:  Negative for decreased urine volume, difficulty urinating, dysuria, frequency and urgency.  Musculoskeletal:  Negative for arthralgias and myalgias.  Skin: Negative.   Allergic/Immunologic: Negative.   Neurological:  Negative for dizziness, tremors, seizures, syncope, facial asymmetry, speech difficulty, weakness, light-headedness, numbness and headaches.  Hematological: Negative.   Psychiatric/Behavioral:  Negative for confusion, hallucinations, sleep disturbance and suicidal ideas.   All other systems reviewed and are negative.       Objective:  BP (!) 147/81   Pulse (!) 56   Temp (!) 95.7 F (35.4 C) (Temporal)   Ht 4\' 11"  (1.499 m)   Wt 153 lb 3.2 oz (69.5 kg)   SpO2 97%   BMI 30.94 kg/m    Wt Readings from Last 3 Encounters:  05/25/22 153 lb 3.2 oz (69.5 kg)  03/30/22 159 lb (72.1 kg)  02/22/22 159 lb 12.8 oz (72.5 kg)    Physical Exam Vitals and nursing note reviewed.   Constitutional:      General: She is not in acute distress.    Appearance: Normal appearance. She is well-developed and well-groomed. She is obese. She is not ill-appearing, toxic-appearing or diaphoretic.  HENT:     Head: Normocephalic and atraumatic.     Jaw: There is normal jaw occlusion.     Right Ear: Hearing normal.     Left Ear: Hearing normal.     Nose: Nose normal.     Mouth/Throat:     Lips: Pink.     Mouth: Mucous membranes are moist.     Pharynx: Oropharynx is clear. Uvula midline.  Eyes:     General: Lids are normal.     Extraocular Movements: Extraocular movements intact.     Conjunctiva/sclera: Conjunctivae normal.     Pupils: Pupils are equal, round, and reactive to light.  Neck:     Thyroid: No thyroid mass, thyromegaly or thyroid tenderness.     Vascular: No carotid bruit or JVD.     Trachea: Trachea and phonation normal.  Cardiovascular:     Rate and Rhythm: Normal rate and regular rhythm.     Chest Wall: PMI is not displaced.     Pulses: Normal pulses.     Heart sounds: Normal heart sounds. No murmur heard.    No friction rub. No gallop.  Pulmonary:     Effort: Pulmonary effort is normal. No respiratory distress.     Breath sounds: Normal breath sounds. No wheezing.  Abdominal:     General: Bowel sounds are normal. There is no distension or abdominal bruit.     Palpations: Abdomen is soft. There is no hepatomegaly, splenomegaly or mass.     Tenderness: There is no abdominal tenderness. There is no right CVA tenderness, left CVA tenderness, guarding or rebound.     Hernia: No hernia is present.  Musculoskeletal:        General: Normal range of motion.     Cervical back: Normal range of motion and neck supple.     Right lower leg: No edema.     Left lower leg: No edema.  Lymphadenopathy:     Cervical: No cervical adenopathy.  Skin:    General: Skin is warm and dry.  Capillary Refill: Capillary refill takes less than 2 seconds.     Coloration: Skin  is not cyanotic, jaundiced or pale.     Findings: No rash.  Neurological:     General: No focal deficit present.     Mental Status: She is alert and oriented to person, place, and time.     Sensory: Sensation is intact.     Motor: Motor function is intact.     Coordination: Coordination is intact.     Gait: Gait is intact.     Deep Tendon Reflexes: Reflexes are normal and symmetric.  Psychiatric:        Attention and Perception: Attention and perception normal.        Mood and Affect: Mood and affect normal.        Speech: Speech normal.        Behavior: Behavior normal. Behavior is cooperative.        Thought Content: Thought content normal.        Cognition and Memory: Cognition and memory normal.        Judgment: Judgment normal.     Results for orders placed or performed in visit on 12/19/21  CMP14+EGFR  Result Value Ref Range   Glucose 79 70 - 99 mg/dL   BUN 14 8 - 27 mg/dL   Creatinine, Ser 1.61 0.57 - 1.00 mg/dL   eGFR 76 >09 UE/AVW/0.98   BUN/Creatinine Ratio 17 12 - 28   Sodium 139 134 - 144 mmol/L   Potassium 4.2 3.5 - 5.2 mmol/L   Chloride 102 96 - 106 mmol/L   CO2 22 20 - 29 mmol/L   Calcium 9.5 8.7 - 10.3 mg/dL   Total Protein 7.3 6.0 - 8.5 g/dL   Albumin 4.3 3.9 - 4.9 g/dL   Globulin, Total 3.0 1.5 - 4.5 g/dL   Albumin/Globulin Ratio 1.4 1.2 - 2.2   Bilirubin Total 0.4 0.0 - 1.2 mg/dL   Alkaline Phosphatase 64 44 - 121 IU/L   AST 16 0 - 40 IU/L   ALT 14 0 - 32 IU/L  CBC with Differential/Platelet  Result Value Ref Range   WBC 4.8 3.4 - 10.8 x10E3/uL   RBC 4.38 3.77 - 5.28 x10E6/uL   Hemoglobin 13.6 11.1 - 15.9 g/dL   Hematocrit 11.9 14.7 - 46.6 %   MCV 93 79 - 97 fL   MCH 31.1 26.6 - 33.0 pg   MCHC 33.4 31.5 - 35.7 g/dL   RDW 82.9 56.2 - 13.0 %   Platelets 240 150 - 450 x10E3/uL   Neutrophils 52 Not Estab. %   Lymphs 37 Not Estab. %   Monocytes 8 Not Estab. %   Eos 3 Not Estab. %   Basos 0 Not Estab. %   Neutrophils Absolute 2.5 1.4 - 7.0  x10E3/uL   Lymphocytes Absolute 1.8 0.7 - 3.1 x10E3/uL   Monocytes Absolute 0.4 0.1 - 0.9 x10E3/uL   EOS (ABSOLUTE) 0.1 0.0 - 0.4 x10E3/uL   Basophils Absolute 0.0 0.0 - 0.2 x10E3/uL   Immature Granulocytes 0 Not Estab. %   Immature Grans (Abs) 0.0 0.0 - 0.1 x10E3/uL  Vitamin B12  Result Value Ref Range   Vitamin B-12 878 232 - 1,245 pg/mL  VITAMIN D 25 Hydroxy (Vit-D Deficiency, Fractures)  Result Value Ref Range   Vit D, 25-Hydroxy 69.8 30.0 - 100.0 ng/mL  Magnesium  Result Value Ref Range   Magnesium 1.6 1.6 - 2.3 mg/dL       Pertinent  labs & imaging results that were available during my care of the patient were reviewed by me and considered in my medical decision making.  Assessment & Plan:  [place] was seen today for follow-up.  Diagnoses and all orders for this visit:  Diarrhea in adult patient Ongoing despite symptomatic care at home and Cipro from UC. Will obtain stool for testing and check CBC/CMP. Referral to GI placed. Further treatment pending results.  -     Cdiff NAA+O+P+Stool Culture -     Stool culture -     CMP14+EGFR -     CBC with Differential/Platelet -     Ambulatory referral to Gastroenterology     Continue all other maintenance medications.  Follow up plan: Return if symptoms worsen or fail to improve.   Continue healthy lifestyle choices, including diet (rich in fruits, vegetables, and lean proteins, and low in salt and simple carbohydrates) and exercise (at least 30 minutes of moderate physical activity daily).  Educational handout given for diarrhea  The above assessment and management plan was discussed with the patient. The patient verbalized understanding of and has agreed to the management plan. Patient is aware to call the clinic if they develop any new symptoms or if symptoms persist or worsen. Patient is aware when to return to the clinic for a follow-up visit. Patient educated on when it is appropriate to go to the emergency department.    Kari Baars, FNP-C [facility] [phone]

## 2022-05-26 LAB — CMP14+EGFR
Albumin/Globulin Ratio: 1.5 (ref 1.2–2.2)
Albumin: 4 g/dL (ref 3.9–4.9)
Alkaline Phosphatase: 53 IU/L (ref 44–121)
BUN/Creatinine Ratio: 5 — ABNORMAL LOW (ref 12–28)
Bilirubin Total: 0.5 mg/dL (ref 0.0–1.2)
CO2: 22 mmol/L (ref 20–29)
Calcium: 9.2 mg/dL (ref 8.7–10.3)
Chloride: 103 mmol/L (ref 96–106)
Globulin, Total: 2.7 g/dL (ref 1.5–4.5)
Glucose: 73 mg/dL (ref 70–99)
Potassium: 4.4 mmol/L (ref 3.5–5.2)
Sodium: 138 mmol/L (ref 134–144)

## 2022-05-26 LAB — CBC WITH DIFFERENTIAL/PLATELET
Basos: 0 %
EOS (ABSOLUTE): 0.1 10*3/uL (ref 0.0–0.4)
Eos: 2 %
Hematocrit: 39.6 % (ref 34.0–46.6)
Immature Granulocytes: 0 %
Lymphs: 40 %
MCH: 31.1 pg (ref 26.6–33.0)
MCHC: 33.1 g/dL (ref 31.5–35.7)
MCV: 94 fL (ref 79–97)
Monocytes: 7 %
Neutrophils Absolute: 1.8 10*3/uL (ref 1.4–7.0)
Platelets: 226 10*3/uL (ref 150–450)
RBC: 4.21 x10E6/uL (ref 3.77–5.28)
WBC: 3.5 10*3/uL (ref 3.4–10.8)

## 2022-05-29 ENCOUNTER — Other Ambulatory Visit: Payer: Medicare Other

## 2022-05-29 ENCOUNTER — Telehealth: Payer: Self-pay | Admitting: Family Medicine

## 2022-05-29 DIAGNOSIS — R197 Diarrhea, unspecified: Secondary | ICD-10-CM | POA: Diagnosis not present

## 2022-05-30 LAB — CDIFF NAA+O+P+STOOL CULTURE

## 2022-05-31 LAB — CDIFF NAA+O+P+STOOL CULTURE

## 2022-06-01 LAB — CDIFF NAA+O+P+STOOL CULTURE
E coli, Shiga toxin Assay: NEGATIVE
Toxigenic C. Difficile by PCR: NEGATIVE

## 2022-06-02 LAB — CDIFF NAA+O+P+STOOL CULTURE: Toxigenic C. Difficile by PCR: NEGATIVE

## 2022-06-03 ENCOUNTER — Other Ambulatory Visit: Payer: Self-pay | Admitting: Family Medicine

## 2022-06-03 DIAGNOSIS — M797 Fibromyalgia: Secondary | ICD-10-CM

## 2022-06-05 NOTE — Telephone Encounter (Signed)
Patient wanted to let you know [facility]. FYI

## 2022-06-14 ENCOUNTER — Ambulatory Visit
Admission: RE | Admit: 2022-06-14 | Discharge: 2022-06-14 | Disposition: A | Discharge status: Home / Self Care | Payer: Medicare Other | Source: Ambulatory Visit | Attending: Family Medicine | Admitting: Family Medicine

## 2022-06-14 DIAGNOSIS — Z1231 Encounter for screening mammogram for malignant neoplasm of breast: Secondary | ICD-10-CM | POA: Diagnosis not present

## 2022-06-16 ENCOUNTER — Other Ambulatory Visit: Payer: Self-pay | Admitting: Family Medicine

## 2022-06-16 DIAGNOSIS — E782 Mixed hyperlipidemia: Secondary | ICD-10-CM

## 2022-06-16 DIAGNOSIS — K219 Gastro-esophageal reflux disease without esophagitis: Secondary | ICD-10-CM

## 2022-06-16 DIAGNOSIS — K582 Mixed irritable bowel syndrome: Secondary | ICD-10-CM

## 2022-06-16 DIAGNOSIS — M797 Fibromyalgia: Secondary | ICD-10-CM

## 2022-06-16 DIAGNOSIS — R197 Diarrhea, unspecified: Secondary | ICD-10-CM | POA: Diagnosis not present

## 2022-06-16 DIAGNOSIS — E559 Vitamin D deficiency, unspecified: Secondary | ICD-10-CM

## 2022-06-16 DIAGNOSIS — T7840XS Allergy, unspecified, sequela: Secondary | ICD-10-CM

## 2022-06-16 DIAGNOSIS — D126 Benign neoplasm of colon, unspecified: Secondary | ICD-10-CM | POA: Diagnosis not present

## 2022-06-21 ENCOUNTER — Encounter: Payer: Medicare Other | Admitting: Family Medicine

## 2022-06-27 ENCOUNTER — Encounter: Payer: Medicare Other | Admitting: Family Medicine

## 2022-07-20 ENCOUNTER — Other Ambulatory Visit: Payer: Self-pay | Admitting: Family Medicine

## 2022-07-20 DIAGNOSIS — I10 Essential (primary) hypertension: Secondary | ICD-10-CM

## 2022-07-30 IMAGING — MG MM DIGITAL SCREENING BILAT W/ TOMO AND CAD
8 series · 8 of 24 positions shown · non-contrast
Comparison: None.

CLINICAL DATA: Screening.

EXAM:
DIGITAL SCREENING BILATERAL MAMMOGRAM WITH TOMOSYNTHESIS AND CAD
TECHNIQUE: Bilateral screening digital craniocaudal and mediolateral oblique
mammograms were obtained. Bilateral screening digital breast
tomosynthesis was performed. The images were evaluated with
computer-aided detection.

[R MLO synth-2D]
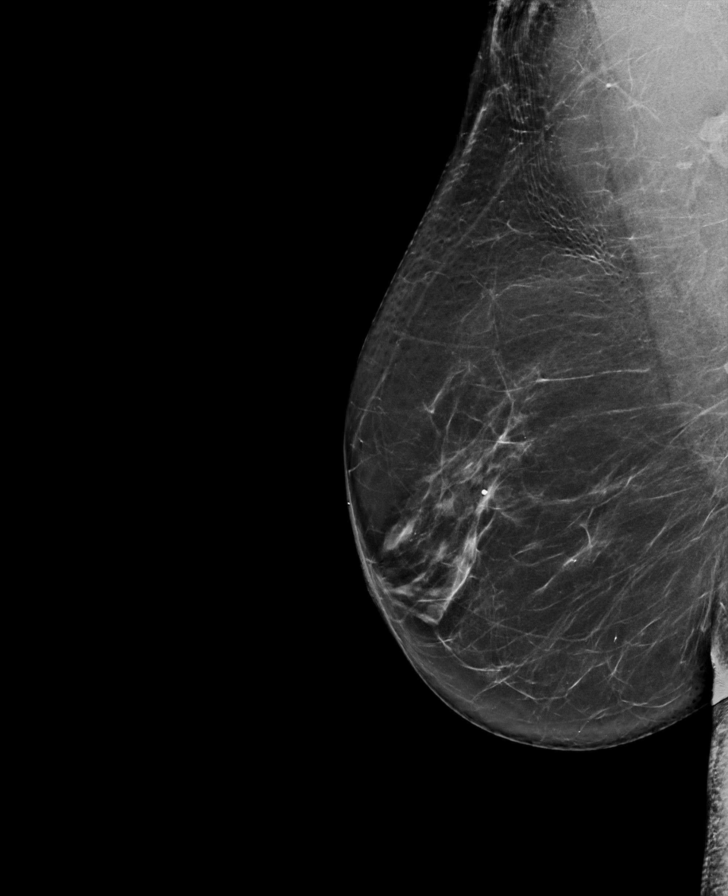

[R CC synth-2D]
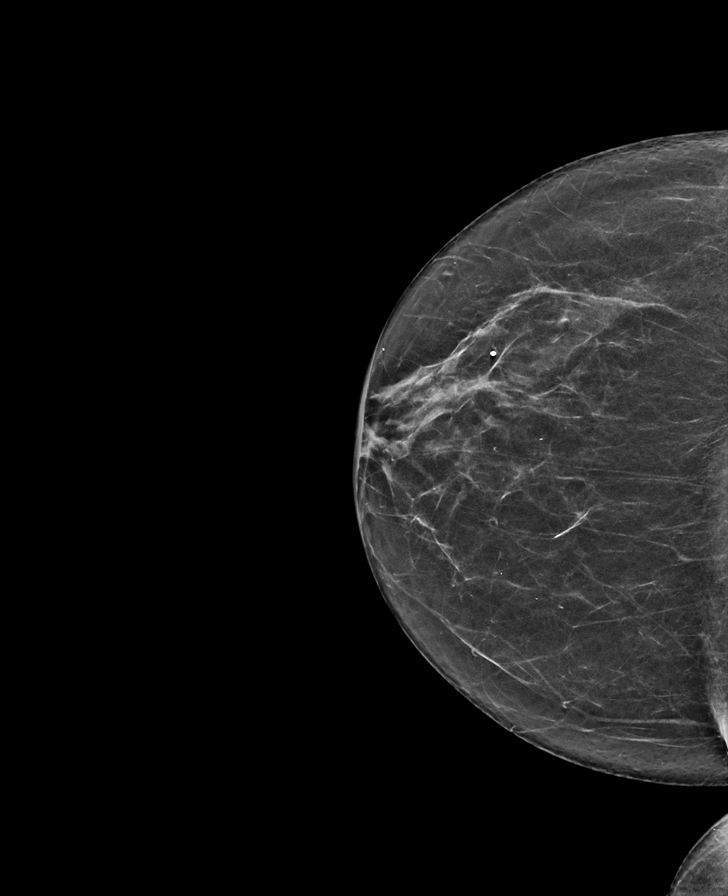

[L MLO synth-2D]
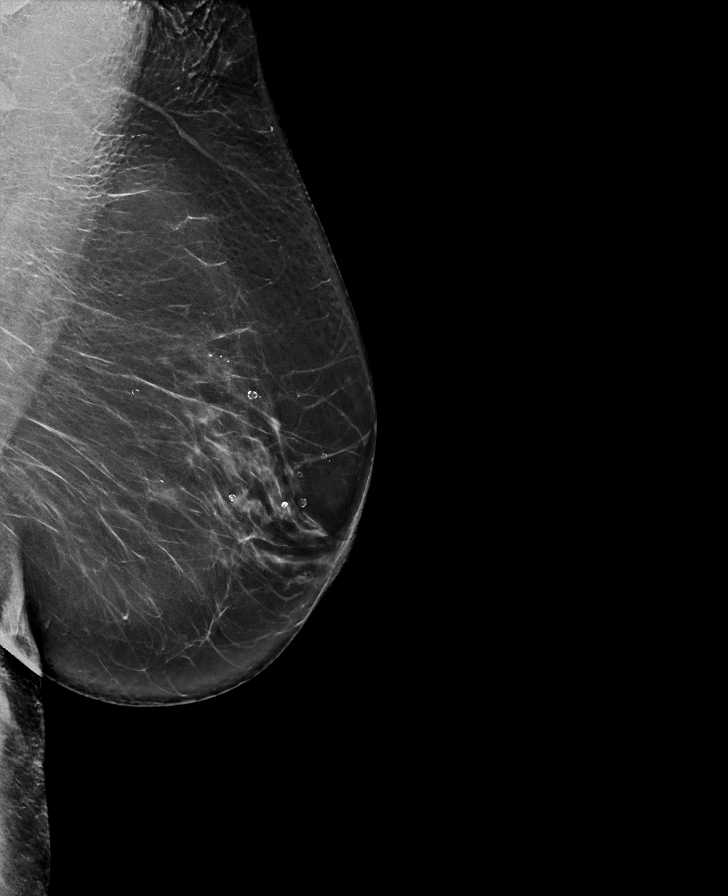

[L CC synth-2D]
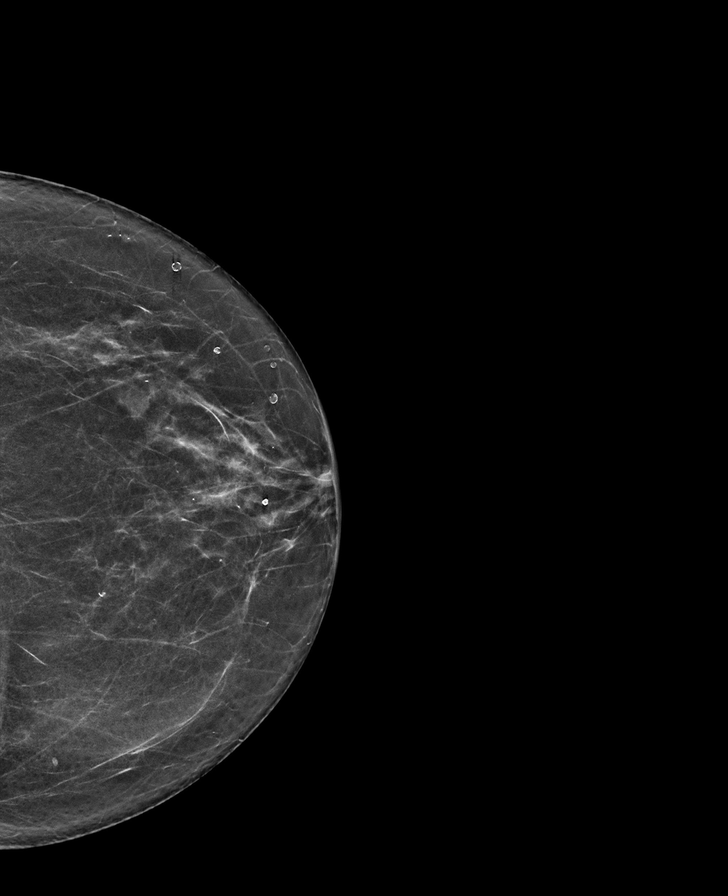

[R CC tomo · tomo slice 31/61.0]
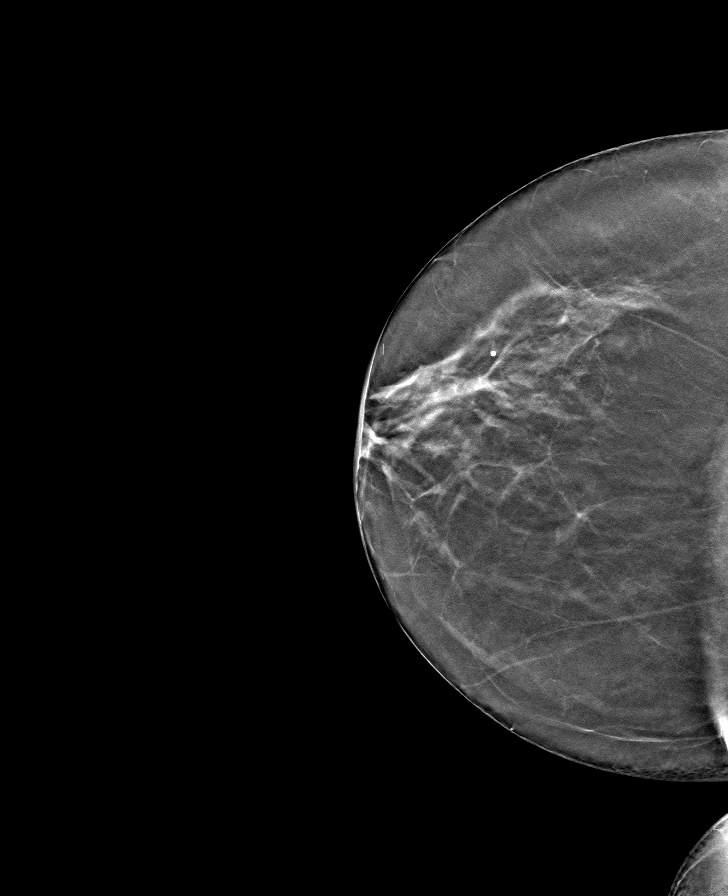

[L CC tomo · tomo slice 31/61.0]
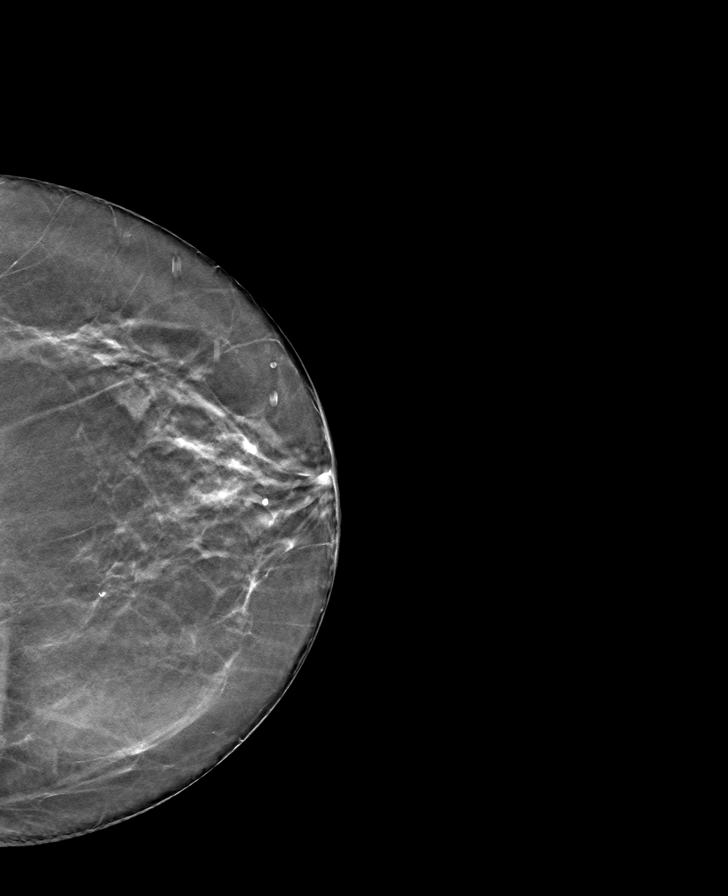

[R MLO tomo · tomo slice 37/74.0]
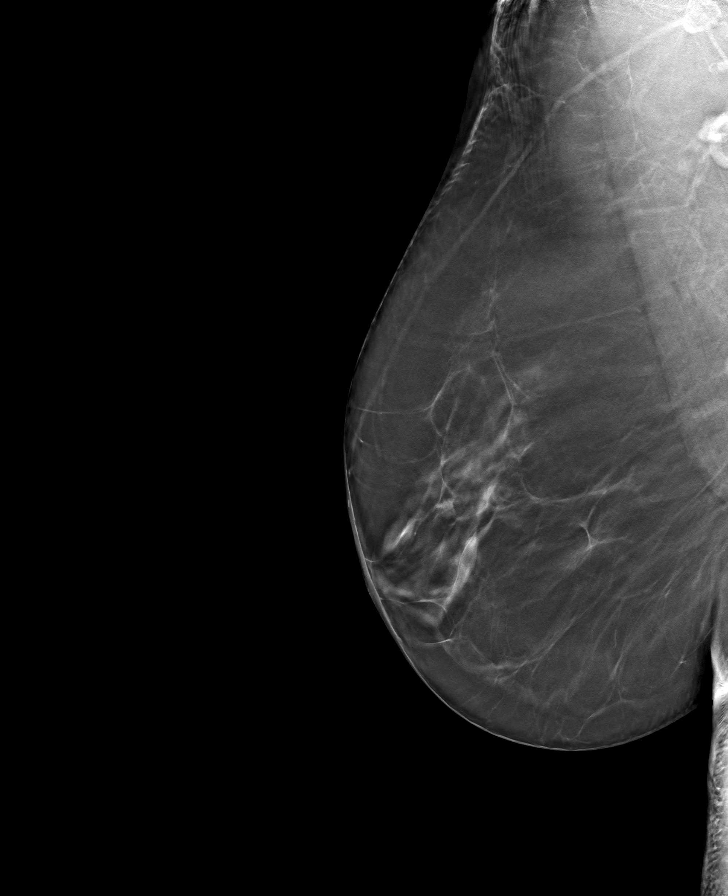

[L MLO tomo · tomo slice 40/79.0]
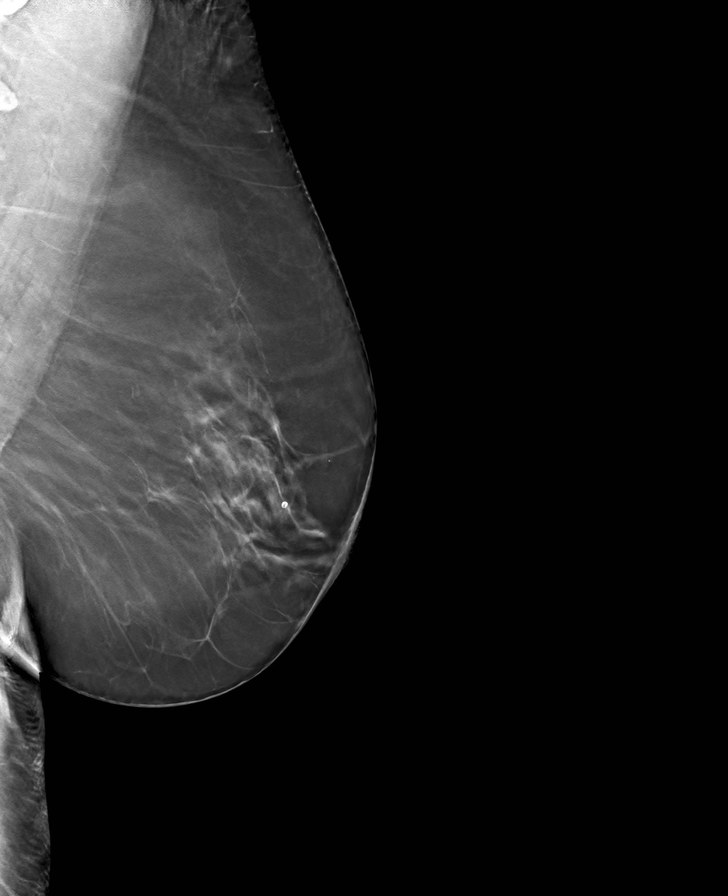

[8 of 24 positions shown; findings below may reference images not displayed]

ACR Breast Density Category b: There are scattered areas of
fibroglandular density.
FINDINGS: There are no findings suspicious for malignancy. The images were
evaluated with computer-aided detection.
IMPRESSION: No mammographic evidence of malignancy. A result letter of this
screening mammogram will be mailed directly to the patient.

RECOMMENDATION:
Screening mammogram in one year. (Code:C7-6-ASJ)

BI-RADS CATEGORY  1: Negative.

## 2022-08-03 ENCOUNTER — Other Ambulatory Visit: Payer: Self-pay | Admitting: Family Medicine

## 2022-08-07 ENCOUNTER — Other Ambulatory Visit: Payer: Self-pay | Admitting: Family Medicine

## 2022-08-08 MED ORDER — DICLOFENAC SODIUM 1 % EX GEL: CUTANEOUS | 3 refills | Status: DC

## 2022-08-08 NOTE — Telephone Encounter (Signed)
Refill failed. resent °

## 2022-08-08 NOTE — Addendum Note (Signed)
Addended by: Julious Payer D on: 08/08/2022 11:29 AM   Modules accepted: Orders

## 2022-08-29 ENCOUNTER — Telehealth: Payer: Self-pay | Admitting: Family Medicine

## 2022-08-29 MED ORDER — MAGNESIUM 200 MG PO TABS: 200.0000 mg | ORAL_TABLET | Freq: Every day | ORAL | 1 refills | Status: AC

## 2022-08-29 NOTE — Telephone Encounter (Signed)
TC to [facility] they did not have our RF from December, sent in refill till December.

## 2022-09-22 DIAGNOSIS — I10 Essential (primary) hypertension: Secondary | ICD-10-CM | POA: Diagnosis not present

## 2022-09-22 DIAGNOSIS — R519 Headache, unspecified: Secondary | ICD-10-CM | POA: Diagnosis not present

## 2022-10-01 ENCOUNTER — Other Ambulatory Visit: Payer: Self-pay | Admitting: Family Medicine

## 2022-10-01 DIAGNOSIS — K219 Gastro-esophageal reflux disease without esophagitis: Secondary | ICD-10-CM

## 2022-10-01 DIAGNOSIS — E782 Mixed hyperlipidemia: Secondary | ICD-10-CM

## 2022-10-15 ENCOUNTER — Other Ambulatory Visit: Payer: Self-pay | Admitting: Family Medicine

## 2022-10-15 DIAGNOSIS — I10 Essential (primary) hypertension: Secondary | ICD-10-CM

## 2022-10-22 ENCOUNTER — Other Ambulatory Visit: Payer: Self-pay | Admitting: Family Medicine

## 2022-10-22 DIAGNOSIS — M797 Fibromyalgia: Secondary | ICD-10-CM

## 2022-10-23 NOTE — Telephone Encounter (Signed)
[facility] patient  Last office visit 05/25/22 Last refill 06/16/22 #300, no refills Upcoming appointment 10/24/22

## 2022-10-24 ENCOUNTER — Encounter: Payer: Medicare Other | Admitting: Family Medicine

## 2022-10-24 ENCOUNTER — Other Ambulatory Visit: Payer: Self-pay | Admitting: Family Medicine

## 2022-10-24 ENCOUNTER — Other Ambulatory Visit: Payer: Medicare Other

## 2022-10-24 DIAGNOSIS — J309 Allergic rhinitis, unspecified: Secondary | ICD-10-CM

## 2022-10-24 DIAGNOSIS — K582 Mixed irritable bowel syndrome: Secondary | ICD-10-CM

## 2022-10-24 DIAGNOSIS — K219 Gastro-esophageal reflux disease without esophagitis: Secondary | ICD-10-CM

## 2022-10-24 DIAGNOSIS — E782 Mixed hyperlipidemia: Secondary | ICD-10-CM

## 2022-10-25 ENCOUNTER — Encounter: Payer: Self-pay | Admitting: Family Medicine

## 2022-10-25 ENCOUNTER — Ambulatory Visit (INDEPENDENT_AMBULATORY_CARE_PROVIDER_SITE_OTHER): Payer: Medicare Other | Admitting: Family Medicine

## 2022-10-25 VITALS — BP 176/102 | HR 55 | Temp 97.5°F | Ht 59.0 in | Wt 145.4 lb

## 2022-10-25 DIAGNOSIS — I10 Essential (primary) hypertension: Secondary | ICD-10-CM

## 2022-10-25 DIAGNOSIS — Z0000 Encounter for general adult medical examination without abnormal findings: Secondary | ICD-10-CM | POA: Diagnosis not present

## 2022-10-25 DIAGNOSIS — E559 Vitamin D deficiency, unspecified: Secondary | ICD-10-CM | POA: Diagnosis not present

## 2022-10-25 DIAGNOSIS — J0140 Acute pansinusitis, unspecified: Secondary | ICD-10-CM | POA: Diagnosis not present

## 2022-10-25 MED ORDER — PREDNISONE 20 MG PO TABS
40.0000 mg | ORAL_TABLET | Freq: Every day | ORAL | 0 refills | Status: AC
Start: 2022-10-25 — End: 2022-10-30

## 2022-10-25 MED ORDER — AMOXICILLIN-POT CLAVULANATE 875-125 MG PO TABS
1.0000 | ORAL_TABLET | Freq: Two times a day (BID) | ORAL | 0 refills | Status: AC
Start: 2022-10-25 — End: 2022-11-04

## 2022-10-25 NOTE — Patient Instructions (Addendum)
Coricidin HBP Delsym

## 2022-10-25 NOTE — Progress Notes (Signed)
Subjective:  Patient ID: Nicole Aguirre, female    DOB: 12/13/1950, 72 y.o.   MRN: [id number]  Patient Care Team: Sonny Masters, FNP as PCP - General (Family Medicine)   Chief Complaint:  Nasal Congestion, Cough, Headache, facial pressure, Generalized Body Aches (/), and Chills (X 1 week )   HPI: Nicole Aguirre is a 72 y.o. female presenting on 10/25/2022 for Nasal Congestion, Cough, Headache, facial pressure, Generalized Body Aches (/), and Chills (X 1 week )   Discussed the use of AI scribe software for clinical note transcription with the patient, who gave verbal consent to proceed.  History of Present Illness   The patient, known to have hypertension, presents with a week-long history of facial pressure, thick white nasal drainage, and a new cough. Despite regular allergy medication, she reports persistent nasal congestion. She also describes experiencing chills, malaise, and body aches. The patient denies fever and exposure to sick contacts. She has been monitoring her blood pressure at home, which is occasionally high in the mornings but normalizes after taking her antihypertensive medication. She reports occasional dizziness, particularly when bending down, but denies any associated syncope, visual changes, confusion, numbness, tingling, or weakness.  The patient has been using Flonase as prescribed for the past four to five months to reduce nasal mucus. She has also been experiencing sinus tenderness and has been taking Tylenol for relief. She has not noticed any neck swelling.        Relevant past medical, surgical, family, and social history reviewed and updated as indicated.  Allergies and medications reviewed and updated. Data reviewed: Chart in Epic.   Past Medical History:  Diagnosis Date   Allergy    Fibromyalgia    GERD (gastroesophageal reflux disease)    Hyperlipidemia    Hypertension    PVC (premature ventricular contraction)     Past Surgical  History:  Procedure Laterality Date   ABDOMINAL HYSTERECTOMY     APPENDECTOMY  1985   CATARACT EXTRACTION, BILATERAL     CHOLECYSTECTOMY  2010    Social History   Socioeconomic History   Marital status: Married    Spouse name: Loraine Leriche   Number of children: 2   Years of education: 10th   Highest education level: GED or equivalent  Occupational History   Occupation: Dietary    Comment: Chief Executive Officer   Occupation: retired  Tobacco Use   Smoking status: Former    Current packs/day: 0.00    Average packs/day: 1 pack/day for 4.0 years (4.0 ttl pk-yrs)    Types: Cigarettes    Start date: 07/25/1972    Quit date: 07/25/1976    Years since quitting: 46.2   Smokeless tobacco: Never  Vaping Use   Vaping status: Never Used  Substance and Sexual Activity   Alcohol use: Never   Drug use: Never   Sexual activity: Yes    Birth control/protection: Surgical  Other Topics Concern   Not on file  Social History Narrative   2 sons   5 grandchildren.   Social Determinants of Health   Financial Resource Strain: Low Risk  (03/30/2022)   Overall Financial Resource Strain (CARDIA)    Difficulty of Paying Living Expenses: Not hard at all  Food Insecurity: No Food Insecurity (03/30/2022)   Hunger Vital Sign    Worried About Running Out of Food in the Last Year: Never true    Ran Out of Food in the Last Year: Never true  Transportation Needs: No Transportation  Needs (03/30/2022)   PRAPARE - Administrator, Civil Service (Medical): No    Lack of Transportation (Non-Medical): No  Physical Activity: Insufficiently Active (03/30/2022)   Exercise Vital Sign    Days of Exercise per Week: 3 days    Minutes of Exercise per Session: 30 min  Stress: No Stress Concern Present (03/30/2022)   [employer] of Occupational Health - Occupational Stress Questionnaire    Feeling of Stress : Not at all  Social Connections: Moderately Integrated (03/30/2022)   Social Connection and Isolation Panel  [NHANES]    Frequency of Communication with Friends and Family: More than three times a week    Frequency of Social Gatherings with Friends and Family: More than three times a week    Attends Religious Services: More than 4 times per year    Active Member of [employer] or Organizations: No    Attends Banker Meetings: Never    Marital Status: Married  Catering manager Violence: Not At Risk (03/30/2022)   Humiliation, Afraid, Rape, and Kick questionnaire    Fear of Current or Ex-Partner: No    Emotionally Abused: No    Physically Abused: No    Sexually Abused: No    Outpatient Encounter Medications as of 10/25/2022  Medication Sig   albuterol (VENTOLIN HFA) 108 (90 Base) MCG/ACT inhaler Inhale 2 puffs into the lungs every 6 (six) hours as needed.   amoxicillin-clavulanate (AUGMENTIN) 875-125 MG tablet Take 1 tablet by mouth 2 (two) times daily for 10 days.   atorvastatin (LIPITOR) 40 MG tablet TAKE 1 TABLET BY MOUTH AT  BEDTIME   diclofenac Sodium (VOLTAREN) 1 % GEL APPLY 2 GRAMS TO AFFECTED AREA 4 TIMES A DAY   fexofenadine (ALLEGRA) 180 MG tablet TAKE 1 TABLET BY MOUTH EVERY DAY   fluticasone (FLONASE) 50 MCG/ACT nasal spray USE 2 SPRAYS IN BOTH NOSTRILS  DAILY   gabapentin (NEURONTIN) 300 MG capsule TAKE 1 CAPSULE BY MOUTH 3 TIMES  DAILY   hyoscyamine (LEVSIN) 0.125 MG tablet Take 1 tablet (0.125 mg total) by mouth every 4 (four) hours as needed for cramping.   lisinopril (ZESTRIL) 5 MG tablet Take 2.5 mg by mouth daily.   loperamide (IMODIUM) 2 MG capsule TAKE 1 CAPSULE BY MOUTH 4 TIMES  DAILY AS NEEDED FOR DIARRHEA OR  LOOSE STOOLS   Magnesium 200 MG TABS Take 1 tablet (200 mg total) by mouth daily.   metoprolol succinate (TOPROL-XL) 25 MG 24 hr tablet TAKE 1/2 TABLET BY MOUTH DAILY   montelukast (SINGULAIR) 10 MG tablet TAKE 1 TABLET BY MOUTH DAILY   moxifloxacin (AVELOX) 400 MG tablet Take 1 tablet (400 mg total) by mouth daily.   omeprazole (PRILOSEC) 40 MG capsule TAKE 1  CAPSULE BY MOUTH DAILY   ondansetron (ZOFRAN) 4 MG tablet TAKE 1 TABLET BY MOUTH EVERY 8  HOURS AS NEEDED FOR NAUSEA AND  VOMITING   predniSONE (DELTASONE) 20 MG tablet Take 2 tablets (40 mg total) by mouth daily with breakfast for 5 days.   Vitamin D, Ergocalciferol, (DRISDOL) 1.25 MG (50000 UNIT) CAPS capsule TAKE 1 CAPSULE BY MOUTH EVERY 7  DAYS   [DISCONTINUED] amoxicillin (AMOXIL) 875 MG tablet Take 875 mg by mouth 2 (two) times daily.   No facility-administered encounter medications on file as of 10/25/2022.    Allergies  Allergen Reactions   Hydrochlorothiazide Other (See Comments)    cramping  cramping cramping    cramping    ROS per HPI otherwise  negative      Objective:  BP (!) 176/102   Pulse (!) 55   Temp (!) 97.5 F (36.4 C) (Temporal)   Ht 4\' 11"  (1.499 m)   Wt 145 lb 6.4 oz (66 kg)   SpO2 97%   BMI 29.37 kg/m    Wt Readings from Last 3 Encounters:  10/25/22 145 lb 6.4 oz (66 kg)  05/25/22 153 lb 3.2 oz (69.5 kg)  03/30/22 159 lb (72.1 kg)    Physical Exam Vitals and nursing note reviewed.  Constitutional:      Appearance: She is well-developed. She is obese.  HENT:     Head: Normocephalic and atraumatic.     Mouth/Throat:     Mouth: Mucous membranes are moist.     Pharynx: Oropharynx is clear.  Eyes:     Extraocular Movements: Extraocular movements intact.     Pupils: Pupils are equal, round, and reactive to light.  Pulmonary:     Effort: Pulmonary effort is normal.     Breath sounds: Normal breath sounds.  Lymphadenopathy:     Cervical: Cervical adenopathy present.  Skin:    General: Skin is warm.     Capillary Refill: Capillary refill takes less than 2 seconds.  Neurological:     Mental Status: She is alert and oriented to person, place, and time.  Psychiatric:        Mood and Affect: Mood normal.        Speech: Speech normal.        Behavior: Behavior normal.    Physical Exam   HEENT: Fluid behind ears, no infection. Postnasal  drainage, no significant redness. Sinus tenderness upon palpation. NECK: Cervical lymphadenopathy. CHEST: Lungs clear. CARDIOVASCULAR: Heart sounds regular, no murmurs.      Results for orders placed or performed in visit on 05/25/22  Cdiff NAA+O+P+Stool Culture   Specimen: Stool   ST  Result Value Ref Range   Salmonella/Shigella Screen Final report    Stool Culture result 1 (RSASHR) Comment    Campylobacter Culture Final report    Stool Culture result 1 (CMPCXR) Comment    E coli, Shiga toxin Assay Negative Negative   OVA + PARASITE EXAM Final report    O&P result 1 Comment    Toxigenic C. Difficile by PCR Negative Negative  CMP14+EGFR  Result Value Ref Range   Glucose 73 70 - 99 mg/dL   BUN 4 (L) 8 - 27 mg/dL   Creatinine, Ser 1.61 0.57 - 1.00 mg/dL   eGFR 82 >09 UE/AVW/0.98   BUN/Creatinine Ratio 5 (L) 12 - 28   Sodium 138 134 - 144 mmol/L   Potassium 4.4 3.5 - 5.2 mmol/L   Chloride 103 96 - 106 mmol/L   CO2 22 20 - 29 mmol/L   Calcium 9.2 8.7 - 10.3 mg/dL   Total Protein 6.7 6.0 - 8.5 g/dL   Albumin 4.0 3.9 - 4.9 g/dL   Globulin, Total 2.7 1.5 - 4.5 g/dL   Albumin/Globulin Ratio 1.5 1.2 - 2.2   Bilirubin Total 0.5 0.0 - 1.2 mg/dL   Alkaline Phosphatase 53 44 - 121 IU/L   AST 25 0 - 40 IU/L   ALT 19 0 - 32 IU/L  CBC with Differential/Platelet  Result Value Ref Range   WBC 3.5 3.4 - 10.8 x10E3/uL   RBC 4.21 3.77 - 5.28 x10E6/uL   Hemoglobin 13.1 11.1 - 15.9 g/dL   Hematocrit 11.9 14.7 - 46.6 %   MCV 94 79 -  97 fL   MCH 31.1 26.6 - 33.0 pg   MCHC 33.1 31.5 - 35.7 g/dL   RDW 29.5 62.1 - 30.8 %   Platelets 226 150 - 450 x10E3/uL   Neutrophils 51 Not Estab. %   Lymphs 40 Not Estab. %   Monocytes 7 Not Estab. %   Eos 2 Not Estab. %   Basos 0 Not Estab. %   Neutrophils Absolute 1.8 1.4 - 7.0 x10E3/uL   Lymphocytes Absolute 1.4 0.7 - 3.1 x10E3/uL   Monocytes Absolute 0.2 0.1 - 0.9 x10E3/uL   EOS (ABSOLUTE) 0.1 0.0 - 0.4 x10E3/uL   Basophils Absolute 0.0 0.0 - 0.2  x10E3/uL   Immature Granulocytes 0 Not Estab. %   Immature Grans (Abs) 0.0 0.0 - 0.1 x10E3/uL       Pertinent labs & imaging results that were available during my care of the patient were reviewed by me and considered in my medical decision making.  Assessment & Plan:  Nicole Aguirre was seen today for nasal congestion, cough, headache, facial pressure, generalized body aches and chills.  Diagnoses and all orders for this visit:  Assessment and Plan    Recurrent Sinusitis Facial pressure, thick white drainage, cough, and chills for one week. Tenderness in frontal and maxillary sinuses. Postnasal drainage and fluid in ears likely causing dizziness. -Increase Flonase to twice daily for one week. -Start short course of Prednisone. -Start Augmentin. -Consider over-the-counter Delsym for cough and Coricidin HBP for congestion if needed.  Hypertension Elevated blood pressure at today's visit despite medication compliance. Patient reports occasional high readings at home. -Continue current antihypertensive regimen. -Monitor blood pressure at home and report if consistently elevated. -Avoid over-the-counter cough medicines that may increase blood pressure. -Limit salt intake.  Follow-up Physical scheduled for February. Patient requested time change to 9am. -Check with Shanda Bumps regarding possibility of changing appointment time.      Annual physical exam Scheduled for the future, will get labs -     CMP14+EGFR -     CBC with Differential/Platelet -     Lipid panel -     Thyroid Panel With TSH -     VITAMIN D 25 Hydroxy (Vit-D Deficiency, Fractures)  Essential hypertension -     CMP14+EGFR -     CBC with Differential/Platelet -     Lipid panel -     Thyroid Panel With TSH  Vitamin D deficiency Will repeat labs today -     VITAMIN D 25 Hydroxy (Vit-D Deficiency, Fractures)     Continue all other maintenance medications.  Follow up plan: Return if symptoms worsen or fail to  improve.   Continue healthy lifestyle choices, including diet (rich in fruits, vegetables, and lean proteins, and low in salt and simple carbohydrates) and exercise (at least 30 minutes of moderate physical activity daily).  Educational handout given for sinus infection  The above assessment and management plan was discussed with the patient. The patient verbalized understanding of and has agreed to the management plan. Patient is aware to call the clinic if they develop any new symptoms or if symptoms persist or worsen. Patient is aware when to return to the clinic for a follow-up visit. Patient educated on when it is appropriate to go to the emergency department.   Nicole Baars, FNP-C [facility] [phone]

## 2022-10-26 LAB — CBC WITH DIFFERENTIAL/PLATELET
Basophils Absolute: 0 10*3/uL (ref 0.0–0.2)
Basos: 1 %
EOS (ABSOLUTE): 0.1 10*3/uL (ref 0.0–0.4)
Eos: 3 %
Hematocrit: 43.3 % (ref 34.0–46.6)
Hemoglobin: 13.6 g/dL (ref 11.1–15.9)
Immature Grans (Abs): 0 10*3/uL (ref 0.0–0.1)
Immature Granulocytes: 0 %
Lymphocytes Absolute: 1.3 10*3/uL (ref 0.7–3.1)
Lymphs: 35 %
MCH: 30 pg (ref 26.6–33.0)
MCHC: 31.4 g/dL — ABNORMAL LOW (ref 31.5–35.7)
MCV: 96 fL (ref 79–97)
Monocytes Absolute: 0.3 10*3/uL (ref 0.1–0.9)
Monocytes: 9 %
Neutrophils Absolute: 2 10*3/uL (ref 1.4–7.0)
Neutrophils: 52 %
Platelets: 225 10*3/uL (ref 150–450)
RBC: 4.53 x10E6/uL (ref 3.77–5.28)
RDW: 13.1 % (ref 11.7–15.4)
WBC: 3.8 10*3/uL (ref 3.4–10.8)

## 2022-10-26 LAB — CMP14+EGFR
ALT: 11 [IU]/L (ref 0–32)
AST: 16 [IU]/L (ref 0–40)
Albumin: 4.1 g/dL (ref 3.9–4.9)
Alkaline Phosphatase: 55 [IU]/L (ref 44–121)
BUN/Creatinine Ratio: 9 — ABNORMAL LOW (ref 12–28)
BUN: 7 mg/dL — ABNORMAL LOW (ref 8–27)
Bilirubin Total: 0.5 mg/dL (ref 0.0–1.2)
CO2: 23 mmol/L (ref 20–29)
Calcium: 9.5 mg/dL (ref 8.7–10.3)
Chloride: 104 mmol/L (ref 96–106)
Creatinine, Ser: 0.82 mg/dL (ref 0.57–1.00)
Globulin, Total: 3 g/dL (ref 1.5–4.5)
Glucose: 84 mg/dL (ref 70–99)
Potassium: 4.3 mmol/L (ref 3.5–5.2)
Sodium: 139 mmol/L (ref 134–144)
Total Protein: 7.1 g/dL (ref 6.0–8.5)
eGFR: 77 mL/min/{1.73_m2} (ref >59)

## 2022-10-26 LAB — LIPID PANEL
Chol/HDL Ratio: 2.7 {ratio} (ref 0.0–4.4)
Cholesterol, Total: 189 mg/dL (ref 100–199)
HDL: 70 mg/dL (ref >39)
LDL Chol Calc (NIH): 100 mg/dL — ABNORMAL HIGH (ref 0–99)
Triglycerides: 105 mg/dL (ref 0–149)
VLDL Cholesterol Cal: 19 mg/dL (ref 5–40)

## 2022-10-26 LAB — THYROID PANEL WITH TSH
Free Thyroxine Index: 2.7 (ref 1.2–4.9)
T3 Uptake Ratio: 27 % (ref 24–39)
T4, Total: 10 ug/dL (ref 4.5–12.0)
TSH: 1.57 u[IU]/mL (ref 0.450–4.500)

## 2022-10-26 LAB — VITAMIN D 25 HYDROXY (VIT D DEFICIENCY, FRACTURES): Vit D, 25-Hydroxy: 86.8 ng/mL (ref 30.0–100.0)

## 2022-10-27 ENCOUNTER — Other Ambulatory Visit: Payer: Medicare Other

## 2022-10-31 ENCOUNTER — Encounter: Payer: Medicare Other | Admitting: Family Medicine

## 2022-10-31 ENCOUNTER — Telehealth: Payer: Self-pay | Admitting: Family Medicine

## 2022-10-31 ENCOUNTER — Other Ambulatory Visit: Payer: Self-pay | Admitting: Family Medicine

## 2022-10-31 DIAGNOSIS — R0981 Nasal congestion: Secondary | ICD-10-CM

## 2022-10-31 DIAGNOSIS — R059 Cough, unspecified: Secondary | ICD-10-CM

## 2022-10-31 MED ORDER — PREDNISONE 20 MG PO TABS
40.0000 mg | ORAL_TABLET | Freq: Every day | ORAL | 0 refills | Status: AC
Start: 2022-10-31 — End: 2022-11-03

## 2022-10-31 NOTE — Telephone Encounter (Signed)
Please review and advise.

## 2022-10-31 NOTE — Telephone Encounter (Signed)
Patient notified and verified understanding.

## 2022-10-31 NOTE — Telephone Encounter (Signed)
  Incoming Patient Call  10/31/2022  What symptoms do you have? Still has sinus infection, coughing and congestion and wants more prednisone. She is better but wants more medication.  How long have you been sick? 10-9  Have you been seen for this problem? YES with Marcelino Duster on 10-9  If your provider decides to give you a prescription, which pharmacy would you like for it to be sent to? [facility] [place].  Patient informed that this information will be sent to the clinical staff for review and that they should receive a follow up call.

## 2022-10-31 NOTE — Telephone Encounter (Signed)
Will do a three day course of prednisone. If this is not beneficial, she will need to be reevaluated.

## 2022-11-01 ENCOUNTER — Telehealth: Payer: Self-pay | Admitting: Family Medicine

## 2022-11-01 NOTE — Telephone Encounter (Signed)
Patient aware to call [facility] & get them to transfer to other pharmacy.

## 2022-11-01 NOTE — Telephone Encounter (Signed)
Please send predniSONE (DELTASONE) 20 MG tablet  to [facility]

## 2022-11-15 ENCOUNTER — Ambulatory Visit: Payer: Medicare Other | Admitting: Nurse Practitioner

## 2022-11-15 ENCOUNTER — Encounter: Payer: Self-pay | Admitting: Nurse Practitioner

## 2022-11-15 VITALS — BP 162/87 | HR 63 | Temp 97.7°F | Ht 59.0 in | Wt 146.2 lb

## 2022-11-15 DIAGNOSIS — R0981 Nasal congestion: Secondary | ICD-10-CM

## 2022-11-15 DIAGNOSIS — R051 Acute cough: Secondary | ICD-10-CM

## 2022-11-15 LAB — VERITOR FLU A/B WAIVED
Influenza A: NEGATIVE
Influenza B: NEGATIVE

## 2022-11-15 LAB — RSV AG, IMMUNOCHR, WAIVED: RSV Ag, Immunochr, Waived: NEGATIVE

## 2022-11-15 NOTE — Progress Notes (Signed)
Acute Office Visit  Subjective:     Patient ID: Nicole Aguirre, female    DOB: 1950/03/24, 72 y.o.   MRN: [id number]  Chief Complaint  Patient presents with   Nasal Congestion    Has been sick since beginning of month was given antibiotic but is not getting any better    Cough    HPI Sinus Pain: She was seen and treated with Prednisone and amoxicillin and advise to get OTC cough suppression. Finish the course of treatment and t a few day ago started experiencing  bilateral ear pressure/pain, congestion, nasal congestion, post nasal drip, and sinus pressure. Symptoms include congestion and productive cough with  clear colored sputum with chills. Onset of symptoms was 5 days ago, unchanged since that time. She is drinking moderate amounts of fluids.  Past history is significant for no history of pneumonia or bronchitis. Patient is non-smoker. Hx of allergic rhinitis currently on singular, Allegra POC COVID< FLU, RSV order Flu and RSV negative waiting fro COVID  Active Ambulatory Problems    Diagnosis Date Noted   B12 deficiency 05/06/2015   Essential hypertension 02/22/2018   GERD (gastroesophageal reflux disease) 03/01/2018   Mixed hyperlipidemia 02/22/2018   Anxiety 03/01/2018   PVC (premature ventricular contraction) 03/03/2018   Fibromyalgia 04/16/2019   DDD (degenerative disc disease), lumbar 03/30/2020   Right hip pain 03/30/2020   Seasonal allergies 10/26/2020   Allergic rhinitis 03/14/2021   Chest pain 03/14/2021   Sinusitis 03/14/2021   Irritable bowel syndrome with diarrhea 12/19/2021   Vitamin D deficiency 12/19/2021   Resolved Ambulatory Problems    Diagnosis Date Noted   Allergy 03/01/2018   Trochanteric bursitis of right hip 09/05/2018   Subacute frontal sinusitis 11/09/2020   Past Medical History:  Diagnosis Date   Hyperlipidemia    Hypertension      Review of Systems  Constitutional:  Positive for chills. Negative for fever and malaise/fatigue.   HENT:  Positive for sinus pain. Negative for sore throat.   Respiratory:  Positive for cough and sputum production. Negative for shortness of breath and wheezing.        Clear sputum  Cardiovascular:  Negative for chest pain and leg swelling.  Gastrointestinal:  Negative for nausea and vomiting.  Genitourinary:  Negative for frequency and urgency.  Musculoskeletal:  Negative for falls and myalgias.  Skin:  Negative for itching and rash.  Neurological:  Negative for dizziness and headaches.  Endo/Heme/Allergies:  Negative for environmental allergies and polydipsia. Does not bruise/bleed easily.  Psychiatric/Behavioral:  The patient does not have insomnia.    Negative unless indicated in HPI    Objective:    BP (!) 162/87   Pulse 63   Temp 97.7 F (36.5 C) (Temporal)   Ht 4\' 11"  (1.499 m)   Wt 146 lb 3.2 oz (66.3 kg)   SpO2 99%   BMI 29.53 kg/m  BP Readings from Last 3 Encounters:  11/15/22 (!) 162/87  10/25/22 (!) 176/102  05/25/22 (!) 147/81   Wt Readings from Last 3 Encounters:  11/15/22 146 lb 3.2 oz (66.3 kg)  10/25/22 145 lb 6.4 oz (66 kg)  05/25/22 153 lb 3.2 oz (69.5 kg)      Physical Exam Vitals and nursing note reviewed.  Constitutional:      Appearance: Normal appearance.  HENT:     Head: Normocephalic and atraumatic.     Nose: Congestion and rhinorrhea present.     Right Sinus: No maxillary sinus tenderness or frontal  sinus tenderness.     Left Sinus: No maxillary sinus tenderness or frontal sinus tenderness.     Mouth/Throat:     Lips: Pink.     Palate: No mass and lesions.     Pharynx: Oropharynx is clear. Postnasal drip present. No posterior oropharyngeal erythema.  Eyes:     Extraocular Movements: Extraocular movements intact.     Conjunctiva/sclera: Conjunctivae normal.     Pupils: Pupils are equal, round, and reactive to light.  Cardiovascular:     Rate and Rhythm: Normal rate and regular rhythm.  Pulmonary:     Effort: Pulmonary effort is  normal.     Breath sounds: Normal breath sounds.  Musculoskeletal:        General: Normal range of motion.     Right lower leg: No edema.     Left lower leg: No edema.  Skin:    General: Skin is warm and dry.     Findings: No rash.  Neurological:     Mental Status: She is alert and oriented to person, place, and time. Mental status is at baseline.  Psychiatric:        Mood and Affect: Mood normal.        Behavior: Behavior normal.        Thought Content: Thought content normal.        Judgment: Judgment normal.     No results found for any visits on 11/15/22.      Assessment & Plan:  Acute cough -     Novel Coronavirus, NAA ([facility]) -     Veritor Flu A/B Waived -     RSV Ag, Immunochr, Waived  Nasal congestion -     Novel Coronavirus, NAA ([facility]) -     Veritor Flu A/B Waived -     RSV Ag, Immunochr, Waived  [place] is a 72 yrs old female, no acute distress Flu, RSV negative; awaiting Covid result 1. Continue OTC Coricidin, add Flonase for nasal congestion 2. Use a cool mist humidifier especially during the winter months and when heat has been humid. 3. Use saline nose sprays frequently. 4. Saline irrigations of the nose can be very helpful if done frequently. Use 4 times daily for one week. Can use Nettie Pot if able to tolerate.  5. Drink plenty of fluids 6. Keep thermostat turned down low. 7.For any cough or congestion: Use plain Mucinex- regular strength or max strength is fine. 8. For fever or aces or pains- take tylenol or ibuprofen appropriate for age and weight, for fevers greater than 101 orally you may alternate ibuprofen and tylenol every 3 hours.  Encourage  healthy lifestyle choices, including diet (rich in fruits, vegetables, and lean proteins, and low in salt and simple carbohydrates) and exercise (at least 30 minutes of moderate physical activity daily).     The above assessment and management plan was discussed with the patient. The patient  verbalized understanding of and has agreed to the management plan. Patient is aware to call the clinic if they develop any new symptoms or if symptoms persist or worsen. Patient is aware when to return to the clinic for a follow-up visit. Patient educated on when it is appropriate to go to the emergency department.   Return if symptoms worsen or fail to improve.  Arrie Aran Santa Lighter, DNP [facility] [address] [place], [place] [address] [phone]

## 2022-11-16 LAB — NOVEL CORONAVIRUS, NAA: SARS-CoV-2, NAA: NOT DETECTED

## 2022-11-17 ENCOUNTER — Other Ambulatory Visit: Payer: Self-pay | Admitting: Family Medicine

## 2022-11-17 ENCOUNTER — Telehealth: Payer: Self-pay | Admitting: Family Medicine

## 2022-11-17 DIAGNOSIS — I10 Essential (primary) hypertension: Secondary | ICD-10-CM

## 2022-11-17 MED ORDER — LISINOPRIL 20 MG PO TABS
20.0000 mg | ORAL_TABLET | Freq: Every day | ORAL | 3 refills | Status: DC
Start: 2022-11-17 — End: 2023-06-28

## 2022-11-17 NOTE — Telephone Encounter (Signed)
Happy Birthday!!  You can increase your lisinopril to 20 mg daily. Avoid sodium in diet and drinks.  Continue to monitor BP and follow up in office as scheduled.

## 2022-11-17 NOTE — Telephone Encounter (Signed)
Attempted to contact- Vm not accepting calls

## 2022-11-20 NOTE — Telephone Encounter (Signed)
Patient aware and verbalizes understanding. 

## 2022-12-07 ENCOUNTER — Encounter: Payer: Self-pay | Admitting: Family Medicine

## 2022-12-07 ENCOUNTER — Ambulatory Visit: Payer: Medicare Other | Admitting: Family Medicine

## 2022-12-07 VITALS — BP 150/76 | HR 69 | Temp 98.0°F | Ht 59.0 in | Wt 145.6 lb

## 2022-12-07 DIAGNOSIS — J069 Acute upper respiratory infection, unspecified: Secondary | ICD-10-CM

## 2022-12-07 MED ORDER — BENZONATATE 100 MG PO CAPS: 100.0000 mg | ORAL_CAPSULE | Freq: Three times a day (TID) | ORAL | 0 refills | Status: DC | PRN

## 2022-12-07 MED ORDER — GUAIFENESIN ER 600 MG PO TB12: 600.0000 mg | ORAL_TABLET | Freq: Two times a day (BID) | ORAL | 0 refills | Status: AC

## 2022-12-07 MED ORDER — DOXYCYCLINE HYCLATE 100 MG PO TABS: 100.0000 mg | ORAL_TABLET | Freq: Two times a day (BID) | ORAL | 0 refills | Status: AC

## 2022-12-07 NOTE — Progress Notes (Signed)
Subjective:  Patient ID: Nicole Aguirre, female    DOB: 12-27-1950, 72 y.o.   MRN: [id number]  Patient Care Team: Sonny Masters, FNP as PCP - General (Family Medicine)   Chief Complaint:  Cough (Drainage x 2 mths )   HPI: Nicole Aguirre is a 72 y.o. female presenting on 12/07/2022 for Cough (Drainage x 2 mths )   Discussed the use of AI scribe software for clinical note transcription with the patient, who gave verbal consent to proceed.  History of Present Illness   The patient, a 72 year old with a prolonged history of upper respiratory symptoms, reports persistent cough and congestion despite adherence to prescribed medications, including Coricidin, Delsym cough medicine, augmentin, and a course of steroids. She describes the nasal discharge as thick and white, while the sputum produced from coughing is yellow.  The patient denies fever but reports experiencing chills. She also mentions occasional night sweats, which she considers normal for her age. There is no reported confusion or decreased urine output, and the patient's appetite and hydration status remain stable.  The patient's last course of antibiotics was Amoxicillin Clavulanate and Prednisone, prescribed by the same provider. Despite completing the course, the patient's symptoms have persisted for approximately one and a half to two months.         Relevant past medical, surgical, family, and social history reviewed and updated as indicated.  Allergies and medications reviewed and updated. Data reviewed: Chart in Epic.   Past Medical History:  Diagnosis Date   Allergy    Fibromyalgia    GERD (gastroesophageal reflux disease)    Hyperlipidemia    Hypertension    PVC (premature ventricular contraction)     Past Surgical History:  Procedure Laterality Date   ABDOMINAL HYSTERECTOMY     APPENDECTOMY  1985   CATARACT EXTRACTION, BILATERAL     CHOLECYSTECTOMY  2010    Social History    Socioeconomic History   Marital status: Married    Spouse name: Loraine Leriche   Number of children: 2   Years of education: 10th   Highest education level: GED or equivalent  Occupational History   Occupation: Dietary    Comment: Chief Executive Officer   Occupation: retired  Tobacco Use   Smoking status: Former    Current packs/day: 0.00    Average packs/day: 1 pack/day for 4.0 years (4.0 ttl pk-yrs)    Types: Cigarettes    Start date: 07/25/1972    Quit date: 07/25/1976    Years since quitting: 46.4   Smokeless tobacco: Never  Vaping Use   Vaping status: Never Used  Substance and Sexual Activity   Alcohol use: Never   Drug use: Never   Sexual activity: Yes    Birth control/protection: Surgical  Other Topics Concern   Not on file  Social History Narrative   2 sons   5 grandchildren.   Social Determinants of Health   Financial Resource Strain: Low Risk  (03/30/2022)   Overall Financial Resource Strain (CARDIA)    Difficulty of Paying Living Expenses: Not hard at all  Food Insecurity: No Food Insecurity (03/30/2022)   Hunger Vital Sign    Worried About Running Out of Food in the Last Year: Never true    Ran Out of Food in the Last Year: Never true  Transportation Needs: No Transportation Needs (03/30/2022)   PRAPARE - Administrator, Civil Service (Medical): No    Lack of Transportation (Non-Medical): No  Physical Activity: Insufficiently  Active (03/30/2022)   Exercise Vital Sign    Days of Exercise per Week: 3 days    Minutes of Exercise per Session: 30 min  Stress: No Stress Concern Present (03/30/2022)   [employer] of Occupational Health - Occupational Stress Questionnaire    Feeling of Stress : Not at all  Social Connections: Moderately Integrated (03/30/2022)   Social Connection and Isolation Panel [NHANES]    Frequency of Communication with Friends and Family: More than three times a week    Frequency of Social Gatherings with Friends and Family: More than three  times a week    Attends Religious Services: More than 4 times per year    Active Member of [employer] or Organizations: No    Attends Banker Meetings: Never    Marital Status: Married  Catering manager Violence: Not At Risk (03/30/2022)   Humiliation, Afraid, Rape, and Kick questionnaire    Fear of Current or Ex-Partner: No    Emotionally Abused: No    Physically Abused: No    Sexually Abused: No    Outpatient Encounter Medications as of 12/07/2022  Medication Sig   albuterol (VENTOLIN HFA) 108 (90 Base) MCG/ACT inhaler Inhale 2 puffs into the lungs every 6 (six) hours as needed.   atorvastatin (LIPITOR) 40 MG tablet TAKE 1 TABLET BY MOUTH AT  BEDTIME   benzonatate (TESSALON PERLES) 100 MG capsule Take 1 capsule (100 mg total) by mouth 3 (three) times daily as needed for cough.   diclofenac Sodium (VOLTAREN) 1 % GEL APPLY 2 GRAMS TO AFFECTED AREA 4 TIMES A DAY   doxycycline (VIBRA-TABS) 100 MG tablet Take 1 tablet (100 mg total) by mouth 2 (two) times daily for 10 days. 1 po bid   fexofenadine (ALLEGRA) 180 MG tablet TAKE 1 TABLET BY MOUTH EVERY DAY   fluticasone (FLONASE) 50 MCG/ACT nasal spray USE 2 SPRAYS IN BOTH NOSTRILS  DAILY   gabapentin (NEURONTIN) 300 MG capsule TAKE 1 CAPSULE BY MOUTH 3 TIMES  DAILY   guaiFENesin (MUCINEX) 600 MG 12 hr tablet Take 1 tablet (600 mg total) by mouth 2 (two) times daily for 5 days.   hyoscyamine (LEVSIN) 0.125 MG tablet Take 1 tablet (0.125 mg total) by mouth every 4 (four) hours as needed for cramping.   lisinopril (ZESTRIL) 20 MG tablet Take 1 tablet (20 mg total) by mouth daily.   loperamide (IMODIUM) 2 MG capsule TAKE 1 CAPSULE BY MOUTH 4 TIMES  DAILY AS NEEDED FOR DIARRHEA OR  LOOSE STOOLS   Magnesium 200 MG TABS Take 1 tablet (200 mg total) by mouth daily.   metoprolol succinate (TOPROL-XL) 25 MG 24 hr tablet TAKE 1/2 TABLET BY MOUTH DAILY   montelukast (SINGULAIR) 10 MG tablet TAKE 1 TABLET BY MOUTH DAILY   omeprazole (PRILOSEC) 40  MG capsule TAKE 1 CAPSULE BY MOUTH DAILY   ondansetron (ZOFRAN) 4 MG tablet TAKE 1 TABLET BY MOUTH EVERY 8  HOURS AS NEEDED FOR NAUSEA AND  VOMITING   Vitamin D, Ergocalciferol, (DRISDOL) 1.25 MG (50000 UNIT) CAPS capsule TAKE 1 CAPSULE BY MOUTH EVERY 7  DAYS   No facility-administered encounter medications on file as of 12/07/2022.    Allergies  Allergen Reactions   Hydrochlorothiazide Other (See Comments)    cramping  cramping cramping    cramping    Pertinent ROS per HPI, otherwise unremarkable      Objective:  BP (!) 150/76   Pulse 69   Temp 98 F (36.7 C) (Temporal)  Ht 4\' 11"  (1.499 m)   Wt 145 lb 9.6 oz (66 kg)   SpO2 98%   BMI 29.41 kg/m    Wt Readings from Last 3 Encounters:  12/07/22 145 lb 9.6 oz (66 kg)  11/15/22 146 lb 3.2 oz (66.3 kg)  10/25/22 145 lb 6.4 oz (66 kg)    Physical Exam Vitals and nursing note reviewed.  Constitutional:      General: She is not in acute distress.    Appearance: Normal appearance. She is well-developed and well-groomed. She is not ill-appearing, toxic-appearing or diaphoretic.  HENT:     Head: Normocephalic and atraumatic.     Jaw: There is normal jaw occlusion.     Right Ear: Hearing, ear canal and external ear normal. A middle ear effusion is present. Tympanic membrane is not erythematous.     Left Ear: Hearing, ear canal and external ear normal. A middle ear effusion is present. Tympanic membrane is not erythematous.     Nose: Congestion present.     Mouth/Throat:     Lips: Pink.     Mouth: Mucous membranes are moist.     Pharynx: Oropharynx is clear. Uvula midline. Posterior oropharyngeal erythema and postnasal drip present. No oropharyngeal exudate.  Eyes:     General: Lids are normal.     Extraocular Movements: Extraocular movements intact.     Conjunctiva/sclera: Conjunctivae normal.     Pupils: Pupils are equal, round, and reactive to light.  Neck:     Thyroid: No thyroid mass, thyromegaly or thyroid  tenderness.     Vascular: No carotid bruit or JVD.     Trachea: Trachea and phonation normal.  Cardiovascular:     Rate and Rhythm: Normal rate and regular rhythm.     Chest Wall: PMI is not displaced.     Pulses: Normal pulses.     Heart sounds: Normal heart sounds. No murmur heard.    No friction rub. No gallop.  Pulmonary:     Effort: Pulmonary effort is normal. No respiratory distress.     Breath sounds: Normal breath sounds. No wheezing.     Comments: Dry cough Abdominal:     General: Bowel sounds are normal. There is no distension or abdominal bruit.     Palpations: Abdomen is soft. There is no hepatomegaly or splenomegaly.     Tenderness: There is no abdominal tenderness. There is no right CVA tenderness or left CVA tenderness.     Hernia: No hernia is present.  Musculoskeletal:        General: Normal range of motion.     Cervical back: Normal range of motion and neck supple.     Right lower leg: No edema.     Left lower leg: No edema.  Lymphadenopathy:     Cervical: No cervical adenopathy.  Skin:    General: Skin is warm and dry.     Capillary Refill: Capillary refill takes less than 2 seconds.     Coloration: Skin is not cyanotic, jaundiced or pale.     Findings: No rash.  Neurological:     General: No focal deficit present.     Mental Status: She is alert and oriented to person, place, and time.     Sensory: Sensation is intact.     Motor: Motor function is intact.     Coordination: Coordination is intact.     Gait: Gait is intact.     Deep Tendon Reflexes: Reflexes are normal and symmetric.  Psychiatric:        Attention and Perception: Attention and perception normal.        Mood and Affect: Mood and affect normal.        Speech: Speech normal.        Behavior: Behavior normal. Behavior is cooperative.        Thought Content: Thought content normal.        Cognition and Memory: Cognition and memory normal.        Judgment: Judgment normal.       Results  for orders placed or performed in visit on 11/15/22  Novel Coronavirus, NAA ([facility])   Specimen: Nasopharyngeal(NP) swabs in vial transport medium  Result Value Ref Range   SARS-CoV-2, NAA Not Detected Not Detected  RSV Ag, Immunochr, Waived   Specimen: Nasopharyngeal   Naso  Result Value Ref Range   RSV Ag, Immunochr, Waived Negative Negative  Veritor Flu A/B Waived  Result Value Ref Range   Influenza A Negative Negative   Influenza B Negative Negative       Pertinent labs & imaging results that were available during my care of the patient were reviewed by me and considered in my medical decision making.  Assessment & Plan:  [place] was seen today for cough.  Diagnoses and all orders for this visit:  URI with cough and congestion -     doxycycline (VIBRA-TABS) 100 MG tablet; Take 1 tablet (100 mg total) by mouth 2 (two) times daily for 10 days. 1 po bid -     benzonatate (TESSALON PERLES) 100 MG capsule; Take 1 capsule (100 mg total) by mouth 3 (three) times daily as needed for cough. -     guaiFENesin (MUCINEX) 600 MG 12 hr tablet; Take 1 tablet (600 mg total) by mouth 2 (two) times daily for 5 days.     Assessment and Plan    Upper Respiratory Tract Infection Persistent upper respiratory symptoms including cough, congestion, and thick white nasal discharge for approximately 1.5 to 2 months. Symptoms include yellow sputum with brown specks, chills, and mild night sweats. No fever, confusion, or decreased urine output. Previous treatment with amoxicillin-clavulanate and prednisone was insufficient. Current symptoms suggest a bacterial component. Discussed the use of doxycycline for 10 days, which is typically effective for bacterial infections. Explained that benzonatate can be used up to three times a day for cough relief. Mucinex with lots of water will help break up mucus. Advised to continue using Delsym and inhaler as needed. Emphasized the importance of using local honey  with a plastic or wood spoon to avoid killing spores. Informed to call if not feeling better by Tuesday. - Prescribe doxycycline 100 mg twice daily for 10 days - Prescribe benzonatate up to three times a day as needed for cough - Prescribe Mucinex with lots of water for 5 days to help break up mucus - Continue using Delsym as needed - Continue using inhaler as needed - Advise to use local honey with a plastic or wood spoon - Advise to call if not feeling better by Tuesday.          Continue all other maintenance medications.  Follow up plan: Return if symptoms worsen or fail to improve.   Continue healthy lifestyle choices, including diet (rich in fruits, vegetables, and lean proteins, and low in salt and simple carbohydrates) and exercise (at least 30 minutes of moderate physical activity daily).  Educational handout given for URI  The above assessment and  management plan was discussed with the patient. The patient verbalized understanding of and has agreed to the management plan. Patient is aware to call the clinic if they develop any new symptoms or if symptoms persist or worsen. Patient is aware when to return to the clinic for a follow-up visit. Patient educated on when it is appropriate to go to the emergency department.   Kari Baars, FNP-C [facility] [phone]

## 2022-12-18 ENCOUNTER — Telehealth: Payer: Self-pay | Admitting: Family Medicine

## 2022-12-18 NOTE — Telephone Encounter (Signed)
Refer to other phone message  

## 2022-12-18 NOTE — Telephone Encounter (Signed)
Copied from CRM [id number]. Topic: Clinical - Medical Advice >> Dec 18, 2022 12:18 PM Colletta Maryland S wrote: Reason for CRM: pt states after finishing antibiotics she still has upper respiratory infection and is wondering how to proceed

## 2022-12-18 NOTE — Telephone Encounter (Signed)
Spoke with patient and she states that she is better but still has a cough, nasal congestion and coughing up mucus. She finished abx & tessalon pearls yesterday.  Would like to know if she needs something else or just more tessalon pearls? Aware you are out of the office and will get a call back tomorrow.  Denies any SOB or trouble breathing.

## 2022-12-18 NOTE — Telephone Encounter (Signed)
Copied from CRM #[id number]. Topic: Clinical - Medication Refill >> Dec 18, 2022 12:16 PM Conni Elliot wrote: Most Recent Primary Care Visit:  Provider: Sonny Masters  Department: [facility]  Visit Type: OFFICE VISIT  Date: 12/07/2022  Medication: albuterol (VENTOLIN HFA) 108 (90 Base) MCG/ACT inhaler, benzonatate (TESSALON PERLES) 100 MG capsule  Has the patient contacted their pharmacy? No (Agent: If no, request that the patient contact the pharmacy for the refill. If patient does not wish to contact the pharmacy document the reason why and proceed with request.) (Agent: If yes, when and what did the pharmacy advise?)  Is this the correct pharmacy for this prescription? Yes If no, delete pharmacy and type the correct one.  This is the patient's preferred pharmacy:  [id number] - [place], [place] - [address] [place] [address] Phone: [phone] Fax: [phone]  [facility]) - [place], [place] - [address] [facility] [address] [place] [address] [place] [address] Phone: [phone] Fax: [phone]  [facility] [phone] - [place], [place] - [address] [place] [address] Phone: [phone] Fax: [phone]  [facility] - [place], [place] - [address] [place] [address] Phone: [phone] Fax: [phone]  [id number] [place], [place] - [address] [place] [address] Phone: [phone] Fax: [phone]   Has the prescription been filled recently? Yes  Is the patient out of the medication? Yes  Has the patient been seen for an appointment in the last year OR does the patient have an upcoming appointment? Yes  Can we respond through MyChart? Yes  Agent: Please be advised that Rx refills may take up to 3 business days. We ask that you follow-up with your pharmacy.

## 2022-12-28 ENCOUNTER — Other Ambulatory Visit: Payer: Self-pay | Admitting: Family Medicine

## 2022-12-28 DIAGNOSIS — I10 Essential (primary) hypertension: Secondary | ICD-10-CM

## 2022-12-28 DIAGNOSIS — T7840XS Allergy, unspecified, sequela: Secondary | ICD-10-CM

## 2023-01-01 NOTE — Telephone Encounter (Signed)
NA °No call back  °This encounter will be closed  °

## 2023-02-12 DIAGNOSIS — R519 Headache, unspecified: Secondary | ICD-10-CM | POA: Diagnosis not present

## 2023-02-12 DIAGNOSIS — I493 Ventricular premature depolarization: Secondary | ICD-10-CM | POA: Diagnosis not present

## 2023-02-12 DIAGNOSIS — E782 Mixed hyperlipidemia: Secondary | ICD-10-CM | POA: Diagnosis not present

## 2023-02-12 DIAGNOSIS — I10 Essential (primary) hypertension: Secondary | ICD-10-CM | POA: Diagnosis not present

## 2023-02-18 ENCOUNTER — Other Ambulatory Visit: Payer: Self-pay | Admitting: Family Medicine

## 2023-02-18 DIAGNOSIS — K219 Gastro-esophageal reflux disease without esophagitis: Secondary | ICD-10-CM

## 2023-02-18 DIAGNOSIS — E782 Mixed hyperlipidemia: Secondary | ICD-10-CM

## 2023-02-20 ENCOUNTER — Ambulatory Visit (INDEPENDENT_AMBULATORY_CARE_PROVIDER_SITE_OTHER): Payer: Medicare Other

## 2023-02-20 ENCOUNTER — Ambulatory Visit: Payer: Medicare Other | Admitting: Family Medicine

## 2023-02-20 ENCOUNTER — Encounter: Payer: Self-pay | Admitting: Family Medicine

## 2023-02-20 ENCOUNTER — Encounter: Payer: Medicare Other | Admitting: Family Medicine

## 2023-02-20 ENCOUNTER — Telehealth: Payer: Self-pay | Admitting: Family Medicine

## 2023-02-20 VITALS — BP 128/76 | HR 63 | Temp 97.6°F | Ht 59.0 in | Wt 145.8 lb

## 2023-02-20 DIAGNOSIS — Z1231 Encounter for screening mammogram for malignant neoplasm of breast: Secondary | ICD-10-CM | POA: Diagnosis not present

## 2023-02-20 DIAGNOSIS — Z0000 Encounter for general adult medical examination without abnormal findings: Secondary | ICD-10-CM

## 2023-02-20 DIAGNOSIS — Z23 Encounter for immunization: Secondary | ICD-10-CM

## 2023-02-20 DIAGNOSIS — E538 Deficiency of other specified B group vitamins: Secondary | ICD-10-CM

## 2023-02-20 DIAGNOSIS — Z780 Asymptomatic menopausal state: Secondary | ICD-10-CM | POA: Diagnosis not present

## 2023-02-20 DIAGNOSIS — Z0001 Encounter for general adult medical examination with abnormal findings: Secondary | ICD-10-CM | POA: Diagnosis not present

## 2023-02-20 DIAGNOSIS — L821 Other seborrheic keratosis: Secondary | ICD-10-CM | POA: Diagnosis not present

## 2023-02-20 DIAGNOSIS — K580 Irritable bowel syndrome with diarrhea: Secondary | ICD-10-CM

## 2023-02-20 DIAGNOSIS — Z13820 Encounter for screening for osteoporosis: Secondary | ICD-10-CM

## 2023-02-20 DIAGNOSIS — E782 Mixed hyperlipidemia: Secondary | ICD-10-CM

## 2023-02-20 DIAGNOSIS — K219 Gastro-esophageal reflux disease without esophagitis: Secondary | ICD-10-CM

## 2023-02-20 DIAGNOSIS — R6889 Other general symptoms and signs: Secondary | ICD-10-CM | POA: Diagnosis not present

## 2023-02-20 DIAGNOSIS — I10 Essential (primary) hypertension: Secondary | ICD-10-CM

## 2023-02-20 DIAGNOSIS — E559 Vitamin D deficiency, unspecified: Secondary | ICD-10-CM

## 2023-02-20 LAB — LIPID PANEL

## 2023-02-20 MED ORDER — PANTOPRAZOLE SODIUM 40 MG PO TBEC
40.0000 mg | DELAYED_RELEASE_TABLET | Freq: Every day | ORAL | 3 refills | Status: DC
Start: 2023-02-20 — End: 2023-04-24

## 2023-02-20 NOTE — Telephone Encounter (Signed)
Copied from CRM [id number]. Topic: Clinical - Medical Advice >> Feb 20, 2023 12:08 PM Ivette P wrote: Reason for CRM: Pt would like a nurse to call and go over medication that can help for Immune system issues. PT [phone].

## 2023-02-20 NOTE — Patient Instructions (Signed)
CeraVe [employer]

## 2023-02-20 NOTE — Progress Notes (Addendum)
 Complete physical exam  Patient: Nicole Aguirre   DOB: 1950/03/31   73 y.o. Female  MRN: [id number]  Subjective:    Chief Complaint  Patient presents with  . Annual Exam    Nicole Aguirre is a 73 y.o. female who presents today for a complete physical exam. She reports consuming a general diet. The patient does not participate in regular exercise at present. She generally feels well. She reports sleeping well. She does not have additional problems to discuss today.   Discussed the use of AI scribe software for clinical note transcription with the patient, who gave verbal consent to proceed.  History of Present Illness   Nicole  Aguirre is a 73 year old female who presents for an annual physical exam.  She recently underwent a colonoscopy where polyps and diverticula were removed, and mild diverticulosis was noted. No cancer was found, and an endoscopy was deemed unnecessary.  She has a history of chronic cough, which has resolved, but she continues to experience intermittent diarrhea and significant gas over the past few days. She has been on omeprazole  for years and is considering a change in medication due to persistent acid reflux symptoms, though she has not tracked her frequency. No changes in bowel habits other than diarrhea, and no blood in urine or stool.  Her hypertension is managed with lisinopril , which was recently increased due to high blood pressure. She believes this adjustment is effective, as her blood pressure was 128 mmHg today. She was prescribed hydralazine but only took one dose due to apprehension. She regularly monitors her blood pressure and is scheduled for blood work next week to assess renal function.  She experiences bothersome wrist pain, for which she uses a wrist brace and cream. The pain has improved but persists.  She mentions keratin overgrowths on her skin, which are not bothersome, and she applies Neosporin to them. No changes in hair,  skin, or nails except for these overgrowths.  She is focused on building her immune system by taking a multivitamin and is considering collagen supplements. She has been experiencing frequent infections and is looking for ways to improve her health.       Most recent fall risk assessment:    11/15/2022    9:50 AM  Fall Risk   Falls in the past year? 0  Number falls in past yr: 0  Injury with Fall? 0  Risk for fall due to : No Fall Risks  Follow up Falls evaluation completed     Most recent depression screenings:    12/07/2022   11:58 AM 11/15/2022    9:50 AM  PHQ 2/9 Scores  PHQ - 2 Score 0 0  PHQ- 9 Score 0 0    Vision:Within last year and Dental: No current dental problems and Receives regular dental care  Patient Active Problem List   Diagnosis Date Noted  . Irritable bowel syndrome with diarrhea 12/19/2021  . Vitamin D  deficiency 12/19/2021  . Allergic rhinitis 03/14/2021  . Chest pain 03/14/2021  . Sinusitis 03/14/2021  . Seasonal allergies 10/26/2020  . DDD (degenerative disc disease), lumbar 03/30/2020  . Right hip pain 03/30/2020  . Fibromyalgia 04/16/2019  . PVC (premature ventricular contraction) 03/03/2018  . GERD (gastroesophageal reflux disease) 03/01/2018  . Anxiety 03/01/2018  . Essential hypertension 02/22/2018  . Mixed hyperlipidemia 02/22/2018  . B12 deficiency 05/06/2015   Past Medical History:  Diagnosis Date  . Allergy   . Fibromyalgia   . GERD (gastroesophageal reflux disease)   .  Hyperlipidemia   . Hypertension   . PVC (premature ventricular contraction)    Past Surgical History:  Procedure Laterality Date  . ABDOMINAL HYSTERECTOMY    . APPENDECTOMY  1985  . CATARACT EXTRACTION, BILATERAL    . CHOLECYSTECTOMY  2010   Social History   Tobacco Use  . Smoking status: Former    Current packs/day: 0.00    Average packs/day: 1 pack/day for 4.0 years (4.0 ttl pk-yrs)    Types: Cigarettes    Start date: 07/25/1972    Quit date:  07/25/1976    Years since quitting: 46.6  . Smokeless tobacco: Never  Vaping Use  . Vaping status: Never Used  Substance Use Topics  . Alcohol use: Never  . Drug use: Never   Social History   Socioeconomic History  . Marital status: Married    Spouse name: Oneil  . Number of children: 2  . Years of education: 10th  . Highest education level: GED or equivalent  Occupational History  . Occupation: Dietary    Comment: [employer]  . Occupation: retired  Tobacco Use  . Smoking status: Former    Current packs/day: 0.00    Average packs/day: 1 pack/day for 4.0 years (4.0 ttl pk-yrs)    Types: Cigarettes    Start date: 07/25/1972    Quit date: 07/25/1976    Years since quitting: 46.6  . Smokeless tobacco: Never  Vaping Use  . Vaping status: Never Used  Substance and Sexual Activity  . Alcohol use: Never  . Drug use: Never  . Sexual activity: Yes    Birth control/protection: Surgical  Other Topics Concern  . Not on file  Social History Narrative   2 sons   5 grandchildren.   Social Drivers of Corporate Investment Banker Strain: Low Risk  (02/12/2023)   Received [facility]   Overall Financial Resource Strain (CARDIA)   . Difficulty of Paying Living Expenses: Not hard at all  Food Insecurity: No Food Insecurity (02/12/2023)   Received from [facility]   Hunger Vital Sign   . Worried About Programme Researcher, Broadcasting/film/video in the Last Year: Never true   . Ran Out of Food in the Last Year: Never true  Transportation Needs: No Transportation Needs (02/12/2023)   Received from [facility] - Transportation   . Lack of Transportation (Medical): No   . Lack of Transportation (Non-Medical): No  Physical Activity: Insufficiently Active (03/30/2022)   Exercise Vital Sign   . Days of Exercise per Week: 3 days   . Minutes of Exercise per Session: 30 min  Stress: No Stress Concern Present (03/30/2022)   [employer] of Occupational Health - Occupational Stress  Questionnaire   . Feeling of Stress : Not at all  Social Connections: Moderately Integrated (03/30/2022)   Social Connection and Isolation Panel [NHANES]   . Frequency of Communication with Friends and Family: More than three times a week   . Frequency of Social Gatherings with Friends and Family: More than three times a week   . Attends Religious Services: More than 4 times per year   . Active Member of Clubs or Organizations: No   . Attends Banker Meetings: Never   . Marital Status: Married  Catering Manager Violence: Not At Risk (03/30/2022)   Humiliation, Afraid, Rape, and Kick questionnaire   . Fear of Current or Ex-Partner: No   . Emotionally Abused: No   . Physically Abused: No   .  Sexually Abused: No   Family Status  Relation Name Status  . Mother  Deceased  . Father  Deceased  . Sister  Alive  . Sister  Alive  . Sister  Deceased  . Brother  Alive  . Brother  Deceased  . Son  Alive  . Son  Alive  . Neg Hx  (Not Specified)  No partnership data on file   Family History  Problem Relation Age of Onset  . Lupus Mother   . Rheum arthritis Mother   . Lung cancer Mother   . Emphysema Father   . Pancreatic cancer Father   . Lupus Sister   . Lupus Sister   . Lung disease Sister   . Heart disease Brother   . Early death Brother        Died from falling from a tree  . Heart disease Son   . Mental illness Son   . Breast cancer Neg Hx    Allergies  Allergen Reactions  . Hydrochlorothiazide  Other (See Comments)    cramping  cramping cramping    cramping      Patient Care Team: Severa Rock HERO, FNP as PCP - General (Family Medicine)   Outpatient Medications Prior to Visit  Medication Sig  . albuterol  (VENTOLIN  HFA) 108 (90 Base) MCG/ACT inhaler Inhale 2 puffs into the lungs every 6 (six) hours as needed.  . atorvastatin  (LIPITOR) 40 MG tablet TAKE 1 TABLET BY MOUTH AT  BEDTIME  . diclofenac  Sodium (VOLTAREN ) 1 % GEL APPLY 2 GRAMS TO AFFECTED AREA 4  TIMES A DAY  . fexofenadine  (ALLEGRA ) 180 MG tablet TAKE 1 TABLET BY MOUTH EVERY DAY  . fluticasone  (FLONASE ) 50 MCG/ACT nasal spray USE 2 SPRAYS IN BOTH NOSTRILS  DAILY  . gabapentin  (NEURONTIN ) 300 MG capsule TAKE 1 CAPSULE BY MOUTH 3 TIMES  DAILY  . hyoscyamine  (LEVSIN ) 0.125 MG tablet Take 1 tablet (0.125 mg total) by mouth every 4 (four) hours as needed for cramping.  . lisinopril  (ZESTRIL ) 20 MG tablet Take 1 tablet (20 mg total) by mouth daily. (Patient taking differently: Take 20 mg by mouth in the morning and at bedtime.)  . loperamide  (IMODIUM ) 2 MG capsule TAKE 1 CAPSULE BY MOUTH 4 TIMES  DAILY AS NEEDED FOR DIARRHEA OR  LOOSE STOOLS  . Magnesium  200 MG TABS Take 1 tablet (200 mg total) by mouth daily.  . metoprolol  succinate (TOPROL -XL) 25 MG 24 hr tablet TAKE 1/2 TABLET BY MOUTH DAILY  . montelukast  (SINGULAIR ) 10 MG tablet TAKE 1 TABLET BY MOUTH DAILY  . ondansetron  (ZOFRAN ) 4 MG tablet TAKE 1 TABLET BY MOUTH EVERY 8  HOURS AS NEEDED FOR NAUSEA AND  VOMITING  . Vitamin D , Ergocalciferol , (DRISDOL ) 1.25 MG (50000 UNIT) CAPS capsule TAKE 1 CAPSULE BY MOUTH EVERY 7  DAYS  . [DISCONTINUED] omeprazole  (PRILOSEC) 40 MG capsule TAKE 1 CAPSULE BY MOUTH DAILY  . [DISCONTINUED] benzonatate  (TESSALON  PERLES) 100 MG capsule Take 1 capsule (100 mg total) by mouth 3 (three) times daily as needed for cough. (Patient not taking: Reported on 02/20/2023)   No facility-administered medications prior to visit.    ROS per HPI      Objective:     BP 128/76   Pulse 63   Temp 97.6 F (36.4 C) (Temporal)   Ht 4' 11 (1.499 m)   Wt 145 lb 12.8 oz (66.1 kg)   SpO2 98%   BMI 29.45 kg/m  BP Readings from Last 3 Encounters:  02/20/23 128/76  12/07/22 (!) 150/76  11/15/22 (!) 162/87   Wt Readings from Last 3 Encounters:  02/20/23 145 lb 12.8 oz (66.1 kg)  12/07/22 145 lb 9.6 oz (66 kg)  11/15/22 146 lb 3.2 oz (66.3 kg)   SpO2 Readings from Last 3 Encounters:  02/20/23 98%  12/07/22 98%   11/15/22 99%      Physical Exam Vitals and nursing note reviewed.  Constitutional:      General: She is not in acute distress.    Appearance: Normal appearance. She is well-developed, well-groomed and overweight. She is not ill-appearing, toxic-appearing or diaphoretic.  HENT:     Head: Normocephalic and atraumatic.     Jaw: There is normal jaw occlusion.     Right Ear: Hearing, tympanic membrane, ear canal and external ear normal.     Left Ear: Hearing, tympanic membrane, ear canal and external ear normal.     Nose: Nose normal.     Mouth/Throat:     Lips: Pink.     Mouth: Mucous membranes are moist.     Pharynx: Oropharynx is clear. Uvula midline.  Eyes:     General: Lids are normal.     Extraocular Movements: Extraocular movements intact.     Conjunctiva/sclera: Conjunctivae normal.     Pupils: Pupils are equal, round, and reactive to light.  Neck:     Thyroid : No thyroid  mass, thyromegaly or thyroid  tenderness.     Vascular: No carotid bruit or JVD.     Trachea: Trachea and phonation normal.  Cardiovascular:     Rate and Rhythm: Normal rate and regular rhythm.     Chest Wall: PMI is not displaced.     Pulses: Normal pulses.     Heart sounds: Normal heart sounds. No murmur heard.    No friction rub. No gallop.  Pulmonary:     Effort: Pulmonary effort is normal. No respiratory distress.     Breath sounds: Normal breath sounds. No wheezing.  Abdominal:     General: Bowel sounds are normal. There is no distension or abdominal bruit.     Palpations: Abdomen is soft. There is no hepatomegaly or splenomegaly.     Tenderness: There is no abdominal tenderness. There is no right CVA tenderness or left CVA tenderness.     Hernia: No hernia is present.  Musculoskeletal:        General: Normal range of motion.     Cervical back: Normal range of motion and neck supple.     Right lower leg: No edema.     Left lower leg: No edema.  Lymphadenopathy:     Cervical: No cervical  adenopathy.  Skin:    General: Skin is warm and dry.     Capillary Refill: Capillary refill takes less than 2 seconds.     Coloration: Skin is not cyanotic, jaundiced or pale.     Findings: Lesion present. No rash.       Neurological:     General: No focal deficit present.     Mental Status: She is alert and oriented to person, place, and time.     Sensory: Sensation is intact.     Motor: Motor function is intact.     Coordination: Coordination is intact.     Gait: Gait is intact.     Deep Tendon Reflexes: Reflexes are normal and symmetric.  Psychiatric:        Attention and Perception: Attention and perception normal.  Mood and Affect: Mood and affect normal.        Speech: Speech normal.        Behavior: Behavior normal. Behavior is cooperative.        Thought Content: Thought content normal.        Cognition and Memory: Cognition and memory normal.        Judgment: Judgment normal.      Last CBC Lab Results  Component Value Date   WBC 3.8 10/25/2022   HGB 13.6 10/25/2022   HCT 43.3 10/25/2022   MCV 96 10/25/2022   MCH 30.0 10/25/2022   RDW 13.1 10/25/2022   PLT 225 10/25/2022   Last metabolic panel Lab Results  Component Value Date   GLUCOSE 84 10/25/2022   NA 139 10/25/2022   K 4.3 10/25/2022   CL 104 10/25/2022   CO2 23 10/25/2022   BUN 7 (L) 10/25/2022   CREATININE 0.82 10/25/2022   EGFR 77 10/25/2022   CALCIUM  9.5 10/25/2022   PROT 7.1 10/25/2022   ALBUMIN 4.1 10/25/2022   LABGLOB 3.0 10/25/2022   AGRATIO 1.5 05/25/2022   BILITOT 0.5 10/25/2022   ALKPHOS 55 10/25/2022   AST 16 10/25/2022   ALT 11 10/25/2022   Last lipids Lab Results  Component Value Date   CHOL 189 10/25/2022   HDL 70 10/25/2022   LDLCALC 100 (H) 10/25/2022   TRIG 105 10/25/2022   CHOLHDL 2.7 10/25/2022    Last thyroid  functions Lab Results  Component Value Date   TSH 1.570 10/25/2022   T4TOTAL 10.0 10/25/2022   Last vitamin D  Lab Results  Component Value Date    VD25OH 86.8 10/25/2022   Last vitamin B12 and Folate Lab Results  Component Value Date   VITAMINB12 878 12/19/2021        Assessment & Plan:    Routine Health Maintenance and Physical Exam  Immunization History  Administered Date(s) Administered  . Fluad Quad(high Dose 65+) 10/24/2019, 11/08/2020  . Fluad Trivalent(High Dose 65+) 02/20/2023  . Influenza, High Dose Seasonal PF 10/09/2017  . Influenza,inj,Quad PF,6+ Mos 11/15/2016  . Moderna Covid-19 Vaccine Bivalent Booster 65yrs & up 02/24/2021  . Moderna Sars-Covid-2 Vaccination 01/21/2019, 02/18/2019, 11/20/2019, 08/05/2020  . Pneumococcal Conjugate-13 02/13/2017, 02/17/2020  . Tdap 11/22/2010    Health Maintenance  Topic Date Due  . Pneumonia Vaccine 35+ Years old (2 of 2 - PPSV23 or PCV20) 02/16/2021  . DEXA SCAN  02/16/2022  . Medicare Annual Wellness (AWV)  03/30/2023  . COVID-19 Vaccine (6 - 2024-25 season) 03/08/2023 (Originally 09/17/2022)  . Zoster Vaccines- Shingrix (1 of 2) 05/20/2023 (Originally 11/17/1969)  . MAMMOGRAM  06/14/2023  . Colonoscopy  04/29/2025  . INFLUENZA VACCINE  Completed  . Hepatitis C Screening  Completed  . HPV VACCINES  Aged Out  . DTaP/Tdap/Td  Discontinued    Discussed health benefits of physical activity, and encouraged her to engage in regular exercise appropriate for her age and condition.  Problem List Items Addressed This Visit       Cardiovascular and Mediastinum   Essential hypertension   Relevant Orders   Anemia Profile B   CMP14+EGFR   Lipid panel   Thyroid  Panel With TSH     Digestive   GERD (gastroesophageal reflux disease)   Relevant Medications   pantoprazole  (PROTONIX ) 40 MG tablet   Other Relevant Orders   Anemia Profile B   Irritable bowel syndrome with diarrhea   Relevant Medications   pantoprazole  (PROTONIX ) 40 MG tablet  Other   B12 deficiency   Relevant Orders   Anemia Profile B   Mixed hyperlipidemia   Relevant Orders   CMP14+EGFR    Lipid panel   Vitamin D  deficiency   Relevant Orders   VITAMIN D  25 Hydroxy (Vit-D Deficiency, Fractures)   Other Visit Diagnoses       Annual physical exam    -  Primary   Relevant Orders   DG [facility] DEXA   Anemia Profile B   CMP14+EGFR   Lipid panel   Thyroid  Panel With TSH   VITAMIN D  25 Hydroxy (Vit-D Deficiency, Fractures)   MM 3D SCREENING MAMMOGRAM BILATERAL BREAST   Flu Vaccine Trivalent High Dose (Fluad) (Completed)     Encounter for screening mammogram for malignant neoplasm of breast       Relevant Orders   MM 3D SCREENING MAMMOGRAM BILATERAL BREAST     Encounter for immunization       Relevant Orders   Flu Vaccine Trivalent High Dose (Fluad) (Completed)     Screening for osteoporosis       Relevant Orders   DG [facility] DEXA   CMP14+EGFR   VITAMIN D  25 Hydroxy (Vit-D Deficiency, Fractures)     Seborrheic keratoses             Gastroesophageal Reflux Disease (GERD) Intermittent acid reflux symptoms despite long-term omeprazole  use. Discussed switching to pantoprazole  for better symptom control. - Discontinue omeprazole  - Prescribe pantoprazole  (Protonix ) and send prescription to [facility] - Evaluate effectiveness of pantoprazole  and consider long-term prescription through [facility] if effective  Hypertension Blood pressure managed with increased lisinopril  dose. Recent reading 128/xx. Patient declined hydralazine due to side effect concerns. - Continue lisinopril  at increased dose - Order blood work to check renal function - Monitor blood pressure regularly  Diverticulitis Mild diverticulitis confirmed by recent colonoscopy. No current symptoms. Colonoscopy revealed no cancer, some polyps removed, and a few diverticula noted. - Continue current management and monitor for symptoms  Wrist Pain Wrist pain managed with cream and wrist brace. Symptoms improving but still present. Advised continued use of wrist brace, especially at night. - Advise continued use of wrist brace,  especially at night - Reevaluate if no improvement in a few weeks  Seborrheic Keratosis Keratin overgrowths on the back. Benign and not harmful. Discussed treatment options. - Recommend Cerave Rough and Bumpy shower wash and lotion for keratin buildup  General Health Maintenance Annual physical examination. Discussed routine screenings and preventative health measures. Advised on multivitamins and collagen supplements for immune support. - Order DEXA scan - Order blood work including vitamin D , cholesterol, B12, and blood count - Administer flu shot - Schedule mammogram for May/June - Discuss pneumonia vaccine for next visit  Follow-up - Schedule follow-up visit in six months.       Return in about 6 months (around 08/20/2023), or if symptoms worsen or fail to improve, for chronic follow up .     Rosaline Bruns, FNP

## 2023-02-20 NOTE — Telephone Encounter (Signed)
Informed pt to take a multivitamin with vitamin c in it per [place]. Pt verbalized understanding

## 2023-02-21 ENCOUNTER — Encounter: Payer: Self-pay | Admitting: Family Medicine

## 2023-02-21 LAB — CMP14+EGFR
ALT: 12 IU/L (ref 0–32)
AST: 19 IU/L (ref 0–40)
Albumin: 4.1 g/dL (ref 3.8–4.8)
Alkaline Phosphatase: 55 IU/L (ref 44–121)
BUN/Creatinine Ratio: 11 — ABNORMAL LOW (ref 12–28)
BUN: 9 mg/dL (ref 8–27)
Bilirubin Total: 0.4 mg/dL (ref 0.0–1.2)
CO2: 24 mmol/L (ref 20–29)
Calcium: 9.2 mg/dL (ref 8.7–10.3)
Chloride: 104 mmol/L (ref 96–106)
Creatinine, Ser: 0.79 mg/dL (ref 0.57–1.00)
Globulin, Total: 2.8 g/dL (ref 1.5–4.5)
Glucose: 81 mg/dL (ref 70–99)
Potassium: 4.3 mmol/L (ref 3.5–5.2)
Sodium: 140 mmol/L (ref 134–144)
Total Protein: 6.9 g/dL (ref 6.0–8.5)
eGFR: 80 mL/min/{1.73_m2} (ref >59)

## 2023-02-21 LAB — ANEMIA PROFILE B
Basophils Absolute: 0 10*3/uL (ref 0.0–0.2)
Basos: 0 %
EOS (ABSOLUTE): 0.1 10*3/uL (ref 0.0–0.4)
Eos: 3 %
Ferritin: 44 ng/mL (ref 15–150)
Hematocrit: 39.9 % (ref 34.0–46.6)
Hemoglobin: 13.3 g/dL (ref 11.1–15.9)
Immature Grans (Abs): 0 10*3/uL (ref 0.0–0.1)
Immature Granulocytes: 0 %
Iron Saturation: 28 % (ref 15–55)
Iron: 87 ug/dL (ref 27–139)
Lymphocytes Absolute: 1.2 10*3/uL (ref 0.7–3.1)
Lymphs: 35 %
MCH: 31.4 pg (ref 26.6–33.0)
MCHC: 33.3 g/dL (ref 31.5–35.7)
MCV: 94 fL (ref 79–97)
Monocytes Absolute: 0.3 10*3/uL (ref 0.1–0.9)
Monocytes: 8 %
Neutrophils Absolute: 1.8 10*3/uL (ref 1.4–7.0)
Neutrophils: 54 %
Platelets: 215 10*3/uL (ref 150–450)
RBC: 4.23 x10E6/uL (ref 3.77–5.28)
RDW: 12.6 % (ref 11.7–15.4)
Retic Ct Pct: 1.2 % (ref 0.6–2.6)
Total Iron Binding Capacity: 308 ug/dL (ref 250–450)
UIBC: 221 ug/dL (ref 118–369)
Vitamin B-12: 638 pg/mL (ref 232–1245)
WBC: 3.3 10*3/uL — ABNORMAL LOW (ref 3.4–10.8)

## 2023-02-21 LAB — LIPID PANEL
Cholesterol, Total: 186 mg/dL (ref 100–199)
HDL: 68 mg/dL (ref >39)
LDL CALC COMMENT:: 2.7 ratio (ref 0.0–4.4)
LDL Chol Calc (NIH): 102 mg/dL — ABNORMAL HIGH (ref 0–99)
Triglycerides: 87 mg/dL (ref 0–149)
VLDL Cholesterol Cal: 16 mg/dL (ref 5–40)

## 2023-02-21 LAB — THYROID PANEL WITH TSH
Free Thyroxine Index: 2.4 (ref 1.2–4.9)
T3 Uptake Ratio: 27 % (ref 24–39)
T4, Total: 8.8 ug/dL (ref 4.5–12.0)
TSH: 1.36 u[IU]/mL (ref 0.450–4.500)

## 2023-02-21 LAB — VITAMIN D 25 HYDROXY (VIT D DEFICIENCY, FRACTURES): Vit D, 25-Hydroxy: 88.1 ng/mL (ref 30.0–100.0)

## 2023-02-22 DIAGNOSIS — Z780 Asymptomatic menopausal state: Secondary | ICD-10-CM | POA: Diagnosis not present

## 2023-02-23 ENCOUNTER — Encounter: Payer: Self-pay | Admitting: Family Medicine

## 2023-03-01 ENCOUNTER — Other Ambulatory Visit: Payer: Medicare Other

## 2023-03-01 DIAGNOSIS — I10 Essential (primary) hypertension: Secondary | ICD-10-CM | POA: Diagnosis not present

## 2023-03-11 ENCOUNTER — Other Ambulatory Visit: Payer: Self-pay | Admitting: Family Medicine

## 2023-03-11 DIAGNOSIS — E559 Vitamin D deficiency, unspecified: Secondary | ICD-10-CM

## 2023-03-23 DIAGNOSIS — E782 Mixed hyperlipidemia: Secondary | ICD-10-CM | POA: Diagnosis not present

## 2023-03-23 DIAGNOSIS — I493 Ventricular premature depolarization: Secondary | ICD-10-CM | POA: Diagnosis not present

## 2023-03-23 DIAGNOSIS — I10 Essential (primary) hypertension: Secondary | ICD-10-CM | POA: Diagnosis not present

## 2023-03-26 DIAGNOSIS — I10 Essential (primary) hypertension: Secondary | ICD-10-CM | POA: Diagnosis not present

## 2023-03-26 DIAGNOSIS — I493 Ventricular premature depolarization: Secondary | ICD-10-CM | POA: Diagnosis not present

## 2023-03-26 DIAGNOSIS — R0600 Dyspnea, unspecified: Secondary | ICD-10-CM | POA: Diagnosis not present

## 2023-04-05 ENCOUNTER — Ambulatory Visit: Payer: Medicare Other

## 2023-04-05 VITALS — BP 128/76 | HR 63 | Ht 59.0 in | Wt 145.0 lb

## 2023-04-05 DIAGNOSIS — I493 Ventricular premature depolarization: Secondary | ICD-10-CM | POA: Diagnosis not present

## 2023-04-05 DIAGNOSIS — I10 Essential (primary) hypertension: Secondary | ICD-10-CM | POA: Diagnosis not present

## 2023-04-05 DIAGNOSIS — Z0000 Encounter for general adult medical examination without abnormal findings: Secondary | ICD-10-CM | POA: Diagnosis not present

## 2023-04-05 DIAGNOSIS — I3489 Other nonrheumatic mitral valve disorders: Secondary | ICD-10-CM | POA: Diagnosis not present

## 2023-04-05 DIAGNOSIS — I3481 Nonrheumatic mitral (valve) annulus calcification: Secondary | ICD-10-CM | POA: Diagnosis not present

## 2023-04-05 NOTE — Progress Notes (Signed)
 Subjective:   Nicole Aguirre is a 73 y.o. who presents for a Medicare Wellness preventive visit.  Visit Complete: Virtual I connected with  [place] on 04/05/23 by a audio enabled telemedicine application and verified that I am speaking with the correct person using two identifiers.  Patient Location: Home  Provider Location: Home Office  I discussed the limitations of evaluation and management by telemedicine. The patient expressed understanding and agreed to proceed.  Vital Signs: Because this visit was a virtual/telehealth visit, some criteria may be missing or patient reported. Any vitals not documented were not able to be obtained and vitals that have been documented are patient reported.  VideoDeclined- This patient declined Librarian, academic. Therefore the visit was completed with audio only.  Persons Participating in Visit: Patient.  AWV Questionnaire: No: Patient Medicare AWV questionnaire was not completed prior to this visit.  Cardiac Risk Factors include: advanced age (>32men, >57 women);sedentary lifestyle     Objective:    Today's Vitals   04/05/23 1349  BP: 128/76  Pulse: 63  Weight: 145 lb (65.8 kg)  Height: 4\' 11"  (1.499 m)   Body mass index is 29.29 kg/m.     04/05/2023    2:20 PM 03/30/2022    8:55 AM 03/14/2021    9:58 AM 03/11/2020    9:48 AM 07/26/2018    3:12 PM  Advanced Directives  Does Patient Have a Medical Advance Directive? Yes No No No No  Would patient like information on creating a medical advance directive?  No - Patient declined No - Patient declined No - Patient declined No - Patient declined    Current Medications (verified) Outpatient Encounter Medications as of 04/05/2023  Medication Sig   albuterol (VENTOLIN HFA) 108 (90 Base) MCG/ACT inhaler Inhale 2 puffs into the lungs every 6 (six) hours as needed.   atorvastatin (LIPITOR) 40 MG tablet TAKE 1 TABLET BY MOUTH AT  BEDTIME    diclofenac Sodium (VOLTAREN) 1 % GEL APPLY 2 GRAMS TO AFFECTED AREA 4 TIMES A DAY   fexofenadine (ALLEGRA) 180 MG tablet TAKE 1 TABLET BY MOUTH EVERY DAY   fluticasone (FLONASE) 50 MCG/ACT nasal spray USE 2 SPRAYS IN BOTH NOSTRILS  DAILY   gabapentin (NEURONTIN) 300 MG capsule TAKE 1 CAPSULE BY MOUTH 3 TIMES  DAILY   hyoscyamine (LEVSIN) 0.125 MG tablet Take 1 tablet (0.125 mg total) by mouth every 4 (four) hours as needed for cramping.   loperamide (IMODIUM) 2 MG capsule TAKE 1 CAPSULE BY MOUTH 4 TIMES  DAILY AS NEEDED FOR DIARRHEA OR  LOOSE STOOLS   losartan (COZAAR) 50 MG tablet Take 50 mg by mouth daily. Dr. Lennart Pall w/[facility] Once daily   Magnesium 200 MG TABS Take 1 tablet (200 mg total) by mouth daily.   metoprolol succinate (TOPROL-XL) 25 MG 24 hr tablet TAKE 1/2 TABLET BY MOUTH DAILY   montelukast (SINGULAIR) 10 MG tablet TAKE 1 TABLET BY MOUTH DAILY   ondansetron (ZOFRAN) 4 MG tablet TAKE 1 TABLET BY MOUTH EVERY 8  HOURS AS NEEDED FOR NAUSEA AND  VOMITING   pantoprazole (PROTONIX) 40 MG tablet Take 1 tablet (40 mg total) by mouth daily.   Vitamin D, Ergocalciferol, (DRISDOL) 1.25 MG (50000 UNIT) CAPS capsule TAKE 1 CAPSULE BY MOUTH EVERY 7  DAYS   lisinopril (ZESTRIL) 20 MG tablet Take 1 tablet (20 mg total) by mouth daily. (Patient not taking: Reported on 04/05/2023)   No facility-administered encounter medications on file as of  04/05/2023.    Allergies (verified) Hydrochlorothiazide   History: Past Medical History:  Diagnosis Date   Allergy    Fibromyalgia    GERD (gastroesophageal reflux disease)    Hyperlipidemia    Hypertension    PVC (premature ventricular contraction)    Past Surgical History:  Procedure Laterality Date   ABDOMINAL HYSTERECTOMY     APPENDECTOMY  1985   CATARACT EXTRACTION, BILATERAL     CHOLECYSTECTOMY  2010   Family History  Problem Relation Age of Onset   Lupus Mother    Rheum arthritis Mother    Lung cancer Mother    Emphysema Father     Pancreatic cancer Father    Lupus Sister    Lupus Sister    Lung disease Sister    Heart disease Brother    Early death Brother        Died from falling from a tree   Heart disease Son    Mental illness Son    Breast cancer Neg Hx    Social History   Socioeconomic History   Marital status: Married    Spouse name: Loraine Leriche   Number of children: 2   Years of education: 10th   Highest education level: GED or equivalent  Occupational History   Occupation: Dietary    Comment: Chief Executive Officer   Occupation: retired  Tobacco Use   Smoking status: Former    Current packs/day: 0.00    Average packs/day: 1 pack/day for 4.0 years (4.0 ttl pk-yrs)    Types: Cigarettes    Start date: 07/25/1972    Quit date: 07/25/1976    Years since quitting: 46.7   Smokeless tobacco: Never  Vaping Use   Vaping status: Never Used  Substance and Sexual Activity   Alcohol use: Never   Drug use: Never   Sexual activity: Yes    Birth control/protection: Surgical  Other Topics Concern   Not on file  Social History Narrative   2 sons   5 grandchildren.   Social Drivers of Corporate investment banker Strain: Low Risk  (04/05/2023)   Overall Financial Resource Strain (CARDIA)    Difficulty of Paying Living Expenses: Not hard at all  Food Insecurity: No Food Insecurity (04/05/2023)   Hunger Vital Sign    Worried About Running Out of Food in the Last Year: Never true    Ran Out of Food in the Last Year: Never true  Transportation Needs: No Transportation Needs (04/05/2023)   PRAPARE - Administrator, Civil Service (Medical): No    Lack of Transportation (Non-Medical): No  Physical Activity: Sufficiently Active (04/05/2023)   Exercise Vital Sign    Days of Exercise per Week: 7 days    Minutes of Exercise per Session: 30 min  Stress: No Stress Concern Present (04/05/2023)   [employer] of Occupational Health - Occupational Stress Questionnaire    Feeling of Stress : Not at all  Social  Connections: Moderately Isolated (04/05/2023)   Social Connection and Isolation Panel [NHANES]    Frequency of Communication with Friends and Family: Once a week    Frequency of Social Gatherings with Friends and Family: Once a week    Attends Religious Services: 1 to 4 times per year    Active Member of [employer] or Organizations: Yes    Attends Banker Meetings: Never    Marital Status: Widowed    Tobacco Counseling Counseling given: Yes    Clinical Intake:  Pre-visit preparation  completed: Yes  Pain : No/denies pain     BMI - recorded: 29.29 Nutritional Status: BMI 25 -29 Overweight Nutritional Risks: None Diabetes: No  No results found for: "HGBA1C"   How often do you need to have someone help you when you read instructions, pamphlets, or other written materials from your doctor or pharmacy?: 1 - Never  Interpreter Needed?: No  Information entered by :: Smith Mince   Activities of Daily Living     04/05/2023    2:17 PM  In your present state of health, do you have any difficulty performing the following activities:  Hearing? 1  Vision? 0  Difficulty concentrating or making decisions? 1  Walking or climbing stairs? 1  Dressing or bathing? 1  Doing errands, shopping? 1  Preparing Food and eating ? Y  Using the Toilet? Y  In the past six months, have you accidently leaked urine? Y  Do you have problems with loss of bowel control? Y  Managing your Medications? Y  Managing your Finances? Y  Housekeeping or managing your Housekeeping? Y    Patient Care Team: Sonny Masters, FNP as PCP - General (Family Medicine)  Indicate any recent Medical Services you may have received from other than [facility] providers in the past year (date may be approximate).     Assessment:   This is a routine wellness examination for [place].  Hearing/Vision screen Hearing Screening - Comments:: Pt needs air aids, cost too much to get them Vision Screening - Comments::  Pt denies vision def even w/glasses on  Pt see [address] [facility] [place] in [place]    Goals Addressed             This Visit's Progress    COMPLETED: Patient Stated       04/05/23 Try to stay active by keep up house work, to keep moving       Depression Screen     04/05/2023    2:15 PM 04/05/2023    1:53 PM 12/07/2022   11:58 AM 11/15/2022    9:50 AM 03/30/2022    8:54 AM 02/22/2022   11:42 AM 01/31/2022    9:11 AM  PHQ 2/9 Scores  PHQ - 2 Score 0 0 0 0 0 0 0  PHQ- 9 Score 0 0 0 0       Fall Risk     04/05/2023    1:53 PM 11/15/2022    9:50 AM 03/30/2022    8:52 AM 02/22/2022   11:42 AM 01/31/2022    9:11 AM  Fall Risk   Falls in the past year? 0 0 0 0 0  Number falls in past yr: 0 0 0    Injury with Fall? 0 0 0    Risk for fall due to : No Fall Risks No Fall Risks No Fall Risks    Follow up Falls prevention discussed;Falls evaluation completed Falls evaluation completed Falls prevention discussed      MEDICARE RISK AT HOME:  Medicare Risk at Home Any stairs in or around the home?: No If so, are there any without handrails?: No Home free of loose throw rugs in walkways, pet beds, electrical cords, etc?: Yes Adequate lighting in your home to reduce risk of falls?: Yes Life alert?: No Use of a cane, walker or w/c?: No Grab bars in the bathroom?: No Shower chair or bench in shower?: No Elevated toilet seat or a handicapped toilet?: No  TIMED UP AND GO:  Was the test performed?  No  Cognitive Function: 6CIT completed        04/05/2023    4:45 PM 03/30/2022    8:55 AM 03/11/2020    9:51 AM 07/26/2018    3:16 PM  6CIT Screen  What Year? 0 points 0 points 0 points 0 points  What month? 0 points 0 points 0 points 0 points  What time? 0 points 0 points 0 points 0 points  Count back from 20 0 points 0 points 0 points 0 points  Months in reverse 0 points 0 points 0 points 2 points  Repeat phrase 0 points 0 points 0 points 0 points  Total Score 0 points 0 points 0  points 2 points    Immunizations Immunization History  Administered Date(s) Administered   Fluad Quad(high Dose 65+) 10/24/2019, 11/08/2020   Fluad Trivalent(High Dose 65+) 02/20/2023   Influenza, High Dose Seasonal PF 10/09/2017   Influenza,inj,Quad PF,6+ Mos 11/15/2016   Moderna Covid-19 Vaccine Bivalent Booster 55yrs & up 02/24/2021   Moderna Sars-Covid-2 Vaccination 01/21/2019, 02/18/2019, 11/20/2019, 08/05/2020   Pneumococcal Conjugate-13 02/13/2017, 02/17/2020   Tdap 11/22/2010   Unspecified SARS-COV-2 Vaccination 03/07/2023    Screening Tests Health Maintenance  Topic Date Due   Pneumonia Vaccine 64+ Years old (2 of 2 - PPSV23 or PCV20) 02/16/2021   Zoster Vaccines- Shingrix (1 of 2) 05/20/2023 (Originally 11/17/1969)   COVID-19 Vaccine (7 - Moderna risk 2024-25 season) 04/20/2024 (Originally 09/04/2023)   MAMMOGRAM  06/14/2023   Medicare Annual Wellness (AWV)  04/04/2024   DEXA SCAN  02/21/2025   Colonoscopy  04/29/2025   INFLUENZA VACCINE  Completed   Hepatitis C Screening  Completed   HPV VACCINES  Aged Out   DTaP/Tdap/Td  Discontinued    Health Maintenance  Health Maintenance Due  Topic Date Due   Pneumonia Vaccine 78+ Years old (2 of 2 - PPSV23 or PCV20) 02/16/2021   Health Maintenance Items Addressed: See Nurse Notes  Additional Screening:  Vision Screening: Recommended annual ophthalmology exams for early detection of glaucoma and other disorders of the eye.  Dental Screening: Recommended annual dental exams for proper oral hygiene  Community Resource Referral / Chronic Care Management: CRR required this visit?  No   CCM required this visit?  No     Plan:     I have personally reviewed and noted the following in the patient's chart:   Medical and social history Use of alcohol, tobacco or illicit drugs  Current medications and supplements including opioid prescriptions. Patient is not currently taking opioid prescriptions. Functional ability  and status Nutritional status Physical activity Advanced directives List of other physicians Hospitalizations, surgeries, and ER visits in previous 12 months Vitals Screenings to include cognitive, depression, and falls Referrals and appointments  In addition, I have reviewed and discussed with patient certain preventive protocols, quality metrics, and best practice recommendations. A written personalized care plan for preventive services as well as general preventive health recommendations were provided to patient.     Arta Silence, CMA   04/05/2023   After Visit Summary: (MyChart) Due to this being a telephonic visit, the after visit summary with patients personalized plan was offered to patient via MyChart   Notes:  Encouraged pt to make appt for her vaccines to done pt is aware: Tetanus, Pneumona, and Shingles.

## 2023-04-05 NOTE — Patient Instructions (Signed)
 Nicole Aguirre , Thank you for taking time to come for your Medicare Wellness Visit. I appreciate your ongoing commitment to your health goals. Please review the following plan we discussed and let me know if I can assist you in the future.   Referrals/Orders/Follow-Ups/Clinician Recommendations: As a reminder don't forget to call and make your appointment with nurse to get your vaccines done: Tetanus, Pneumonia, and Shingles.   This is a list of the screening recommended for you and due dates:  Health Maintenance  Topic Date Due   Pneumonia Vaccine (2 of 2 - PPSV23 or PCV20) 02/16/2021   Zoster (Shingles) Vaccine (1 of 2) 05/20/2023*   COVID-19 Vaccine (7 - Moderna risk 2024-25 season) 04/20/2024*   Mammogram  06/14/2023   Medicare Annual Wellness Visit  04/04/2024   DEXA scan (bone density measurement)  02/21/2025   Colon Cancer Screening  04/29/2025   Flu Shot  Completed   Hepatitis C Screening  Completed   HPV Vaccine  Aged Out   DTaP/Tdap/Td vaccine  Discontinued  *Topic was postponed. The date shown is not the original due date.    Advanced directives: (Declined) Advance directive discussed with you today. Even though you declined this today, please call our office should you change your mind, and we can give you the proper paperwork for you to fill out.  Next Medicare Annual Wellness Visit scheduled for next year: Yes

## 2023-04-18 ENCOUNTER — Other Ambulatory Visit: Payer: Self-pay | Admitting: Family Medicine

## 2023-04-18 DIAGNOSIS — I10 Essential (primary) hypertension: Secondary | ICD-10-CM

## 2023-04-23 ENCOUNTER — Telehealth: Payer: Self-pay

## 2023-04-23 NOTE — Telephone Encounter (Signed)
 Pt is taking Protonix has been for two months does not seem to be helping much at all.Pt is wondering can it be changed to something else.Would nexium or prevacid be any good? Pt has taken omeprazole in the past for years and it got to the point it was not any good.

## 2023-04-23 NOTE — Telephone Encounter (Signed)
 Patient called, left VM to return the call to the office to speak to a nurse.    Copied from CRM [id number]. Topic: Clinical - Medication Question >> Apr 23, 2023  9:01 AM Franchot Heidelberg wrote: Reason for CRM: Pt has medication questions regarding nexium and protonix  Best contact: [phone]

## 2023-04-24 ENCOUNTER — Telehealth: Payer: Self-pay

## 2023-04-24 ENCOUNTER — Other Ambulatory Visit: Payer: Self-pay | Admitting: Family Medicine

## 2023-04-24 DIAGNOSIS — J309 Allergic rhinitis, unspecified: Secondary | ICD-10-CM

## 2023-04-24 DIAGNOSIS — K219 Gastro-esophageal reflux disease without esophagitis: Secondary | ICD-10-CM

## 2023-04-24 MED ORDER — ESOMEPRAZOLE MAGNESIUM 40 MG PO CPDR: 40.0000 mg | DELAYED_RELEASE_CAPSULE | Freq: Every day | ORAL | 1 refills | Status: DC

## 2023-04-24 NOTE — Telephone Encounter (Signed)
 Refer to other phone call

## 2023-04-24 NOTE — Telephone Encounter (Signed)
 Copied from CRM [id number]. Topic: Clinical - Prescription Issue >> Apr 24, 2023  3:02 PM Pierre Bali B wrote: Reason for CRM: patient stated that she is not symptomatic and does not want a GI . She just wanted to know if the medicine  pantoprazole (PROTONIX) 40 MG tablet can be changed in anyway if possible .

## 2023-04-24 NOTE — Telephone Encounter (Signed)
 [email or web address]

## 2023-04-24 NOTE — Telephone Encounter (Signed)
 Patient aware and verbalizes understanding.

## 2023-04-29 ENCOUNTER — Other Ambulatory Visit: Payer: Self-pay | Admitting: Family Medicine

## 2023-04-29 DIAGNOSIS — E782 Mixed hyperlipidemia: Secondary | ICD-10-CM

## 2023-04-29 DIAGNOSIS — K219 Gastro-esophageal reflux disease without esophagitis: Secondary | ICD-10-CM

## 2023-04-30 ENCOUNTER — Telehealth: Payer: Self-pay

## 2023-04-30 DIAGNOSIS — I493 Ventricular premature depolarization: Secondary | ICD-10-CM | POA: Diagnosis not present

## 2023-04-30 DIAGNOSIS — I10 Essential (primary) hypertension: Secondary | ICD-10-CM | POA: Diagnosis not present

## 2023-04-30 NOTE — Telephone Encounter (Signed)
 Copied from CRM [id number]. Topic: Clinical - Prescription Issue >> Apr 30, 2023 11:07 AM Elle L wrote: Reason for CRM: The patient cannot afford the esomeprazole (NEXIUM) 40 MG capsule prescription and is requesting if a replacement medication can be called in for her to the [facility] [place]. Her call back number is [phone] if needed.

## 2023-05-01 MED ORDER — OMEPRAZOLE 40 MG PO CPDR: 40.0000 mg | DELAYED_RELEASE_CAPSULE | Freq: Every day | ORAL | 3 refills | Status: DC

## 2023-05-01 NOTE — Addendum Note (Signed)
 Addended by: Galvin Jules on: 05/01/2023 07:56 AM   Modules accepted: Orders

## 2023-05-01 NOTE — Telephone Encounter (Signed)
 Patient notified that new rx sent to pharmacy. Patient verbalized understanding

## 2023-05-02 ENCOUNTER — Ambulatory Visit: Payer: Self-pay

## 2023-05-02 NOTE — Telephone Encounter (Signed)
 Copied from CRM [id number]. Topic: Clinical - Prescription Issue >> May 02, 2023 10:01 AM Rennis Case wrote: Reason for CRM: Pt inquiring if omeprazole is okay to take. Patient states she had been on it before and at some point it didn't help any longer.   Pt inquiring if okay to take instead of the Protonix.   Best call back number: [phone]   Patient called to report that she was recently prescribed Omeprazole for her GERD after her Protonix was unable to control her symptoms. The patient reports that she has taken Omeprazole in the past and that it stopped being effective which is why she was changed to Protonix. Patient would like clarification on why she was placed back on Omeprazole and not a different medication, due to concerns that the Omeprazole may not be effective still. Patient would like someone from the office to contact her for clarification.    Reason for Disposition  [1] Caller has NON-URGENT medicine question about med that PCP prescribed AND [2] triager unable to answer question  Answer Assessment - Initial Assessment Questions 1. NAME of MEDICINE: "What medicine(s) are you calling about?"     Omeprazole  2. QUESTION: "What is your question?" (e.g., double dose of medicine, side effect)     Has taken in the past and was not effective  3. PRESCRIBER: "Who prescribed the medicine?" Reason: if prescribed by specialist, call should be referred to that group.     Evalyn Hillier  Protocols used: Medication Question Call-A-AH

## 2023-05-02 NOTE — Telephone Encounter (Signed)
 Pt informed    LS

## 2023-05-02 NOTE — Telephone Encounter (Signed)
 Please review and advise.

## 2023-05-02 NOTE — Telephone Encounter (Signed)
 Duplicate phone message

## 2023-05-02 NOTE — Telephone Encounter (Signed)
 RN called patient for more information. Patient did not answer, RN LVM with a callback number.   Copied from CRM [id number]. Topic: Clinical - Prescription Issue >> May 02, 2023 10:01 AM Rennis Case wrote: Reason for CRM: Pt inquiring if omeprazole is okay to take. Patient states she had been on it before and at some point it didn't help any longer.   Pt inquiring if okay to take instead of the protonix.   Best call back number: [phone]

## 2023-05-14 ENCOUNTER — Other Ambulatory Visit: Payer: Self-pay | Admitting: Family Medicine

## 2023-05-14 DIAGNOSIS — K219 Gastro-esophageal reflux disease without esophagitis: Secondary | ICD-10-CM

## 2023-05-25 ENCOUNTER — Other Ambulatory Visit: Payer: Self-pay | Admitting: Family Medicine

## 2023-05-25 DIAGNOSIS — K582 Mixed irritable bowel syndrome: Secondary | ICD-10-CM

## 2023-05-25 NOTE — Telephone Encounter (Signed)
 Copied from CRM [id number]. Topic: Clinical - Medication Refill >> May 25, 2023 10:06 AM Brittney F wrote:    Medication: loperamide  (IMODIUM ) 2 MG capsule  Has the patient contacted their pharmacy? Yes   This is the patient's preferred pharmacy:    [facility] - [place], [place] - [address] [facility] [address] [place] [address] [place] [address] Phone: [phone] Fax: [phone]   Is this the correct pharmacy for this prescription? Yes   Has the prescription been filled recently? No  Is the patient out of the medication? No  Has the patient been seen for an appointment in the last year OR does the patient have an upcoming appointment? Yes  Can we respond through MyChart? Yes  Agent: Please be advised that Rx refills may take up to 3 business days. We ask that you follow-up with your pharmacy.

## 2023-05-28 ENCOUNTER — Encounter

## 2023-06-25 ENCOUNTER — Ambulatory Visit: Payer: Self-pay

## 2023-06-25 ENCOUNTER — Ambulatory Visit
Admission: RE | Admit: 2023-06-25 | Discharge: 2023-06-25 | Disposition: A | Discharge status: Home / Self Care | Source: Ambulatory Visit | Attending: Family Medicine

## 2023-06-25 DIAGNOSIS — Z1231 Encounter for screening mammogram for malignant neoplasm of breast: Secondary | ICD-10-CM | POA: Diagnosis not present

## 2023-06-25 NOTE — Telephone Encounter (Signed)
 FYI Only or Action Required?: Action required by provider  Patient was last seen in primary care on 02/20/2023 by Nicole Jules, Nicole Aguirre. Called Nurse Triage reporting Dysuria. Symptoms began several days ago. Interventions attempted: Nothing. Symptoms are: unchanged.  Triage Disposition: See PCP Within 2 Weeks  Patient/caregiver understands and will follow disposition?: YesCopied from CRM [id number]. Topic: Clinical - Red Word Triage >> Jun 25, 2023  9:54 AM Nicole Aguirre wrote: [facility] that prompted transfer to Nurse Triage: burning when she pees, she thinks she puss.. possible kidney infection Reason for Disposition  All other urine symptoms  Answer Assessment - Initial Assessment Questions 1. SYMPTOM: "What's the main symptom you're concerned about?" (e.Aguirre., frequency, incontinence)     Burning, frequency, urgency  2. ONSET: "When did the    start?"     Friday 3. PAIN: "Is there any pain?" If Yes, ask: "How bad is it?" (Scale: 1-10; mild, moderate, severe)     Pain while urinating  4. CAUSE: "What do you think is causing the symptoms?"     Not sure  5. OTHER SYMPTOMS: "Do you have any other symptoms?" (e.Aguirre., blood in urine, fever, flank pain, pain with urination)     "Puss in urine", smell is "fruity"     Pt requested Thursday due to not being in town until then.  Protocols used: Urinary Symptoms-A-AH

## 2023-06-25 NOTE — Telephone Encounter (Signed)
 Pt has appt for 06/28/2023, offered video visit and she is not able to do one. I also offered appt for tomorrow 06/26/2023 and she will be out of town and offered appt for 06/27/2023 as well but pt declined and said she would keep appt for 06/28/23

## 2023-06-28 ENCOUNTER — Ambulatory Visit: Admitting: Family Medicine

## 2023-06-28 ENCOUNTER — Ambulatory Visit: Payer: Self-pay | Admitting: Family Medicine

## 2023-06-28 ENCOUNTER — Encounter: Payer: Self-pay | Admitting: Family Medicine

## 2023-06-28 VITALS — BP 126/78 | HR 64 | Temp 98.0°F | Ht 59.0 in | Wt 146.0 lb

## 2023-06-28 DIAGNOSIS — N898 Other specified noninflammatory disorders of vagina: Secondary | ICD-10-CM | POA: Diagnosis not present

## 2023-06-28 DIAGNOSIS — R300 Dysuria: Secondary | ICD-10-CM

## 2023-06-28 LAB — URINALYSIS, ROUTINE W REFLEX MICROSCOPIC
Bilirubin, UA: NEGATIVE
Glucose, UA: NEGATIVE
Ketones, UA: NEGATIVE
Leukocytes,UA: NEGATIVE
Nitrite, UA: NEGATIVE
Protein,UA: NEGATIVE
RBC, UA: NEGATIVE
Specific Gravity, UA: 1.01 (ref 1.005–1.030)
Urobilinogen, Ur: 0.2 mg/dL (ref 0.2–1.0)
pH, UA: 6.5 (ref 5.0–7.5)

## 2023-06-28 LAB — WET PREP FOR TRICH, YEAST, CLUE
Trichomonas Exam: NEGATIVE
Yeast Exam: NEGATIVE

## 2023-06-28 NOTE — Progress Notes (Signed)
 Subjective:  Patient ID: Nicole Aguirre, female    DOB: 12-09-1950, 73 y.o.   MRN: [id number]  Patient Care Team: Galvin Jules, FNP as PCP - General (Family Medicine)   Chief Complaint:  Urinary Tract Infection   HPI: Nicole Aguirre is a 73 y.o. female presenting on 06/28/2023 for Urinary Tract Infection   Dysuria  This is a new problem. The current episode started in the past 7 days. The problem occurs every urination. The problem has been gradually worsening. The quality of the pain is described as burning and aching. The pain is at a severity of 7/10. There has been no fever. She is Not sexually active. There is A history of pyelonephritis. Associated symptoms include chills, a discharge, flank pain, frequency, hesitancy and urgency. Pertinent negatives include no hematuria, nausea, possible pregnancy, sweats or vomiting. She has tried increased fluids (cranberry juice) for the symptoms. The treatment provided mild relief. Her past medical history is significant for recurrent UTIs.  States that she has history to yeast and BV infections. States that she is having itching, discharge, and a fruity odor.   Relevant past medical, surgical, family, and social history reviewed and updated as indicated.  Allergies and medications reviewed and updated. Data reviewed: Chart in Epic.   Past Medical History:  Diagnosis Date   Allergy    Fibromyalgia    GERD (gastroesophageal reflux disease)    Hyperlipidemia    Hypertension    PVC (premature ventricular contraction)     Past Surgical History:  Procedure Laterality Date   ABDOMINAL HYSTERECTOMY     APPENDECTOMY  1985   CATARACT EXTRACTION, BILATERAL     CHOLECYSTECTOMY  2010    Social History   Socioeconomic History   Marital status: Married    Spouse name: Lavonia Powers   Number of children: 2   Years of education: 10th   Highest education level: GED or equivalent  Occupational History   Occupation: Dietary    Comment:  Chief Executive Officer   Occupation: retired  Tobacco Use   Smoking status: Former    Current packs/day: 0.00    Average packs/day: 1 pack/day for 4.0 years (4.0 ttl pk-yrs)    Types: Cigarettes    Start date: 07/25/1972    Quit date: 07/25/1976    Years since quitting: 46.9   Smokeless tobacco: Never  Vaping Use   Vaping status: Never Used  Substance and Sexual Activity   Alcohol use: Never   Drug use: Never   Sexual activity: Yes    Birth control/protection: Surgical  Other Topics Concern   Not on file  Social History Narrative   2 sons   5 grandchildren.   Social Drivers of Corporate investment banker Strain: Low Risk  (04/05/2023)   Overall Financial Resource Strain (CARDIA)    Difficulty of Paying Living Expenses: Not hard at all  Food Insecurity: No Food Insecurity (04/05/2023)   Hunger Vital Sign    Worried About Running Out of Food in the Last Year: Never true    Ran Out of Food in the Last Year: Never true  Transportation Needs: No Transportation Needs (04/05/2023)   PRAPARE - Administrator, Civil Service (Medical): No    Lack of Transportation (Non-Medical): No  Physical Activity: Sufficiently Active (04/05/2023)   Exercise Vital Sign    Days of Exercise per Week: 7 days    Minutes of Exercise per Session: 30 min  Stress: No Stress Concern Present (  04/05/2023)   [employer] [employer] - Occupational Stress Questionnaire    Feeling of Stress : Not at all  Social Connections: Moderately Isolated (04/05/2023)   Social Connection and Isolation Panel    Frequency of Communication with Friends and Family: Once a week    Frequency of Social Gatherings with Friends and Family: Once a week    Attends Religious Services: 1 to 4 times per year    Active Member of [employer] or Organizations: Yes    Attends Banker Meetings: Never    Marital Status: Widowed  Intimate Partner Violence: Not At Risk (04/05/2023)   Humiliation, Afraid, Rape, and Kick  questionnaire    Fear of Current or Ex-Partner: No    Emotionally Abused: No    Physically Abused: No    Sexually Abused: No    Outpatient Encounter Medications as of 06/28/2023  Medication Sig   albuterol  (VENTOLIN  HFA) 108 (90 Base) MCG/ACT inhaler Inhale 2 puffs into the lungs every 6 (six) hours as needed.   atorvastatin  (LIPITOR) 40 MG tablet TAKE 1 TABLET BY MOUTH AT  BEDTIME   diclofenac  Sodium (VOLTAREN ) 1 % GEL APPLY 2 GRAMS TO AFFECTED AREA 4 TIMES A DAY   fexofenadine  (ALLEGRA ) 180 MG tablet TAKE 1 TABLET BY MOUTH EVERY DAY   fluticasone  (FLONASE ) 50 MCG/ACT nasal spray USE 2 SPRAYS IN BOTH NOSTRILS  DAILY   gabapentin  (NEURONTIN ) 300 MG capsule TAKE 1 CAPSULE BY MOUTH 3 TIMES  DAILY   hyoscyamine  (LEVSIN) 0.125 MG tablet Take 1 tablet (0.125 mg total) by mouth every 4 (four) hours as needed for cramping.   loperamide  (IMODIUM ) 2 MG capsule TAKE 1 CAPSULE BY MOUTH 4 TIMES  DAILY AS NEEDED FOR DIARRHEA OR  LOOSE STOOLS   losartan (COZAAR) 50 MG tablet Take 50 mg by mouth daily. Dr. Diedra Fowler w/[facility] Once daily   Magnesium  200 MG TABS Take 1 tablet (200 mg total) by mouth daily.   metoprolol  succinate (TOPROL -XL) 25 MG 24 hr tablet TAKE 1/2 TABLET BY MOUTH DAILY   montelukast  (SINGULAIR ) 10 MG tablet TAKE 1 TABLET BY MOUTH DAILY   omeprazole  (PRILOSEC) 40 MG capsule TAKE 1 CAPSULE BY MOUTH DAILY   ondansetron  (ZOFRAN ) 4 MG tablet TAKE 1 TABLET BY MOUTH EVERY 8  HOURS AS NEEDED FOR NAUSEA AND  VOMITING   Vitamin D , Ergocalciferol , (DRISDOL ) 1.25 MG (50000 UNIT) CAPS capsule TAKE 1 CAPSULE BY MOUTH EVERY 7  DAYS   [DISCONTINUED] lisinopril  (ZESTRIL ) 20 MG tablet Take 1 tablet (20 mg total) by mouth daily. (Patient not taking: Reported on 04/05/2023)   No facility-administered encounter medications on file as of 06/28/2023.    Allergies  Allergen Reactions   Hydrochlorothiazide  Other (See Comments)    cramping  cramping cramping    cramping    Review of Systems   Constitutional:  Positive for chills.  Gastrointestinal:  Negative for nausea and vomiting.  Genitourinary:  Positive for dysuria, flank pain, frequency, hesitancy and urgency. Negative for hematuria.   Objective:  BP 126/78   Pulse 64   Temp 98 F (36.7 C)   Ht 4' 11 (1.499 m)   Wt 146 lb (66.2 kg)   SpO2 95%   BMI 29.49 kg/m    Wt Readings from Last 3 Encounters:  06/28/23 146 lb (66.2 kg)  04/05/23 145 lb (65.8 kg)  02/20/23 145 lb 12.8 oz (66.1 kg)    Physical Exam Constitutional:      General: She is awake.  She is not in acute distress.    Appearance: Normal appearance. She is well-developed and well-groomed. She is not ill-appearing, toxic-appearing or diaphoretic.   Cardiovascular:     Rate and Rhythm: Normal rate and regular rhythm.     Pulses:          Radial pulses are 2+ on the right side.     Heart sounds: Normal heart sounds. No murmur heard.    No gallop.  Pulmonary:     Effort: Pulmonary effort is normal. No respiratory distress.     Breath sounds: Normal breath sounds. No stridor. No wheezing, rhonchi or rales.  Abdominal:     General: Abdomen is flat. Bowel sounds are normal.     Palpations: Abdomen is soft.     Tenderness: There is abdominal tenderness in the suprapubic area. There is no right CVA tenderness or left CVA tenderness.     Hernia: No hernia is present.   Musculoskeletal:     Cervical back: Full passive range of motion without pain and neck supple.   Skin:    General: Skin is warm.     Capillary Refill: Capillary refill takes less than 2 seconds.   Neurological:     General: No focal deficit present.     Mental Status: She is alert, oriented to person, place, and time and easily aroused. Mental status is at baseline.     GCS: GCS eye subscore is 4. GCS verbal subscore is 5. GCS motor subscore is 6.     Motor: No weakness.   Psychiatric:        Attention and Perception: Attention and perception normal.        Mood and Affect:  Mood and affect normal.        Speech: Speech normal.        Behavior: Behavior normal. Behavior is cooperative.        Thought Content: Thought content normal. Thought content does not include homicidal or suicidal ideation. Thought content does not include homicidal or suicidal plan.        Cognition and Memory: Cognition and memory normal.        Judgment: Judgment normal.     Results for orders placed or performed in visit on 02/20/23  Anemia Profile B   Collection Time: 02/20/23 10:57 AM  Result Value Ref Range   Total Iron Binding Capacity 308 250 - 450 ug/dL   UIBC 440 102 - 725 ug/dL   Iron 87 27 - 366 ug/dL   Iron Saturation 28 15 - 55 %   Ferritin 44 15 - 150 ng/mL   Vitamin B-12 638 232 - 1,245 pg/mL   Folate >20.0 >3.0 ng/mL   WBC 3.3 (L) 3.4 - 10.8 x10E3/uL   RBC 4.23 3.77 - 5.28 x10E6/uL   Hemoglobin 13.3 11.1 - 15.9 g/dL   Hematocrit 44.0 34.7 - 46.6 %   MCV 94 79 - 97 fL   MCH 31.4 26.6 - 33.0 pg   MCHC 33.3 31.5 - 35.7 g/dL   RDW 42.5 95.6 - 38.7 %   Platelets 215 150 - 450 x10E3/uL   Neutrophils 54 Not Estab. %   Lymphs 35 Not Estab. %   Monocytes 8 Not Estab. %   Eos 3 Not Estab. %   Basos 0 Not Estab. %   Neutrophils Absolute 1.8 1.4 - 7.0 x10E3/uL   Lymphocytes Absolute 1.2 0.7 - 3.1 x10E3/uL   Monocytes Absolute 0.3 0.1 - 0.9 x10E3/uL  EOS (ABSOLUTE) 0.1 0.0 - 0.4 x10E3/uL   Basophils Absolute 0.0 0.0 - 0.2 x10E3/uL   Immature Granulocytes 0 Not Estab. %   Immature Grans (Abs) 0.0 0.0 - 0.1 x10E3/uL   Retic Ct Pct 1.2 0.6 - 2.6 %  CMP14+EGFR   Collection Time: 02/20/23 10:57 AM  Result Value Ref Range   Glucose 81 70 - 99 mg/dL   BUN 9 8 - 27 mg/dL   Creatinine, Ser 4.78 0.57 - 1.00 mg/dL   eGFR 80 >29 FA/OZH/0.86   BUN/Creatinine Ratio 11 (L) 12 - 28   Sodium 140 134 - 144 mmol/L   Potassium 4.3 3.5 - 5.2 mmol/L   Chloride 104 96 - 106 mmol/L   CO2 24 20 - 29 mmol/L   Calcium  9.2 8.7 - 10.3 mg/dL   Total Protein 6.9 6.0 - 8.5 g/dL    Albumin 4.1 3.8 - 4.8 g/dL   Globulin, Total 2.8 1.5 - 4.5 g/dL   Bilirubin Total 0.4 0.0 - 1.2 mg/dL   Alkaline Phosphatase 55 44 - 121 IU/L   AST 19 0 - 40 IU/L   ALT 12 0 - 32 IU/L  Lipid panel   Collection Time: 02/20/23 10:57 AM  Result Value Ref Range   Cholesterol, Total 186 100 - 199 mg/dL   Triglycerides 87 0 - 149 mg/dL   HDL 68 >57 mg/dL   VLDL Cholesterol Cal 16 5 - 40 mg/dL   LDL Chol Calc (NIH) 846 (H) 0 - 99 mg/dL   Chol/HDL Ratio 2.7 0.0 - 4.4 ratio  Thyroid  Panel With TSH   Collection Time: 02/20/23 10:57 AM  Result Value Ref Range   TSH 1.360 0.450 - 4.500 uIU/mL   T4, Total 8.8 4.5 - 12.0 ug/dL   T3 Uptake Ratio 27 24 - 39 %   Free Thyroxine Index 2.4 1.2 - 4.9  VITAMIN D  25 Hydroxy (Vit-D Deficiency, Fractures)   Collection Time: 02/20/23 10:57 AM  Result Value Ref Range   Vit D, 25-Hydroxy 88.1 30.0 - 100.0 ng/mL       06/28/2023   11:38 AM 04/05/2023    2:15 PM 04/05/2023    1:53 PM 12/07/2022   11:58 AM 11/15/2022    9:50 AM  Depression screen PHQ 2/9  Decreased Interest 0 0 0 0 0  Down, Depressed, Hopeless 0 0 0 0 0  PHQ - 2 Score 0 0 0 0 0  Altered sleeping  0 0 0 0  Tired, decreased energy  0 0 0 0  Change in appetite  0 0 0 0  Feeling bad or failure about yourself   0 0 0 0  Trouble concentrating  0 0 0 0  Moving slowly or fidgety/restless  0 0 0 0  Suicidal thoughts  0 0 0 0  PHQ-9 Score  0 0 0 0  Difficult doing work/chores  Not difficult at all Not difficult at all Not difficult at all Not difficult at all       06/28/2023   11:38 AM 12/07/2022   11:58 AM 11/15/2022    9:50 AM 12/19/2021    1:05 PM  GAD 7 : Generalized Anxiety Score  Nervous, Anxious, on Edge 0 0 0 0  Control/stop worrying 0 0 0 0  Worry too much - different things 0 0 0 0  Trouble relaxing 0 0 0 0  Restless 0 0 0 0  Easily annoyed or irritable 0 0 0 0  Afraid - awful might  happen 0 0 0 0  Total GAD 7 Score 0 0 0 0  Anxiety Difficulty Not difficult at all Not  difficult at all Not difficult at all Not difficult at all      Pertinent labs & imaging results that were available during my care of the patient were reviewed by me and considered in my medical decision making.  Assessment & Plan:  Diagnoses and all orders for this visit:  Vaginal itching Based on labs, will not start medication at this time. Will await results of urine culture. Increase fluids.  -     WET PREP FOR TRICH, YEAST, CLUE  Dysuria Based on labs, will not start medication at this time. Will await results of urine culture. Increase fluids.  -     Urinalysis, Routine w reflex microscopic -     Urine Culture   Continue all other maintenance medications.  Follow up plan: Return if symptoms worsen or fail to improve.   Continue healthy lifestyle choices, including diet (rich in fruits, vegetables, and lean proteins, and low in salt and simple carbohydrates) and exercise (at least 30 minutes of moderate physical activity daily).  Written and verbal instructions provided   The above assessment and management plan was discussed with the patient. The patient verbalized understanding of and has agreed to the management plan. Patient is aware to call the clinic if they develop any new symptoms or if symptoms persist or worsen. Patient is aware when to return to the clinic for a follow-up visit. Patient educated on when it is appropriate to go to the emergency department.   Jacqualyn Mates, DNP-FNP [facility] [address] [place], [place] [address] [phone]

## 2023-06-30 LAB — URINE CULTURE: Organism ID, Bacteria: NO GROWTH

## 2023-07-02 ENCOUNTER — Ambulatory Visit: Payer: Self-pay | Admitting: Family Medicine

## 2023-07-03 ENCOUNTER — Telehealth: Payer: Self-pay

## 2023-07-03 NOTE — Telephone Encounter (Signed)
 Copied from CRM [id number]. Topic: Clinical - Medical Advice >> Jul 03, 2023  2:20 PM Lizabeth Riggs wrote: Reason for CRM:  This message is for FNP Rakes and her nurse: Toryn  thought she had a UTI or yeast infection; both test came back negative. She has vaginal dryness. She wants to know what to do about this and what medication she can take or use in her vaginal.  Please call her or send a message through MyChart on what to do for the dryness.  Thanks

## 2023-07-08 ENCOUNTER — Other Ambulatory Visit: Payer: Self-pay | Admitting: Family Medicine

## 2023-07-08 DIAGNOSIS — M797 Fibromyalgia: Secondary | ICD-10-CM

## 2023-07-19 ENCOUNTER — Other Ambulatory Visit: Payer: Self-pay | Admitting: Family Medicine

## 2023-07-19 DIAGNOSIS — K219 Gastro-esophageal reflux disease without esophagitis: Secondary | ICD-10-CM

## 2023-07-19 DIAGNOSIS — M1611 Unilateral primary osteoarthritis, right hip: Secondary | ICD-10-CM | POA: Diagnosis not present

## 2023-07-19 DIAGNOSIS — M7061 Trochanteric bursitis, right hip: Secondary | ICD-10-CM | POA: Diagnosis not present

## 2023-07-19 DIAGNOSIS — M25551 Pain in right hip: Secondary | ICD-10-CM | POA: Diagnosis not present

## 2023-07-26 DIAGNOSIS — M1611 Unilateral primary osteoarthritis, right hip: Secondary | ICD-10-CM | POA: Diagnosis not present

## 2023-08-08 ENCOUNTER — Other Ambulatory Visit: Payer: Self-pay | Admitting: Family Medicine

## 2023-08-08 DIAGNOSIS — K582 Mixed irritable bowel syndrome: Secondary | ICD-10-CM

## 2023-08-08 DIAGNOSIS — E559 Vitamin D deficiency, unspecified: Secondary | ICD-10-CM

## 2023-08-08 DIAGNOSIS — M797 Fibromyalgia: Secondary | ICD-10-CM

## 2023-08-08 DIAGNOSIS — T7840XS Allergy, unspecified, sequela: Secondary | ICD-10-CM

## 2023-08-08 DIAGNOSIS — E782 Mixed hyperlipidemia: Secondary | ICD-10-CM

## 2023-08-15 DIAGNOSIS — M1611 Unilateral primary osteoarthritis, right hip: Secondary | ICD-10-CM | POA: Diagnosis not present

## 2023-08-21 ENCOUNTER — Ambulatory Visit: Payer: Medicare Other | Admitting: Family Medicine

## 2023-08-21 ENCOUNTER — Encounter: Payer: Self-pay | Admitting: Family Medicine

## 2023-08-21 ENCOUNTER — Other Ambulatory Visit (HOSPITAL_COMMUNITY)
Admission: RE | Admit: 2023-08-21 | Discharge: 2023-08-21 | Disposition: A | Discharge status: Home / Self Care | Source: Ambulatory Visit | Attending: Family Medicine | Admitting: Family Medicine

## 2023-08-21 VITALS — BP 175/84 | HR 56 | Temp 97.3°F | Ht 59.0 in | Wt 145.0 lb

## 2023-08-21 DIAGNOSIS — D485 Neoplasm of uncertain behavior of skin: Secondary | ICD-10-CM

## 2023-08-21 DIAGNOSIS — I493 Ventricular premature depolarization: Secondary | ICD-10-CM | POA: Diagnosis not present

## 2023-08-21 DIAGNOSIS — E559 Vitamin D deficiency, unspecified: Secondary | ICD-10-CM

## 2023-08-21 DIAGNOSIS — E782 Mixed hyperlipidemia: Secondary | ICD-10-CM | POA: Diagnosis not present

## 2023-08-21 DIAGNOSIS — L989 Disorder of the skin and subcutaneous tissue, unspecified: Secondary | ICD-10-CM | POA: Diagnosis not present

## 2023-08-21 DIAGNOSIS — L821 Other seborrheic keratosis: Secondary | ICD-10-CM | POA: Diagnosis not present

## 2023-08-21 DIAGNOSIS — J069 Acute upper respiratory infection, unspecified: Secondary | ICD-10-CM

## 2023-08-21 DIAGNOSIS — Z23 Encounter for immunization: Secondary | ICD-10-CM | POA: Diagnosis not present

## 2023-08-21 DIAGNOSIS — I10 Essential (primary) hypertension: Secondary | ICD-10-CM | POA: Diagnosis not present

## 2023-08-21 NOTE — Progress Notes (Signed)
 Subjective:  Patient ID: Nicole  Aguirre, female    DOB: [birth year], 73 y.o.   MRN: [id number]  Patient Care Team: Severa Rock HERO, FNP as PCP - General (Family Medicine)   Chief Complaint:  Medical Management of Chronic Issues (6 month follow up ) and Nasal Congestion (X 1 week ago- taking otc mucinex  )   HPI: Nicole  Aguirre is a 73 y.o. female presenting on 08/21/2023 for Medical Management of Chronic Issues (6 month follow up ) and Nasal Congestion (X 1 week ago- taking otc mucinex  )   Nicole  Aguirre is a 73 year old female with hypertension and PVCs who presents with cough and congestion.  She has been experiencing a cough and congestion for over a week, describing it as 'a lot of drainage, just a cough and drainage and stuff.' No fever is present, but she mentions a little wheezing. She has been taking Mucinex  for about a week, which she feels is helping, and continues to take Allegra  and Singulair  every night. She also has albuterol  at home for use as needed.  She has a history of hypertension and premature ventricular contractions (PVCs) and is currently under cardiology care. She was recently started on losartan, which she takes in the morning, and continues metoprolol  at night. Her blood pressure can drop to 115/60, at which point she feels 'real weak' but not dizzy. She manages these episodes by sitting down and drinking water. Her blood pressure readings have been high, with recent measurements of 175/81 and 154/90.  She has been informed that she needs a hip replacement and has previously received epidural injections for hip pain.  She is currently taking vitamin D . No fever, shortness of breath, dizziness, or ear pain. Reports a little wheezing and feeling weak when blood pressure is low.          Relevant past medical, surgical, family, and social history reviewed and updated as indicated.  Allergies and medications reviewed and updated. Data reviewed: Chart  in Epic.   Past Medical History:  Diagnosis Date   Allergy    Fibromyalgia    GERD (gastroesophageal reflux disease)    Hyperlipidemia    Hypertension    PVC (premature ventricular contraction)     Past Surgical History:  Procedure Laterality Date   ABDOMINAL HYSTERECTOMY     APPENDECTOMY  1985   CATARACT EXTRACTION, BILATERAL     CHOLECYSTECTOMY  2010    Social History   Socioeconomic History   Marital status: Married    Spouse name: Oneil   Number of children: 2   Years of education: 10th   Highest education level: GED or equivalent  Occupational History   Occupation: Dietary    Comment: Chief Executive Officer   Occupation: retired  Tobacco Use   Smoking status: Former    Current packs/day: 0.00    Average packs/day: 1 pack/day for 4.0 years (4.0 ttl pk-yrs)    Types: Cigarettes    Start date: 07/25/1972    Quit date: 07/25/1976    Years since quitting: 47.1   Smokeless tobacco: Never  Vaping Use   Vaping status: Never Used  Substance and Sexual Activity   Alcohol use: Never   Drug use: Never   Sexual activity: Yes    Birth control/protection: Surgical  Other Topics Concern   Not on file  Social History Narrative   2 sons   5 grandchildren.   Social Drivers of Health   Financial Resource Strain: Low Risk  (04/05/2023)  Overall Financial Resource Strain (CARDIA)    Difficulty of Paying Living Expenses: Not hard at all  Food Insecurity: No Food Insecurity (04/05/2023)   Hunger Vital Sign    Worried About Running Out of Food in the Last Year: Never true    Ran Out of Food in the Last Year: Never true  Transportation Needs: No Transportation Needs (04/05/2023)   PRAPARE - Administrator, Civil Service (Medical): No    Lack of Transportation (Non-Medical): No  Physical Activity: Sufficiently Active (04/05/2023)   Exercise Vital Sign    Days of Exercise per Week: 7 days    Minutes of Exercise per Session: 30 min  Stress: No Stress Concern Present  (04/05/2023)   [employer] of Occupational Health - Occupational Stress Questionnaire    Feeling of Stress : Not at all  Social Connections: Moderately Isolated (04/05/2023)   Social Connection and Isolation Panel    Frequency of Communication with Friends and Family: Once a week    Frequency of Social Gatherings with Friends and Family: Once a week    Attends Religious Services: 1 to 4 times per year    Active Member of [employer] or Organizations: Yes    Attends Banker Meetings: Never    Marital Status: Widowed  Intimate Partner Violence: Not At Risk (04/05/2023)   Humiliation, Afraid, Rape, and Kick questionnaire    Fear of Current or Ex-Partner: No    Emotionally Abused: No    Physically Abused: No    Sexually Abused: No    Outpatient Encounter Medications as of 08/21/2023  Medication Sig   albuterol  (VENTOLIN  HFA) 108 (90 Base) MCG/ACT inhaler Inhale 2 puffs into the lungs every 6 (six) hours as needed.   atorvastatin  (LIPITOR) 40 MG tablet TAKE 1 TABLET BY MOUTH AT  BEDTIME   diclofenac  Sodium (VOLTAREN ) 1 % GEL APPLY 2 GRAMS TO AFFECTED AREA 4 TIMES A DAY   fexofenadine  (ALLEGRA ) 180 MG tablet TAKE 1 TABLET BY MOUTH EVERY DAY   fluticasone  (FLONASE ) 50 MCG/ACT nasal spray USE 2 SPRAYS IN BOTH NOSTRILS  DAILY   gabapentin  (NEURONTIN ) 300 MG capsule TAKE 1 CAPSULE BY MOUTH 3 TIMES  DAILY   hyoscyamine  (LEVSIN ) 0.125 MG tablet Take 1 tablet (0.125 mg total) by mouth every 4 (four) hours as needed for cramping.   loperamide  (IMODIUM ) 2 MG capsule TAKE 1 CAPSULE BY MOUTH 4 TIMES  DAILY AS NEEDED FOR DIARRHEA OR  LOOSE STOOLS   losartan (COZAAR) 50 MG tablet Take 50 mg by mouth daily. Dr. Braden w/[facility] Once daily   Magnesium  200 MG TABS Take 1 tablet (200 mg total) by mouth daily.   metoprolol  succinate (TOPROL -XL) 25 MG 24 hr tablet TAKE 1/2 TABLET BY MOUTH DAILY   montelukast  (SINGULAIR ) 10 MG tablet TAKE 1 TABLET BY MOUTH DAILY   omeprazole  (PRILOSEC) 40 MG  capsule TAKE 1 CAPSULE BY MOUTH DAILY   ondansetron  (ZOFRAN ) 4 MG tablet TAKE 1 TABLET BY MOUTH EVERY 8  HOURS AS NEEDED FOR NAUSEA AND  VOMITING   Vitamin D , Ergocalciferol , (DRISDOL ) 1.25 MG (50000 UNIT) CAPS capsule TAKE 1 CAPSULE BY MOUTH EVERY 7  DAYS   No facility-administered encounter medications on file as of 08/21/2023.    Allergies  Allergen Reactions   Hydrochlorothiazide  Other (See Comments)    cramping  cramping cramping    cramping  cramping    Other Reaction(s): Other (See Comments), Other (See Comments)    cramping cramping  cramping  cramping  cramping cramping  cramping    Pertinent ROS per HPI, otherwise unremarkable      Objective:  BP (!) 175/84   Pulse (!) 56   Temp (!) 97.3 F (36.3 C)   Ht 4' 11 (1.499 m)   Wt 145 lb (65.8 kg)   SpO2 98%   BMI 29.29 kg/m    Wt Readings from Last 3 Encounters:  08/21/23 145 lb (65.8 kg)  06/28/23 146 lb (66.2 kg)  04/05/23 145 lb (65.8 kg)    Physical Exam Vitals and nursing note reviewed.  Constitutional:      General: She is not in acute distress.    Appearance: Normal appearance. She is overweight. She is not ill-appearing, toxic-appearing or diaphoretic.  HENT:     Head: Normocephalic and atraumatic.     Right Ear: A middle ear effusion is present. Tympanic membrane is not erythematous.     Left Ear: A middle ear effusion is present. Tympanic membrane is not erythematous.     Nose: Congestion present.     Right Sinus: No maxillary sinus tenderness or frontal sinus tenderness.     Left Sinus: No maxillary sinus tenderness or frontal sinus tenderness.     Mouth/Throat:     Mouth: Mucous membranes are moist.     Pharynx: Oropharynx is clear. Postnasal drip present. No oropharyngeal exudate or posterior oropharyngeal erythema.  Eyes:     Conjunctiva/sclera: Conjunctivae normal.     Pupils: Pupils are equal, round, and reactive to light.  Cardiovascular:     Rate and Rhythm: Normal rate and  regular rhythm.     Heart sounds: Normal heart sounds.  Pulmonary:     Effort: Pulmonary effort is normal.     Breath sounds: Normal breath sounds. No wheezing or rhonchi.  Musculoskeletal:     Cervical back: Neck supple.     Right lower leg: No edema.     Left lower leg: No edema.  Lymphadenopathy:     Cervical: No cervical adenopathy.  Skin:    General: Skin is warm and dry.     Capillary Refill: Capillary refill takes less than 2 seconds.      Neurological:     General: No focal deficit present.     Mental Status: She is alert and oriented to person, place, and time.  Psychiatric:        Mood and Affect: Mood normal.        Behavior: Behavior normal. Behavior is cooperative.        Thought Content: Thought content normal.        Judgment: Judgment normal.    Skin excision  Date/Time: 08/21/2023 1:01 PM  Performed by: Severa Rock HERO, FNP Authorized by: Severa Rock HERO, FNP   Number of Lesions: 1 Lesion 1:    Body area: trunk   Trunk location: back   Initial size (mm): 6   Final defect size (mm): 6   Malignancy: malignancy unknown     Destruction method comment: shave biopsy    Results for orders placed or performed in visit on 06/28/23  Urine Culture   Collection Time: 06/28/23 11:44 AM   Specimen: Urine   UR  Result Value Ref Range   Urine Culture, Routine Final report    Organism ID, Bacteria No growth   Urinalysis, Routine w reflex microscopic   Collection Time: 06/28/23 11:44 AM  Result Value Ref Range   Specific Gravity, UA 1.010 1.005 - 1.030  pH, UA 6.5 5.0 - 7.5   Color, UA Yellow Yellow   Appearance Ur Clear Clear   Leukocytes,UA Negative Negative   Protein,UA Negative Negative/Trace   Glucose, UA Negative Negative   Ketones, UA Negative Negative   RBC, UA Negative Negative   Bilirubin, UA Negative Negative   Urobilinogen, Ur 0.2 0.2 - 1.0 mg/dL   Nitrite, UA Negative Negative   Microscopic Examination Comment   WET PREP FOR TRICH, YEAST,  CLUE   Collection Time: 06/28/23 12:42 PM   Specimen: Vaginal Swab   Vaginal Swab  Result Value Ref Range   Trichomonas Exam Negative Negative   Yeast Exam Negative Negative   Clue Cell Exam Comment: Negative       Pertinent labs & imaging results that were available during my care of the patient were reviewed by me and considered in my medical decision making.  Assessment & Plan:  Ruqayyah  was seen today for medical management of chronic issues and nasal congestion.  Diagnoses and all orders for this visit:  Essential hypertension -     BMP8+EGFR -     CBC with Differential/Platelet  Mixed hyperlipidemia -     BMP8+EGFR  PVC (premature ventricular contraction) -     BMP8+EGFR -     CBC with Differential/Platelet  Vitamin D  deficiency -     BMP8+EGFR -     VITAMIN D  25 Hydroxy (Vit-D Deficiency, Fractures)  URI with cough and congestion -     CBC with Differential/Platelet  Back skin lesion -     Surgical pathology -     Skin excision  Immunization due -     Pneumococcal conjugate vaccine 20-valent (Prevnar 20)      Viral upper respiratory infection Symptoms include cough, drainage, and congestion for over a week. Likely viral in nature given the absence of fever and other severe symptoms. - Recommend Coricidin HBP for upper respiratory congestion - Increase water intake - Use saline nasal spray as needed - Continue Mucinex  with plenty of water - Use albuterol  inhaler as needed for wheezing - Report if symptoms worsen or if fever develops  Hypertension Blood pressure readings are elevated at 175/81 and 154/90. Losartan and metoprolol  are being used for management. Losartan may cause blood pressure to drop to 115/60, which is acceptable if asymptomatic. No dizziness reported, but weakness is noted when blood pressure is low. - Continue losartan in the morning and metoprolol  at night - Advise against using high sodium drinks like Gatorade - Encourage regular  intake of losartan to adjust to medication - Monitor for symptoms of hypotension such as dizziness or feeling faint  Back skin lesion, inflamed, for biopsy Inflamed skin lesion on the back, loose in several areas and red around the base. Requires biopsy to rule out any concerning pathology. - Perform shave biopsy of the back skin lesion - Send biopsy for pathological examination  Osteoarthritis of hip Advised to consider hip replacement but opting to delay until January. Previous epidurals have been tried for pain management.  Premature ventricular contractions Currently managed with cardiology.  General Health Maintenance Vitamin D  supplementation is ongoing. - Check vitamin D  levels - Continue vitamin D  supplementation          Continue all other maintenance medications.  Follow up plan: Return in about 6 months (around 02/21/2024), or if symptoms worsen or fail to improve, for Annual Physical.   Continue healthy lifestyle choices, including diet (rich in fruits, vegetables, and lean proteins, and  low in salt and simple carbohydrates) and exercise (at least 30 minutes of moderate physical activity daily).  Educational handout given for wound care, viral URI  The above assessment and management plan was discussed with the patient. The patient verbalized understanding of and has agreed to the management plan. Patient is aware to call the clinic if they develop any new symptoms or if symptoms persist or worsen. Patient is aware when to return to the clinic for a follow-up visit. Patient educated on when it is appropriate to go to the emergency department.   Rosaline Bruns, FNP-C [facility] [phone]

## 2023-08-22 ENCOUNTER — Ambulatory Visit: Payer: Self-pay | Admitting: Family Medicine

## 2023-08-22 ENCOUNTER — Telehealth: Payer: Self-pay

## 2023-08-22 LAB — CBC WITH DIFFERENTIAL/PLATELET
Basophils Absolute: 0 x10E3/uL (ref 0.0–0.2)
Basos: 0 %
EOS (ABSOLUTE): 0.1 x10E3/uL (ref 0.0–0.4)
Eos: 1 %
Hematocrit: 44.9 % (ref 34.0–46.6)
Hemoglobin: 14.5 g/dL (ref 11.1–15.9)
Immature Grans (Abs): 0 x10E3/uL (ref 0.0–0.1)
Immature Granulocytes: 0 %
Lymphocytes Absolute: 2 x10E3/uL (ref 0.7–3.1)
Lymphs: 29 %
MCH: 31.7 pg (ref 26.6–33.0)
MCHC: 32.3 g/dL (ref 31.5–35.7)
MCV: 98 fL — ABNORMAL HIGH (ref 79–97)
Monocytes Absolute: 0.6 x10E3/uL (ref 0.1–0.9)
Monocytes: 9 %
Neutrophils Absolute: 4.3 x10E3/uL (ref 1.4–7.0)
Neutrophils: 60 %
Platelets: 264 x10E3/uL (ref 150–450)
RBC: 4.57 x10E6/uL (ref 3.77–5.28)
RDW: 13.1 % (ref 11.7–15.4)
WBC: 7 x10E3/uL (ref 3.4–10.8)

## 2023-08-22 LAB — SURGICAL PATHOLOGY

## 2023-08-22 LAB — BMP8+EGFR
BUN/Creatinine Ratio: 19 (ref 12–28)
BUN: 17 mg/dL (ref 8–27)
CO2: 23 mmol/L (ref 20–29)
Calcium: 10 mg/dL (ref 8.7–10.3)
Chloride: 103 mmol/L (ref 96–106)
Creatinine, Ser: 0.89 mg/dL (ref 0.57–1.00)
Glucose: 86 mg/dL (ref 70–99)
Potassium: 4.6 mmol/L (ref 3.5–5.2)
Sodium: 139 mmol/L (ref 134–144)
eGFR: 69 mL/min/1.73 (ref >59)

## 2023-08-22 LAB — VITAMIN D 25 HYDROXY (VIT D DEFICIENCY, FRACTURES): Vit D, 25-Hydroxy: 53.7 ng/mL (ref 30.0–100.0)

## 2023-08-22 NOTE — Telephone Encounter (Signed)
 I called and spoke with patient and reviewed pathology results with her per providers notes. Patient voiced understanding.

## 2023-08-22 NOTE — Telephone Encounter (Signed)
 noted

## 2023-08-22 NOTE — Telephone Encounter (Signed)
 Copied from CRM #[id number]. Topic: Clinical - Lab/Test Results >> Aug 22, 2023  1:19 PM Turkey B wrote: Reason for CRM: Pt called in for results of pathology test done yesterday for her back. I let her know th results aren't in yet. Please cb when they are in

## 2023-08-26 ENCOUNTER — Other Ambulatory Visit: Payer: Self-pay | Admitting: Family Medicine

## 2023-08-28 ENCOUNTER — Other Ambulatory Visit: Payer: Self-pay | Admitting: Family Medicine

## 2023-08-28 DIAGNOSIS — K582 Mixed irritable bowel syndrome: Secondary | ICD-10-CM

## 2023-09-20 DIAGNOSIS — H40033 Anatomical narrow angle, bilateral: Secondary | ICD-10-CM | POA: Diagnosis not present

## 2023-09-20 DIAGNOSIS — H04123 Dry eye syndrome of bilateral lacrimal glands: Secondary | ICD-10-CM | POA: Diagnosis not present

## 2023-10-19 DIAGNOSIS — H04123 Dry eye syndrome of bilateral lacrimal glands: Secondary | ICD-10-CM | POA: Diagnosis not present

## 2023-10-22 ENCOUNTER — Other Ambulatory Visit: Payer: Self-pay | Admitting: Family Medicine

## 2023-10-22 DIAGNOSIS — J309 Allergic rhinitis, unspecified: Secondary | ICD-10-CM

## 2023-10-29 ENCOUNTER — Ambulatory Visit: Payer: Self-pay | Admitting: Family Medicine

## 2023-10-29 DIAGNOSIS — I10 Essential (primary) hypertension: Secondary | ICD-10-CM | POA: Diagnosis not present

## 2023-10-29 DIAGNOSIS — I493 Ventricular premature depolarization: Secondary | ICD-10-CM | POA: Diagnosis not present

## 2023-10-29 DIAGNOSIS — E782 Mixed hyperlipidemia: Secondary | ICD-10-CM | POA: Diagnosis not present

## 2023-10-29 NOTE — Telephone Encounter (Signed)
 FYI Only or Action Required?: FYI only for provider.  Patient was last seen in primary care on 08/21/2023 by Severa Rock HERO, FNP.  Called Nurse Triage reporting Cough.  Symptoms began several weeks ago.  Interventions attempted: Rest, hydration, or home remedies.  Symptoms are: gradually worsening.  Triage Disposition: See Physician Within 24 Hours  Patient/caregiver understands and will follow disposition?: Yes                  Reason for Disposition  [1] Continuous (nonstop) coughing interferes with work or school AND [2] no improvement using cough treatment per Care Advice  Answer Assessment - Initial Assessment Questions Appt scheduled tomorrow with PCP.    1. ONSET: When did the cough begin?      On going but coughing worse 2. SEVERITY: How bad is the cough today?      Coughing spells but does not vomit 3. SPUTUM: Describe the color of your sputum (e.g., none, dry cough; clear, white, yellow, green)     White thick, flakes of yellow  4. HEMOPTYSIS: Are you coughing up any blood? If Yes, ask: How much? (e.g., flecks, streaks, tablespoons, etc.)     na 5. DIFFICULTY BREATHING: Are you having difficulty breathing? If Yes, ask: How bad is it? (e.g., mild, moderate, severe)      No difficulty breathing  6. FEVER: Do you have a fever? If Yes, ask: What is your temperature, how was it measured, and when did it start?     na 7. CARDIAC HISTORY: Do you have any history of heart disease? (e.g., heart attack, congestive heart failure)      na 8. LUNG HISTORY: Do you have any history of lung disease?  (e.g., pulmonary embolus, asthma, emphysema)     Hx asthma  9. PE RISK FACTORS: Do you have a history of blood clots? (or: recent major surgery, recent prolonged travel, bedridden)     na 10. OTHER SYMPTOMS: Do you have any other symptoms? (e.g., runny nose, wheezing, chest pain)       Cough worsening, runny nose, sneezing x 2 weeks but  allergies for months . Denies chest pain no difficulty breathing no fever  11. PREGNANCY: Is there any chance you are pregnant? When was your last menstrual period?       na 12. TRAVEL: Have you traveled out of the country in the last month? (e.g., travel history, exposures)       na  Protocols used: Cough - Acute Productive-A-AH

## 2023-10-30 ENCOUNTER — Ambulatory Visit (INDEPENDENT_AMBULATORY_CARE_PROVIDER_SITE_OTHER): Admitting: Family Medicine

## 2023-10-30 ENCOUNTER — Encounter: Payer: Self-pay | Admitting: Family Medicine

## 2023-10-30 VITALS — BP 146/87 | HR 72 | Temp 97.9°F | Ht 59.0 in | Wt 146.2 lb

## 2023-10-30 DIAGNOSIS — J069 Acute upper respiratory infection, unspecified: Secondary | ICD-10-CM

## 2023-10-30 DIAGNOSIS — J301 Allergic rhinitis due to pollen: Secondary | ICD-10-CM

## 2023-10-30 MED ORDER — BENZONATATE 200 MG PO CAPS: 200.0000 mg | ORAL_CAPSULE | Freq: Two times a day (BID) | ORAL | 0 refills | Status: DC | PRN

## 2023-10-30 MED ORDER — CETIRIZINE HCL 10 MG PO TABS: 10.0000 mg | ORAL_TABLET | Freq: Every day | ORAL | 11 refills | Status: AC

## 2023-10-30 MED ORDER — DOXYCYCLINE HYCLATE 100 MG PO TABS: 100.0000 mg | ORAL_TABLET | Freq: Two times a day (BID) | ORAL | 0 refills | Status: AC

## 2023-10-30 NOTE — Progress Notes (Signed)
 Subjective:  Patient ID: Nicole  Aguirre, female    DOB: 05/29/1950, 73 y.o.   MRN: [id number]  Patient Care Team: Severa Rock HERO, FNP as PCP - General (Family Medicine)   Chief Complaint:  Nasal Congestion, Cough, sneezing , and Headache (X 1 month- no otc helping )   HPI: Nicole  Aguirre is a 73 y.o. female presenting on 10/30/2023 for Nasal Congestion, Cough, sneezing , and Headache (X 1 month- no otc helping )    Nicole  Aguirre is a 73 year old female who presents with persistent upper respiratory symptoms for one month.  She has been experiencing a runny nose, cough, and significant postnasal drainage for the past month. Despite using Mucinex  and OTC cold medications, her symptoms have persisted. She has used about four to six boxes of cold medications and two or three boxes of Mucinex  without relief.  No fever or shortness of breath is present. However, she notes significant drainage and congestion, and her cough has recently become productive with thick, white sputum. She also reports increased fatigue.  Her current medications include daily allergy medications, specifically Allegra , Flonase , and Singulair  every night. She has previously tried Xyzal  and Zyrtec , with Zyrtec  providing some relief before switching to Allegra .  She mentions that her ears have been hurting. She continues to manage her symptoms at home but has not seen improvement over the past month.          Relevant past medical, surgical, family, and social history reviewed and updated as indicated.  Allergies and medications reviewed and updated. Data reviewed: Chart in Epic.   Past Medical History:  Diagnosis Date   Allergy    Fibromyalgia    GERD (gastroesophageal reflux disease)    Hyperlipidemia    Hypertension    PVC (premature ventricular contraction)     Past Surgical History:  Procedure Laterality Date   ABDOMINAL HYSTERECTOMY     APPENDECTOMY  1985   CATARACT EXTRACTION,  BILATERAL     CHOLECYSTECTOMY  2010    Social History   Socioeconomic History   Marital status: Married    Spouse name: Oneil   Number of children: 2   Years of education: 10th   Highest education level: GED or equivalent  Occupational History   Occupation: Dietary    Comment: Chief Executive Officer   Occupation: retired  Tobacco Use   Smoking status: Former    Current packs/day: 0.00    Average packs/day: 1 pack/day for 4.0 years (4.0 ttl pk-yrs)    Types: Cigarettes    Start date: 07/25/1972    Quit date: 07/25/1976    Years since quitting: 47.2   Smokeless tobacco: Never  Vaping Use   Vaping status: Never Used  Substance and Sexual Activity   Alcohol use: Never   Drug use: Never   Sexual activity: Yes    Birth control/protection: Surgical  Other Topics Concern   Not on file  Social History Narrative   2 sons   5 grandchildren.   Social Drivers of Corporate investment banker Strain: Low Risk  (04/05/2023)   Overall Financial Resource Strain (CARDIA)    Difficulty of Paying Living Expenses: Not hard at all  Food Insecurity: No Food Insecurity (04/05/2023)   Hunger Vital Sign    Worried About Running Out of Food in the Last Year: Never true    Ran Out of Food in the Last Year: Never true  Transportation Needs: No Transportation Needs (04/05/2023)   PRAPARE -  Administrator, Civil Service (Medical): No    Lack of Transportation (Non-Medical): No  Physical Activity: Sufficiently Active (04/05/2023)   Exercise Vital Sign    Days of Exercise per Week: 7 days    Minutes of Exercise per Session: 30 min  Stress: No Stress Concern Present (04/05/2023)   [employer] of Occupational Health - Occupational Stress Questionnaire    Feeling of Stress : Not at all  Social Connections: Moderately Isolated (04/05/2023)   Social Connection and Isolation Panel    Frequency of Communication with Friends and Family: Once a week    Frequency of Social Gatherings with Friends and  Family: Once a week    Attends Religious Services: 1 to 4 times per year    Active Member of [employer] or Organizations: Yes    Attends Banker Meetings: Never    Marital Status: Widowed  Intimate Partner Violence: Not At Risk (04/05/2023)   Humiliation, Afraid, Rape, and Kick questionnaire    Fear of Current or Ex-Partner: No    Emotionally Abused: No    Physically Abused: No    Sexually Abused: No    Outpatient Encounter Medications as of 10/30/2023  Medication Sig   albuterol  (VENTOLIN  HFA) 108 (90 Base) MCG/ACT inhaler Inhale 2 puffs into the lungs every 6 (six) hours as needed.   atorvastatin  (LIPITOR) 40 MG tablet TAKE 1 TABLET BY MOUTH AT  BEDTIME   benzonatate  (TESSALON ) 200 MG capsule Take 1 capsule (200 mg total) by mouth 2 (two) times daily as needed for cough.   cetirizine  (ZYRTEC ) 10 MG tablet Take 1 tablet (10 mg total) by mouth daily.   diclofenac  Sodium (VOLTAREN ) 1 % GEL APPLY 2 GRAMS TO AFFECTED AREA 4 TIMES A DAY   doxycycline  (VIBRA -TABS) 100 MG tablet Take 1 tablet (100 mg total) by mouth 2 (two) times daily for 10 days. 1 po bid   fluticasone  (FLONASE ) 50 MCG/ACT nasal spray USE 2 SPRAYS IN BOTH NOSTRILS  DAILY   gabapentin  (NEURONTIN ) 300 MG capsule TAKE 1 CAPSULE BY MOUTH 3 TIMES  DAILY   hyoscyamine  (LEVSIN ) 0.125 MG tablet Take 1 tablet (0.125 mg total) by mouth every 4 (four) hours as needed for cramping.   loperamide  (IMODIUM ) 2 MG capsule TAKE 1 CAPSULE BY MOUTH 4 TIMES  DAILY AS NEEDED FOR DIARRHEA OR  LOOSE STOOLS   losartan (COZAAR) 50 MG tablet Take 50 mg by mouth daily. Dr. Braden w/[facility] Once daily   Magnesium  200 MG TABS Take 1 tablet (200 mg total) by mouth daily.   metoprolol  succinate (TOPROL -XL) 25 MG 24 hr tablet TAKE 1/2 TABLET BY MOUTH DAILY   montelukast  (SINGULAIR ) 10 MG tablet TAKE 1 TABLET BY MOUTH DAILY   omeprazole  (PRILOSEC) 40 MG capsule TAKE 1 CAPSULE BY MOUTH DAILY   ondansetron  (ZOFRAN ) 4 MG tablet TAKE 1 TABLET BY  MOUTH EVERY 8  HOURS AS NEEDED FOR NAUSEA AND  VOMITING   Vitamin D , Ergocalciferol , (DRISDOL ) 1.25 MG (50000 UNIT) CAPS capsule TAKE 1 CAPSULE BY MOUTH EVERY 7  DAYS   [DISCONTINUED] fexofenadine  (ALLEGRA ) 180 MG tablet TAKE 1 TABLET BY MOUTH EVERY DAY   No facility-administered encounter medications on file as of 10/30/2023.    Allergies  Allergen Reactions   Hydrochlorothiazide  Other (See Comments)    cramping  cramping cramping    cramping  cramping    Other Reaction(s): Other (See Comments), Other (See Comments)    cramping cramping  cramping  cramping  cramping cramping  cramping    Pertinent ROS per HPI, otherwise unremarkable      Objective:  BP (!) 146/87   Pulse 72   Temp 97.9 F (36.6 C)   Ht 4' 11 (1.499 m)   Wt 146 lb 3.2 oz (66.3 kg)   SpO2 99%   BMI 29.53 kg/m    Wt Readings from Last 3 Encounters:  10/30/23 146 lb 3.2 oz (66.3 kg)  08/21/23 145 lb (65.8 kg)  06/28/23 146 lb (66.2 kg)    Physical Exam Vitals and nursing note reviewed.  Constitutional:      General: She is not in acute distress.    Appearance: Normal appearance. She is well-developed and overweight. She is not ill-appearing, toxic-appearing or diaphoretic.  HENT:     Head: Normocephalic and atraumatic.     Right Ear: A middle ear effusion is present. Tympanic membrane is not erythematous.     Left Ear: A middle ear effusion is present. Tympanic membrane is not erythematous.     Nose: Congestion present.     Right Turbinates: Enlarged.     Right Sinus: Maxillary sinus tenderness and frontal sinus tenderness present.     Left Sinus: Maxillary sinus tenderness and frontal sinus tenderness present.     Mouth/Throat:     Lips: Pink.     Mouth: Mucous membranes are moist.     Pharynx: Posterior oropharyngeal erythema and postnasal drip present. No oropharyngeal exudate.  Cardiovascular:     Rate and Rhythm: Normal rate and regular rhythm.     Heart sounds: Normal heart  sounds.  Pulmonary:     Effort: Pulmonary effort is normal.     Breath sounds: Normal breath sounds.  Musculoskeletal:     Cervical back: Neck supple.  Lymphadenopathy:     Cervical: Cervical adenopathy present.  Skin:    General: Skin is warm and dry.     Capillary Refill: Capillary refill takes less than 2 seconds.  Neurological:     General: No focal deficit present.     Mental Status: She is alert and oriented to person, place, and time.  Psychiatric:        Mood and Affect: Mood normal.        Behavior: Behavior normal. Behavior is cooperative.        Thought Content: Thought content normal.        Judgment: Judgment normal.     Results for orders placed or performed in visit on 08/21/23  Surgical pathology   Collection Time: 08/21/23 10:40 AM  Result Value Ref Range   SURGICAL PATHOLOGY      SURGICAL PATHOLOGY CASE: [id number] PATIENT: Nicole  Aguirre Surgical Pathology Report     Clinical History: back skin lesion (cm)     FINAL MICROSCOPIC DIAGNOSIS:  A. SKIN, BACK, EXCISION: -  Seborrheic keratosis.    GROSS DESCRIPTION:  Received fresh is a 1.1 x 0.8 x 0.3 cm tan, crusted skin shave specimen, without a grossly distinct lesion.  The resection margin is inked blue and the specimen is serially sectioned revealing an underlying tan cut surface.  The specimen is entirely submitted in 1 block. (KW, 08/21/2023)  Final Diagnosis performed by Mark LeGolvan DO.   Electronically signed 08/22/2023 Technical component performed at [employer]. [facility], [address] [place], [place] [address].  Professional component performed at [facility], [address] [place], [place] [address].  Immunohistochemistry Technical component (if applicable) was performed  at [facility] [address], [address], [place], [place] [address].   IMMUNOHISTOCHEMISTRY DISCLAIMER (if applicable): Some of these  immunohistochemical stains may have been developed and the performance characteristics determine by [facility]. Some may not have been cleared or approved by the U.S. Food and Drug Administration. The FDA has determined that such clearance or approval is not necessary. This test is used for clinical purposes. It should not be regarded as investigational or for research. This laboratory is certified under the Clinical Laboratory Improvement Amendments of 1988 (CLIA-88) as qualified to perform high complexity clinical laboratory testing.  The controls stained appropriately.   IHC stains are performed on formalin fixed, paraffin embedded tissue using a 3,3diaminobenzidine (DAB) chromogen and Leica Bond Autostainer System. The staining intensity of the nucleus is score manually and is reported as the percentage of tumor cell nuclei demonstrating specific nuclear stain ing. The specimens are fixed in 10% Neutral Formalin for at least 6 hours and up to 72hrs. These tests are validated on decalcified tissue. Results should be interpreted with caution given the possibility of false negative results on decalcified specimens. Antibody Clones are as follows ER-clone 3F, PR-clone 16, Ki67- clone MM1. Some of these immunohistochemical stains may have been developed and the performance characteristics determined by [facility].   BMP8+EGFR   Collection Time: 08/21/23 11:03 AM  Result Value Ref Range   Glucose 86 70 - 99 mg/dL   BUN 17 8 - 27 mg/dL   Creatinine, Ser 9.10 0.57 - 1.00 mg/dL   eGFR 69 >40 fO/fpw/8.26   BUN/Creatinine Ratio 19 12 - 28   Sodium 139 134 - 144 mmol/L   Potassium 4.6 3.5 - 5.2 mmol/L   Chloride 103 96 - 106 mmol/L   CO2 23 20 - 29 mmol/L   Calcium  10.0 8.7 - 10.3 mg/dL  CBC with Differential/Platelet   Collection Time: 08/21/23 11:03 AM  Result Value Ref Range   WBC 7.0 3.4 - 10.8 x10E3/uL   RBC 4.57 3.77 - 5.28 x10E6/uL   Hemoglobin 14.5 11.1  - 15.9 g/dL   Hematocrit 55.0 65.9 - 46.6 %   MCV 98 (H) 79 - 97 fL   MCH 31.7 26.6 - 33.0 pg   MCHC 32.3 31.5 - 35.7 g/dL   RDW 86.8 88.2 - 84.5 %   Platelets 264 150 - 450 x10E3/uL   Neutrophils 60 Not Estab. %   Lymphs 29 Not Estab. %   Monocytes 9 Not Estab. %   Eos 1 Not Estab. %   Basos 0 Not Estab. %   Neutrophils Absolute 4.3 1.4 - 7.0 x10E3/uL   Lymphocytes Absolute 2.0 0.7 - 3.1 x10E3/uL   Monocytes Absolute 0.6 0.1 - 0.9 x10E3/uL   EOS (ABSOLUTE) 0.1 0.0 - 0.4 x10E3/uL   Basophils Absolute 0.0 0.0 - 0.2 x10E3/uL   Immature Granulocytes 0 Not Estab. %   Immature Grans (Abs) 0.0 0.0 - 0.1 x10E3/uL  VITAMIN D  25 Hydroxy (Vit-D Deficiency, Fractures)   Collection Time: 08/21/23 11:03 AM  Result Value Ref Range   Vit D, 25-Hydroxy 53.7 30.0 - 100.0 ng/mL       Pertinent labs & imaging results that were available during my care of the patient were reviewed by me and considered in my medical decision making.  Assessment & Plan:  Nicole Aguirre  was seen today for nasal congestion, cough, sneezing  and headache.  Diagnoses and all orders for this visit:  URI with cough and congestion -  doxycycline  (VIBRA -TABS) 100 MG tablet; Take 1 tablet (100 mg total) by mouth 2 (two) times daily for 10 days. 1 po bid -     cetirizine  (ZYRTEC ) 10 MG tablet; Take 1 tablet (10 mg total) by mouth daily. -     benzonatate  (TESSALON ) 200 MG capsule; Take 1 capsule (200 mg total) by mouth 2 (two) times daily as needed for cough.  Seasonal allergic rhinitis due to pollen -     cetirizine  (ZYRTEC ) 10 MG tablet; Take 1 tablet (10 mg total) by mouth daily.       Acute upper respiratory infection with cough and postnasal drip Symptoms have persisted for a month, including runny nose, productive cough with thick white sputum, and significant postnasal drip. No fever or shortness of breath. Fluid behind ears causing ear pain. Symptoms unresponsive to over-the-counter treatments. - Prescribe  doxycycline  for infection - Prescribe Tessalon  for cough - Advise to continue Mucinex  with increased water intake - Instruct to report if symptoms worsen or do not improve within 3-4 days  Allergic rhinitis Chronic condition managed with allergy medications. Currently on Allegra , considering switch due to duration of use. Previous trials of Xyzal  were ineffective, but Zyrtec  was helpful. Insurance covers cetirizine , the generic form of Zyrtec . - Switch from Allegra  to Zyrtec  (cetirizine ) - Continue Flonase  - Continue Singulair  nightly - Allow use of current Allegra  supply before switching to Zyrtec           Continue all other maintenance medications.  Follow up plan: Return if symptoms worsen or fail to improve.   Continue healthy lifestyle choices, including diet (rich in fruits, vegetables, and lean proteins, and low in salt and simple carbohydrates) and exercise (at least 30 minutes of moderate physical activity daily).  Educational handout given for URI  The above assessment and management plan was discussed with the patient. The patient verbalized understanding of and has agreed to the management plan. Patient is aware to call the clinic if they develop any new symptoms or if symptoms persist or worsen. Patient is aware when to return to the clinic for a follow-up visit. Patient educated on when it is appropriate to go to the emergency department.   Nicole Bruns, FNP-C [facility] [phone]

## 2023-11-02 ENCOUNTER — Ambulatory Visit

## 2023-11-05 ENCOUNTER — Telehealth: Payer: Self-pay | Admitting: Family Medicine

## 2023-11-05 NOTE — Telephone Encounter (Signed)
 Pt states she isn't feeling any better. Advised pt she ntbs in office for re-evaluation so scheduled with PCP 11/07/23.

## 2023-11-05 NOTE — Telephone Encounter (Signed)
 Copied from CRM [id number]. Topic: Clinical - Medical Advice >> Nov 05, 2023  2:13 PM Delon DASEN wrote: Reason for CRM: Not feeling better, still have a lot of drainage and ears are hurting, asking if Rock wants to change antibiotics- [phone]

## 2023-11-07 ENCOUNTER — Ambulatory Visit (INDEPENDENT_AMBULATORY_CARE_PROVIDER_SITE_OTHER): Admitting: Family Medicine

## 2023-11-07 ENCOUNTER — Encounter: Payer: Self-pay | Admitting: Family Medicine

## 2023-11-07 VITALS — BP 166/92 | HR 64 | Temp 97.4°F | Ht 59.0 in | Wt 147.0 lb

## 2023-11-07 DIAGNOSIS — J0140 Acute pansinusitis, unspecified: Secondary | ICD-10-CM

## 2023-11-07 DIAGNOSIS — J069 Acute upper respiratory infection, unspecified: Secondary | ICD-10-CM | POA: Diagnosis not present

## 2023-11-07 MED ORDER — AMOXICILLIN-POT CLAVULANATE 875-125 MG PO TABS: 1.0000 | ORAL_TABLET | Freq: Two times a day (BID) | ORAL | 0 refills | Status: DC

## 2023-11-07 NOTE — Progress Notes (Signed)
 Subjective:  Patient ID: Nicole Aguirre, female    DOB: 04-Feb-1950, 73 y.o.   MRN: [id number]  Patient Care Team: Severa Rock HERO, FNP as PCP - General (Family Medicine)   Chief Complaint:  Headache, Facial Pain, Cough, and Nasal Congestion (Was seen on 10/13 and states she is  no bettter)   HPI: Nicole Aguirre is a 73 y.o. female presenting on 11/07/2023 for Headache, Facial Pain, Cough, and Nasal Congestion (Was seen on 10/13 and states she is  no bettter)    Nicole Aguirre is a 73 year old female who presents with persistent upper respiratory symptoms despite treatment.  She has been experiencing ongoing symptoms including headache, nasal pain, and severe ear pain that has now extended down her neck. She feels worse than during her initial visit.  She has been taking doxycycline  but has two and a half days remaining in the course. It is not providing relief.  Her current medication regimen includes Mucinex , Nasonex in the morning, Allegra , Singulair  at night, and Flonase  every morning.          Relevant past medical, surgical, family, and social history reviewed and updated as indicated.  Allergies and medications reviewed and updated. Data reviewed: Chart in Epic.   Past Medical History:  Diagnosis Date   Allergy    Fibromyalgia    GERD (gastroesophageal reflux disease)    Hyperlipidemia    Hypertension    PVC (premature ventricular contraction)     Past Surgical History:  Procedure Laterality Date   ABDOMINAL HYSTERECTOMY     APPENDECTOMY  1985   CATARACT EXTRACTION, BILATERAL     CHOLECYSTECTOMY  2010    Social History   Socioeconomic History   Marital status: Married    Spouse name: Oneil   Number of children: 2   Years of education: 10th   Highest education level: GED or equivalent  Occupational History   Occupation: Dietary    Comment: Chief Executive Officer   Occupation: retired  Tobacco Use   Smoking status: Former    Current packs/day:  0.00    Average packs/day: 1 pack/day for 4.0 years (4.0 ttl pk-yrs)    Types: Cigarettes    Start date: 07/25/1972    Quit date: 07/25/1976    Years since quitting: 47.3   Smokeless tobacco: Never  Vaping Use   Vaping status: Never Used  Substance and Sexual Activity   Alcohol use: Never   Drug use: Never   Sexual activity: Yes    Birth control/protection: Surgical  Other Topics Concern   Not on file  Social History Narrative   2 sons   5 grandchildren.   Social Drivers of Corporate investment banker Strain: Low Risk  (04/05/2023)   Overall Financial Resource Strain (CARDIA)    Difficulty of Paying Living Expenses: Not hard at all  Food Insecurity: No Food Insecurity (04/05/2023)   Hunger Vital Sign    Worried About Running Out of Food in the Last Year: Never true    Ran Out of Food in the Last Year: Never true  Transportation Needs: No Transportation Needs (04/05/2023)   PRAPARE - Administrator, Civil Service (Medical): No    Lack of Transportation (Non-Medical): No  Physical Activity: Sufficiently Active (04/05/2023)   Exercise Vital Sign    Days of Exercise per Week: 7 days    Minutes of Exercise per Session: 30 min  Stress: No Stress Concern Present (04/05/2023)   [employer]  of Occupational Health - Occupational Stress Questionnaire    Feeling of Stress : Not at all  Social Connections: Moderately Isolated (04/05/2023)   Social Connection and Isolation Panel    Frequency of Communication with Friends and Family: Once a week    Frequency of Social Gatherings with Friends and Family: Once a week    Attends Religious Services: 1 to 4 times per year    Active Member of [employer] or Organizations: Yes    Attends Banker Meetings: Never    Marital Status: Widowed  Intimate Partner Violence: Not At Risk (04/05/2023)   Humiliation, Afraid, Rape, and Kick questionnaire    Fear of Current or Ex-Partner: No    Emotionally Abused: No    Physically  Abused: No    Sexually Abused: No    Outpatient Encounter Medications as of 11/07/2023  Medication Sig   albuterol  (VENTOLIN  HFA) 108 (90 Base) MCG/ACT inhaler Inhale 2 puffs into the lungs every 6 (six) hours as needed.   amoxicillin -clavulanate (AUGMENTIN ) 875-125 MG tablet Take 1 tablet by mouth 2 (two) times daily for 7 days.   atorvastatin  (LIPITOR) 40 MG tablet TAKE 1 TABLET BY MOUTH AT  BEDTIME   benzonatate  (TESSALON ) 200 MG capsule Take 1 capsule (200 mg total) by mouth 2 (two) times daily as needed for cough.   cetirizine  (ZYRTEC ) 10 MG tablet Take 1 tablet (10 mg total) by mouth daily.   diclofenac  Sodium (VOLTAREN ) 1 % GEL APPLY 2 GRAMS TO AFFECTED AREA 4 TIMES A DAY   doxycycline  (VIBRA -TABS) 100 MG tablet Take 1 tablet (100 mg total) by mouth 2 (two) times daily for 10 days. 1 po bid   fluticasone  (FLONASE ) 50 MCG/ACT nasal spray USE 2 SPRAYS IN BOTH NOSTRILS  DAILY   gabapentin  (NEURONTIN ) 300 MG capsule TAKE 1 CAPSULE BY MOUTH 3 TIMES  DAILY   hyoscyamine  (LEVSIN ) 0.125 MG tablet Take 1 tablet (0.125 mg total) by mouth every 4 (four) hours as needed for cramping.   loperamide  (IMODIUM ) 2 MG capsule TAKE 1 CAPSULE BY MOUTH 4 TIMES  DAILY AS NEEDED FOR DIARRHEA OR  LOOSE STOOLS   losartan (COZAAR) 50 MG tablet Take 50 mg by mouth daily. Dr. Braden w/[facility] Once daily   Magnesium  200 MG TABS Take 1 tablet (200 mg total) by mouth daily.   metoprolol  succinate (TOPROL -XL) 25 MG 24 hr tablet TAKE 1/2 TABLET BY MOUTH DAILY   montelukast  (SINGULAIR ) 10 MG tablet TAKE 1 TABLET BY MOUTH DAILY   omeprazole  (PRILOSEC) 40 MG capsule TAKE 1 CAPSULE BY MOUTH DAILY   ondansetron  (ZOFRAN ) 4 MG tablet TAKE 1 TABLET BY MOUTH EVERY 8  HOURS AS NEEDED FOR NAUSEA AND  VOMITING   Vitamin D , Ergocalciferol , (DRISDOL ) 1.25 MG (50000 UNIT) CAPS capsule TAKE 1 CAPSULE BY MOUTH EVERY 7  DAYS   No facility-administered encounter medications on file as of 11/07/2023.    Allergies  Allergen  Reactions   Hydrochlorothiazide  Other (See Comments)    cramping  cramping cramping    cramping  cramping    Other Reaction(s): Other (See Comments), Other (See Comments)    cramping cramping  cramping    cramping  cramping cramping  cramping    Pertinent ROS per HPI, otherwise unremarkable      Objective:  BP (!) 166/92   Pulse 64   Temp (!) 97.4 F (36.3 C)   Ht 4' 11 (1.499 m)   Wt 147 lb (66.7 kg)   SpO2 99%  BMI 29.69 kg/m    Wt Readings from Last 3 Encounters:  11/07/23 147 lb (66.7 kg)  10/30/23 146 lb 3.2 oz (66.3 kg)  08/21/23 145 lb (65.8 kg)    Physical Exam Vitals and nursing note reviewed.  Constitutional:      General: She is not in acute distress.    Appearance: Normal appearance. She is well-developed, well-groomed and overweight. She is not ill-appearing, toxic-appearing or diaphoretic.  HENT:     Head: Normocephalic and atraumatic.     Jaw: There is normal jaw occlusion.     Right Ear: Hearing normal. A middle ear effusion is present.     Left Ear: Hearing normal. A middle ear effusion is present.     Nose: Congestion present.     Right Turbinates: Enlarged.     Left Turbinates: Enlarged.     Right Sinus: Maxillary sinus tenderness and frontal sinus tenderness present.     Left Sinus: Maxillary sinus tenderness and frontal sinus tenderness present.     Mouth/Throat:     Lips: Pink.     Mouth: Mucous membranes are moist.     Pharynx: Oropharynx is clear. Uvula midline. Posterior oropharyngeal erythema and postnasal drip present. No oropharyngeal exudate.  Eyes:     General: Lids are normal.     Extraocular Movements: Extraocular movements intact.     Conjunctiva/sclera: Conjunctivae normal.     Pupils: Pupils are equal, round, and reactive to light.  Neck:     Thyroid : No thyroid  mass, thyromegaly or thyroid  tenderness.     Vascular: No carotid bruit or JVD.     Trachea: Trachea and phonation normal.  Cardiovascular:     Rate and  Rhythm: Normal rate and regular rhythm.     Chest Wall: PMI is not displaced.     Pulses: Normal pulses.     Heart sounds: Normal heart sounds. No murmur heard.    No friction rub. No gallop.  Pulmonary:     Effort: Pulmonary effort is normal. No respiratory distress.     Breath sounds: Normal breath sounds. No wheezing.  Abdominal:     General: Bowel sounds are normal. There is no distension or abdominal bruit.     Palpations: Abdomen is soft. There is no hepatomegaly or splenomegaly.     Tenderness: There is no abdominal tenderness. There is no right CVA tenderness or left CVA tenderness.     Hernia: No hernia is present.  Musculoskeletal:        General: Normal range of motion.     Cervical back: Normal range of motion and neck supple.     Right lower leg: No edema.     Left lower leg: No edema.  Lymphadenopathy:     Cervical: No cervical adenopathy.  Skin:    General: Skin is warm and dry.     Capillary Refill: Capillary refill takes less than 2 seconds.     Coloration: Skin is not cyanotic, jaundiced or pale.     Findings: No rash.  Neurological:     General: No focal deficit present.     Mental Status: She is alert and oriented to person, place, and time.     Sensory: Sensation is intact.     Motor: Motor function is intact.     Coordination: Coordination is intact.     Gait: Gait is intact.     Deep Tendon Reflexes: Reflexes are normal and symmetric.  Psychiatric:  Attention and Perception: Attention and perception normal.        Mood and Affect: Mood and affect normal.        Speech: Speech normal.        Behavior: Behavior normal. Behavior is cooperative.        Thought Content: Thought content normal.        Cognition and Memory: Cognition and memory normal.        Judgment: Judgment normal.      Results for orders placed or performed in visit on 08/21/23  Surgical pathology   Collection Time: 08/21/23 10:40 AM  Result Value Ref Range   SURGICAL  PATHOLOGY      SURGICAL PATHOLOGY CASE: [id number] PATIENT: Nicole Aguirre Surgical Pathology Report     Clinical History: back skin lesion (cm)     FINAL MICROSCOPIC DIAGNOSIS:  A. SKIN, BACK, EXCISION: -  Seborrheic keratosis.    GROSS DESCRIPTION:  Received fresh is a 1.1 x 0.8 x 0.3 cm tan, crusted skin shave specimen, without a grossly distinct lesion.  The resection margin is inked blue and the specimen is serially sectioned revealing an underlying tan cut surface.  The specimen is entirely submitted in 1 block. (KW, 08/21/2023)  Final Diagnosis performed by Mark LeGolvan DO.   Electronically signed 08/22/2023 Technical component performed at [employer]. [facility], [address] [place], [place] [address].  Professional component performed at [facility], [address] [place], [place] [address].  Immunohistochemistry Technical component (if applicable) was performed at [facility]. [address], [address], [place], [place] [address].   IMMUNOHISTOCHEMISTRY DISCLAIMER (if applicable): Some of these immunohistochemical stains may have been developed and the performance characteristics determine by [facility]. Some may not have been cleared or approved by the U.S. Food and Drug Administration. The FDA has determined that such clearance or approval is not necessary. This test is used for clinical purposes. It should not be regarded as investigational or for research. This laboratory is certified under the Clinical Laboratory Improvement Amendments of 1988 (CLIA-88) as qualified to perform high complexity clinical laboratory testing.  The controls stained appropriately.   IHC stains are performed on formalin fixed, paraffin embedded tissue using a 3,3diaminobenzidine (DAB) chromogen and Leica Bond Autostainer System. The staining intensity of the nucleus is score manually and is reported as  the percentage of tumor cell nuclei demonstrating specific nuclear stain ing. The specimens are fixed in 10% Neutral Formalin for at least 6 hours and up to 72hrs. These tests are validated on decalcified tissue. Results should be interpreted with caution given the possibility of false negative results on decalcified specimens. Antibody Clones are as follows ER-clone 61F, PR-clone 16, Ki67- clone MM1. Some of these immunohistochemical stains may have been developed and the performance characteristics determined by [facility].   BMP8+EGFR   Collection Time: 08/21/23 11:03 AM  Result Value Ref Range   Glucose 86 70 - 99 mg/dL   BUN 17 8 - 27 mg/dL   Creatinine, Ser 9.10 0.57 - 1.00 mg/dL   eGFR 69 >40 fO/fpw/8.26   BUN/Creatinine Ratio 19 12 - 28   Sodium 139 134 - 144 mmol/L   Potassium 4.6 3.5 - 5.2 mmol/L   Chloride 103 96 - 106 mmol/L   CO2 23 20 - 29 mmol/L   Calcium  10.0 8.7 - 10.3 mg/dL  CBC with Differential/Platelet   Collection Time: 08/21/23 11:03 AM  Result Value Ref Range  WBC 7.0 3.4 - 10.8 x10E3/uL   RBC 4.57 3.77 - 5.28 x10E6/uL   Hemoglobin 14.5 11.1 - 15.9 g/dL   Hematocrit 55.0 65.9 - 46.6 %   MCV 98 (H) 79 - 97 fL   MCH 31.7 26.6 - 33.0 pg   MCHC 32.3 31.5 - 35.7 g/dL   RDW 86.8 88.2 - 84.5 %   Platelets 264 150 - 450 x10E3/uL   Neutrophils 60 Not Estab. %   Lymphs 29 Not Estab. %   Monocytes 9 Not Estab. %   Eos 1 Not Estab. %   Basos 0 Not Estab. %   Neutrophils Absolute 4.3 1.4 - 7.0 x10E3/uL   Lymphocytes Absolute 2.0 0.7 - 3.1 x10E3/uL   Monocytes Absolute 0.6 0.1 - 0.9 x10E3/uL   EOS (ABSOLUTE) 0.1 0.0 - 0.4 x10E3/uL   Basophils Absolute 0.0 0.0 - 0.2 x10E3/uL   Immature Granulocytes 0 Not Estab. %   Immature Grans (Abs) 0.0 0.0 - 0.1 x10E3/uL  VITAMIN D  25 Hydroxy (Vit-D Deficiency, Fractures)   Collection Time: 08/21/23 11:03 AM  Result Value Ref Range   Vit D, 25-Hydroxy 53.7 30.0 - 100.0 ng/mL       Pertinent labs & imaging  results that were available during my care of the patient were reviewed by me and considered in my medical decision making.  Assessment & Plan:  Nicole Aguirre  was seen today for headache, facial pain, cough and nasal congestion.  Diagnoses and all orders for this visit:  URI with cough and congestion -     amoxicillin -clavulanate (AUGMENTIN ) 875-125 MG tablet; Take 1 tablet by mouth 2 (two) times daily for 7 days.  Acute non-recurrent pansinusitis -     amoxicillin -clavulanate (AUGMENTIN ) 875-125 MG tablet; Take 1 tablet by mouth 2 (two) times daily for 7 days.     Acute sinusitis and upper respiratory infection Persistent symptoms of headache, nasal pain, ear pain, and neck pain despite doxycycline  treatment suggest an ineffective antibiotic regimen. - Prescribe Augmentin  (amoxicillin  with clavulanate). - Advise to complete the full course of Augmentin . - Continue Mucinex , Nasonex, Allegra  (until switching to Zyrtec ), Singulair , and Flonase . - Encourage increased water intake.          Continue all other maintenance medications.  Follow up plan: Return if symptoms worsen or fail to improve.   Continue healthy lifestyle choices, including diet (rich in fruits, vegetables, and lean proteins, and low in salt and simple carbohydrates) and exercise (at least 30 minutes of moderate physical activity daily).  Educational handout given for sinus infection   The above assessment and management plan was discussed with the patient. The patient verbalized understanding of and has agreed to the management plan. Patient is aware to call the clinic if they develop any new symptoms or if symptoms persist or worsen. Patient is aware when to return to the clinic for a follow-up visit. Patient educated on when it is appropriate to go to the emergency department.   Nicole Bruns, FNP-C [facility] [phone]

## 2023-11-12 ENCOUNTER — Telehealth: Payer: Self-pay | Admitting: Family Medicine

## 2023-11-12 NOTE — Telephone Encounter (Signed)
 Pt informed that it is okay to take flu shot while on abx. LS

## 2023-11-12 NOTE — Telephone Encounter (Signed)
 Copied from CRM [id number]. Topic: Clinical - Medication Question >> Nov 12, 2023 10:06 AM Chiquita SQUIBB wrote: Reason for CRM: Patient states she is still taking an antibiotic and has a flu shot scheduled for tomorrow morning, patient is asking if she is okay to still get the flu shot or should she wait.

## 2023-11-13 ENCOUNTER — Ambulatory Visit: Payer: Self-pay

## 2023-11-13 ENCOUNTER — Ambulatory Visit

## 2023-11-13 ENCOUNTER — Other Ambulatory Visit: Payer: Self-pay | Admitting: Family Medicine

## 2023-11-13 ENCOUNTER — Other Ambulatory Visit: Payer: Self-pay

## 2023-11-13 DIAGNOSIS — J069 Acute upper respiratory infection, unspecified: Secondary | ICD-10-CM

## 2023-11-13 DIAGNOSIS — J0140 Acute pansinusitis, unspecified: Secondary | ICD-10-CM

## 2023-11-13 MED ORDER — AMOXICILLIN-POT CLAVULANATE 875-125 MG PO TABS: 1.0000 | ORAL_TABLET | Freq: Two times a day (BID) | ORAL | 0 refills | Status: AC

## 2023-11-13 MED ORDER — BENZONATATE 200 MG PO CAPS: 200.0000 mg | ORAL_CAPSULE | Freq: Two times a day (BID) | ORAL | 0 refills | Status: AC | PRN

## 2023-11-13 NOTE — Progress Notes (Signed)
Refilled per pcp

## 2023-11-13 NOTE — Addendum Note (Signed)
 Addended by: SEVERA ROCK HERO on: 11/13/2023 01:56 PM   Modules accepted: Orders

## 2023-11-13 NOTE — Telephone Encounter (Signed)
 Please see recent NT encounter regarding patient's symptoms.

## 2023-11-13 NOTE — Telephone Encounter (Unsigned)
 Copied from CRM #[id number]. Topic: Clinical - Medication Refill >> Nov 13, 2023  9:57 AM Joesph B wrote: Medication: benzonatate  (TESSALON ) 200 MG capsule amoxicillin -clavulanate (AUGMENTIN ) 875-125 MG tablet   Has the patient contacted their pharmacy? Yes (Agent: If no, request that the patient contact the pharmacy for the refill. If patient does not wish to contact the pharmacy document the reason why and proceed with request.) (Agent: If yes, when and what did the pharmacy advise?)  This is the patient's preferred pharmacy:  [id number] - [place], [place] - [address] [place] [address] Phone: [phone] Fax: [phone]  Is this the correct pharmacy for this prescription? Yes If no, delete pharmacy and type the correct one.   Has the prescription been filled recently? Yes  Is the patient out of the medication? Yes  Has the patient been seen for an appointment in the last year OR does the patient have an upcoming appointment? Yes  Can we respond through MyChart? Yes  Agent: Please be advised that Rx refills may take up to 3 business days. We ask that you follow-up with your pharmacy.

## 2023-11-13 NOTE — Telephone Encounter (Signed)
 FYI Only or Action Required?: Action required by provider: clinical question for provider.  Patient was last seen in primary care on 11/07/2023 by Severa Rock HERO, FNP.  Called Nurse Triage reporting Advice Only.  Symptoms began today.  Triage Disposition: Call PCP When Office is Open  Patient/caregiver understands and will follow disposition?: No, wishes to speak with PCP    Copied from CRM #[id number]. Topic: Clinical - Red Word Triage >> Nov 13, 2023 10:01 AM Nicole Aguirre wrote: Red Word that prompted transfer to Nurse Triage: Coughing, drainage, pain in her face and ears. Patient is on a antibiotic and she says she don't feel like she's getting better. Reason for Disposition  [1] Caller requesting NON-URGENT health information AND [2] PCP's office is the best resource  Answer Assessment - Initial Assessment Questions 1. REASON FOR CALL: What is the main reason for your call? or How can I best help you?    Pt called stating s/s from LOV have decreased somewhat however still having hurting bilateral ears, no longer pain in neck still hurting in nose & face & still having thick white drainage. Pt still feels awful.  Last of day of ABX  is today and wants to know if PCP recommends another round of 7 day ABX or what will be plan of care moving forward? Pt would also like to request refill on tessalon  pearls.  Protocols used: Information Only Call - No Triage-A-AH

## 2023-11-22 ENCOUNTER — Ambulatory Visit

## 2023-11-22 DIAGNOSIS — Z23 Encounter for immunization: Secondary | ICD-10-CM | POA: Diagnosis not present

## 2023-11-24 ENCOUNTER — Other Ambulatory Visit: Payer: Self-pay | Admitting: *Deleted

## 2023-11-24 DIAGNOSIS — E782 Mixed hyperlipidemia: Secondary | ICD-10-CM

## 2023-11-24 DIAGNOSIS — M797 Fibromyalgia: Secondary | ICD-10-CM

## 2023-12-04 ENCOUNTER — Telehealth: Payer: Self-pay

## 2023-12-04 NOTE — Telephone Encounter (Signed)
 Copied from CRM #[id number]. Topic: Clinical - Medication Question >> Dec 04, 2023  3:25 PM Donna BRAVO wrote: Reason for CRM: Patient said  husband takes gabapenton and there is a study about that came out about early onset dementia and his provider is are weaning him off of it. Patient would like to know if she needs to go off of gabepentin and try something else

## 2023-12-05 NOTE — Telephone Encounter (Signed)
 Patient aware and verbalizes understanding- states she does not want to at this time but she will call when she wants to.

## 2023-12-24 ENCOUNTER — Other Ambulatory Visit: Payer: Self-pay | Admitting: Family Medicine

## 2023-12-24 DIAGNOSIS — K582 Mixed irritable bowel syndrome: Secondary | ICD-10-CM

## 2023-12-25 ENCOUNTER — Other Ambulatory Visit: Payer: Self-pay | Admitting: *Deleted

## 2023-12-25 MED ORDER — FLUTICASONE PROPIONATE 50 MCG/ACT NA SUSP: 2.0000 | Freq: Every day | NASAL | 1 refills | Status: AC

## 2023-12-25 MED ORDER — LOSARTAN POTASSIUM 50 MG PO TABS: 50.0000 mg | ORAL_TABLET | Freq: Every day | ORAL | 2 refills | Status: DC

## 2023-12-25 NOTE — Addendum Note (Signed)
 Addended by: SEVERA ROCK HERO on: 12/25/2023 12:02 PM   Modules accepted: Orders

## 2024-01-01 NOTE — Progress Notes (Signed)
 Pharmacy Quality Measure Review  This patient is appearing on a report for being at risk of failing the adherence measure for hypertension (ACEi/ARB) medications this calendar year.   Medication: losartan  100 mg  Last fill date: 12/24/23 for 80 day supply  Insurance report was not up to date. No action needed at this time.   Jenkins Graces, PharmD PGY1 Pharmacy Resident [phone]

## 2024-01-11 ENCOUNTER — Ambulatory Visit: Payer: Self-pay

## 2024-01-11 NOTE — Telephone Encounter (Signed)
 FYI Only or Action Required?: Action required by provider: request for appointment.  Patient was last seen in primary care on 11/07/2023 by Severa Rock HERO, FNP.  Called Nurse Triage reporting Ear Fullness.  Symptoms began several days ago.  Interventions attempted: OTC medications: cordicin, mucinex .  Symptoms are: unchanged.  Triage Disposition: See Physician Within 24 Hours  Patient/caregiver understands and will follow disposition?: No, wishes to speak with PCP   Reason for Disposition  Earache lasts > 1 hour  Answer Assessment - Initial Assessment Questions No available appts today. Advised UC today and ED if symptoms worsen. Patient verbalized understanding.  Patient request for appt Monday morning.  1. LOCATION: Which ear is involved?       Both ears 2. SENSATION: Describe how the ear feels. (e.g., stuffy, full, plugged).      Stopped up pain 3. ONSET:  When did the ear symptoms start?       Monday 4. PAIN: Do you also have an earache? If Yes, ask: How bad is it? (Scale 0-10; none, mild, moderate or severe)     8/10, constant 5. CAUSE: What do you think is causing the ear congestion? (e.g., common cold, nasal allergies, recent flight, recent snorkeling)     Nasal drainage, cough; yellow, sinus pain/HA, allergies Cordicin, mucinex  6. OTHER SYMPTOMS: Do you have any other symptoms? (e.g., ear drainage, hay fever symptoms such as sneezing or a clear nasal discharge; cold symptoms such as a cough or runny nose)     Denies dizziness, diff breathing, chest pain, fever chills n/v  Protocols used: Ear - Congestion-A-AH Message from [facility] G sent at 01/11/2024 11:13 AM EST  Reason for Triage: Head cold, draining, stopped up, eyes are sore, ears hurt, headache.SABRA

## 2024-01-11 NOTE — Telephone Encounter (Signed)
SCHEDULED APPT FOR MONDAY

## 2024-01-14 ENCOUNTER — Ambulatory Visit (INDEPENDENT_AMBULATORY_CARE_PROVIDER_SITE_OTHER): Admitting: Family Medicine

## 2024-01-14 ENCOUNTER — Ambulatory Visit: Payer: Self-pay | Admitting: Family Medicine

## 2024-01-14 ENCOUNTER — Encounter: Payer: Self-pay | Admitting: Family Medicine

## 2024-01-14 VITALS — BP 179/80 | HR 65 | Temp 97.7°F | Ht 59.0 in | Wt 151.0 lb

## 2024-01-14 DIAGNOSIS — J019 Acute sinusitis, unspecified: Secondary | ICD-10-CM | POA: Diagnosis not present

## 2024-01-14 DIAGNOSIS — B9689 Other specified bacterial agents as the cause of diseases classified elsewhere: Secondary | ICD-10-CM

## 2024-01-14 LAB — VERITOR SARS-COV-2 AND FLU A+B
BD Veritor SARS-CoV-2 Ag: NEGATIVE
Influenza A: NEGATIVE
Influenza B: NEGATIVE

## 2024-01-14 MED ORDER — AMOXICILLIN-POT CLAVULANATE 875-125 MG PO TABS: 1.0000 | ORAL_TABLET | Freq: Two times a day (BID) | ORAL | 0 refills | Status: AC

## 2024-01-14 NOTE — Progress Notes (Signed)
 "  Subjective: CC: Sinusitis PCP: Severa Rock HERO, FNP HPI:Nicole  Aguirre is a 73 y.o. female presenting to clinic today for:  Patient reports that she became ill about 1 week ago with chills and bodyaches.  She was having copious amounts of drainage but it only recently did start turning into yellow clumpy drainage.  She reports mild cough but denies any production.  No hemoptysis.  She is developed a few sores in her nose and she attributes this to the copious amounts of drainage but is wondering if maybe she has a bacterial infection as well.  Compliant with home Singulair , Flonase  and has added Coricidin and Mucinex  with little improvement in symptoms.  No shortness of breath or wheezing.    ROS: Per HPI  Allergies[1] Past Medical History:  Diagnosis Date   Allergy    Fibromyalgia    GERD (gastroesophageal reflux disease)    Hyperlipidemia    Hypertension    PVC (premature ventricular contraction)    Current Medications[2] Social History   Socioeconomic History   Marital status: Married    Spouse name: Oneil   Number of children: 2   Years of education: 10th   Highest education level: GED or equivalent  Occupational History   Occupation: Dietary    Comment: Chief executive officer   Occupation: retired  Tobacco Use   Smoking status: Former    Current packs/day: 0.00    Average packs/day: 1 pack/day for 4.0 years (4.0 ttl pk-yrs)    Types: Cigarettes    Start date: 07/25/1972    Quit date: 07/25/1976    Years since quitting: 47.5   Smokeless tobacco: Never  Vaping Use   Vaping status: Never Used  Substance and Sexual Activity   Alcohol use: Never   Drug use: Never   Sexual activity: Yes    Birth control/protection: Surgical  Other Topics Concern   Not on file  Social History Narrative   2 sons   5 grandchildren.   Social Drivers of Health   Tobacco Use: Medium Risk (11/07/2023)   Patient History    Smoking Tobacco Use: Former    Smokeless Tobacco Use: Never     Passive Exposure: Not on file  Financial Resource Strain: Low Risk (04/05/2023)   Overall Financial Resource Strain (CARDIA)    Difficulty of Paying Living Expenses: Not hard at all  Food Insecurity: No Food Insecurity (04/05/2023)   Hunger Vital Sign    Worried About Running Out of Food in the Last Year: Never true    Ran Out of Food in the Last Year: Never true  Transportation Needs: No Transportation Needs (04/05/2023)   PRAPARE - Administrator, Civil Service (Medical): No    Lack of Transportation (Non-Medical): No  Physical Activity: Sufficiently Active (04/05/2023)   Exercise Vital Sign    Days of Exercise per Week: 7 days    Minutes of Exercise per Session: 30 min  Stress: No Stress Concern Present (04/05/2023)   [employer] of Occupational Health - Occupational Stress Questionnaire    Feeling of Stress : Not at all  Social Connections: Moderately Isolated (04/05/2023)   Social Connection and Isolation Panel    Frequency of Communication with Friends and Family: Once a week    Frequency of Social Gatherings with Friends and Family: Once a week    Attends Religious Services: 1 to 4 times per year    Active Member of [employer] or Organizations: Yes    Attends Banker  Meetings: Never    Marital Status: Widowed  Intimate Partner Violence: Not At Risk (04/05/2023)   Humiliation, Afraid, Rape, and Kick questionnaire    Fear of Current or Ex-Partner: No    Emotionally Abused: No    Physically Abused: No    Sexually Abused: No  Depression (PHQ2-9): Low Risk (11/07/2023)   Depression (PHQ2-9)    PHQ-2 Score: 0  Alcohol Screen: Low Risk (04/05/2023)   Alcohol Screen    Last Alcohol Screening Score (AUDIT): 0  Housing: Unknown (04/05/2023)   Housing Stability Vital Sign    Unable to Pay for Housing in the Last Year: No    Number of Times Moved in the Last Year: Not on file    Homeless in the Last Year: No  Utilities: Not At Risk (04/05/2023)   AHC Utilities     Threatened with loss of utilities: No  Health Literacy: Adequate Health Literacy (04/05/2023)   B1300 Health Literacy    Frequency of need for help with medical instructions: Never   Family History  Problem Relation Age of Onset   Lupus Mother    Rheum arthritis Mother    Lung cancer Mother    Emphysema Father    Pancreatic cancer Father    Lupus Sister    Lupus Sister    Lung disease Sister    Heart disease Brother    Early death Brother        Died from falling from a tree   Heart disease Son    Mental illness Son    Breast cancer Neg Hx     Objective: Office vital signs reviewed. BP (!) 179/80   Pulse 65   Temp 97.7 F (36.5 C)   Ht 4' 11 (1.499 m)   Wt 151 lb (68.5 kg)   SpO2 98%   BMI 30.50 kg/m   Physical Examination:  General: Awake, alert, nontoxic female, No acute distress HEENT: Normal    Neck: No masses palpated. No lymphadenopathy    Ears: Tympanic membranes intact, normal light reflex, no erythema, no bulging    Eyes: PERRLA, extraocular membranes intact, sclera white    Nose: nasal turbinates moist, opaque nasal discharge    Throat: moist mucus membranes, no erythema, no tonsillar exudate.  Airway is patent Cardio: regular rate and rhythm, S1S2 heard, no murmurs appreciated Pulm: clear to auscultation bilaterally, no wheezes, rhonchi or rales; normal work of breathing on room air  Assessment/ Plan: 73 y.o. female   Acute bacterial rhinosinusitis - Plan: Veritor SARS-CoV-2 and Flu A+B, amoxicillin -clavulanate (AUGMENTIN ) 875-125 MG tablet   Rapid flu, COVID though I suspect this is likely a secondary bacterial infection superimposed on what may have been influenza.  Home care instructions reviewed and reasons for reevaluation discussed.  Antibiotic sent.  Follow-up as needed   [name], DO [facility] [phone]     [1]  Allergies Allergen Reactions   Hydrochlorothiazide  Other (See Comments)     cramping  cramping cramping    cramping  cramping    Other Reaction(s): Other (See Comments), Other (See Comments)    cramping cramping  cramping    cramping  cramping cramping  cramping  [2]  Current Outpatient Medications:    albuterol  (VENTOLIN  HFA) 108 (90 Base) MCG/ACT inhaler, Inhale 2 puffs into the lungs every 6 (six) hours as needed., Disp: 18 g, Rfl: 2   atorvastatin  (LIPITOR) 40 MG tablet, TAKE 1 TABLET BY MOUTH AT  BEDTIME, Disp: 100  tablet, Rfl: 0   benzonatate  (TESSALON ) 200 MG capsule, Take 1 capsule (200 mg total) by mouth 2 (two) times daily as needed for cough., Disp: 20 capsule, Rfl: 0   cetirizine  (ZYRTEC ) 10 MG tablet, Take 1 tablet (10 mg total) by mouth daily., Disp: 30 tablet, Rfl: 11   diclofenac  Sodium (VOLTAREN ) 1 % GEL, APPLY 2 GRAMS TO AFFECTED AREA 4 TIMES A DAY, Disp: 200 g, Rfl: 0   fluticasone  (FLONASE ) 50 MCG/ACT nasal spray, Place 2 sprays into both nostrils daily., Disp: 48 g, Rfl: 1   gabapentin  (NEURONTIN ) 300 MG capsule, TAKE 1 CAPSULE BY MOUTH 3 TIMES  DAILY, Disp: 300 capsule, Rfl: 0   hyoscyamine  (LEVSIN ) 0.125 MG tablet, Take 1 tablet (0.125 mg total) by mouth every 4 (four) hours as needed for cramping., Disp: 30 tablet, Rfl: 1   loperamide  (IMODIUM ) 2 MG capsule, TAKE 1 CAPSULE BY MOUTH 4 TIMES  DAILY AS NEEDED FOR DIARRHEA OR  LOOSE STOOLS, Disp: 90 capsule, Rfl: 0   losartan  (COZAAR ) 50 MG tablet, Take 1 tablet (50 mg total) by mouth daily. Dr. Braden w/[facility] Once daily, Disp: 90 tablet, Rfl: 2   Magnesium  200 MG TABS, Take 1 tablet (200 mg total) by mouth daily., Disp: 90 tablet, Rfl: 1   metoprolol  succinate (TOPROL -XL) 25 MG 24 hr tablet, TAKE 1/2 TABLET BY MOUTH DAILY, Disp: 45 tablet, Rfl: 1   montelukast  (SINGULAIR ) 10 MG tablet, TAKE 1 TABLET BY MOUTH DAILY, Disp: 100 tablet, Rfl: 1   omeprazole  (PRILOSEC) 40 MG capsule, TAKE 1 CAPSULE BY MOUTH DAILY, Disp: 100 capsule, Rfl: 2   ondansetron  (ZOFRAN ) 4 MG tablet, TAKE 1 TABLET BY  MOUTH EVERY 8  HOURS AS NEEDED FOR NAUSEA AND  VOMITING, Disp: 20 tablet, Rfl: 0   Vitamin D , Ergocalciferol , (DRISDOL ) 1.25 MG (50000 UNIT) CAPS capsule, TAKE 1 CAPSULE BY MOUTH EVERY 7  DAYS, Disp: 15 capsule, Rfl: 0  "

## 2024-01-15 ENCOUNTER — Telehealth: Payer: Self-pay

## 2024-01-15 ENCOUNTER — Ambulatory Visit: Payer: Self-pay

## 2024-01-15 NOTE — Telephone Encounter (Signed)
 FYI Only or Action Required?: FYI only for provider: ED advised.  Patient was last seen in primary care on 01/14/2024 by Nicole Norene HERO, DO.  Called Nurse Triage reporting Hypertension.  Symptoms began several days ago.  Interventions attempted: Prescription medications: extra metoprolol .  Symptoms are: unchanged.  Triage Disposition: Go to ED Now (Notify PCP)  Patient/caregiver understands and will follow disposition?: No, wishes to speak with PCP  Copied from CRM [id number]. Topic: Clinical - Red Word Triage >> Jan 15, 2024  2:53 PM Nicole Aguirre wrote: Red Word that prompted transfer to Nurse Triage: Patient was just in yesterday but states her BP is still running high, this morning was 190/?, and then throughout today has been 179/91, 164/88, 157/82. Patient states she has also been having headaches. Spoke to her heart Dr today and they requested she schedule to see her PCP to see about additional medication. Reason for Disposition  [1] Systolic BP >= 160 OR Diastolic >= 100 AND [2] cardiac (e.g., breathing difficulty, chest pain) or neurologic symptoms (e.g., new-onset blurred or double vision, unsteady gait)  Answer Assessment - Initial Assessment Questions Pt states her blood pressure has been high. It was high yesterday when at the office for an appt. She states she called her cardiologist who can't get her in until February and advised that she call PCP to schedule to discuss. This morning was she 164/88 she states that is on the lower side of what she has been running because she took a whole metoprolol  instead of a half. She is taking 100mg  of losartan . She states her vision has been blurry for a couple of days and had a headache. She states she has done research and internet says blurry vision can be caused by sinus infection.  RN advised with BP over 160 and symptoms recommendations are for her to go to the ER. She states she is sick with a sinus infection and bronchitis and she  is not going anywhere if she doesn't have to. RN again advised due to the blurry vision and headache along with the elevated BP that is cause for concern and would recommend the ER. Pt is refusing. Rn advised would send a message to the office to inform them and see what they would like for her to do.     1. BLOOD PRESSURE: What is your blood pressure? Did you take at least two measurements 5 minutes apart?     164/88 2. ONSET: When did you take your blood pressure?     This morning 3. HOW: How did you take your blood pressure? (e.g., automatic home BP monitor, visiting nurse)     Automatic cuff 4. HISTORY: Do you have a history of high blood pressure?     yes 5. MEDICINES: Are you taking any medicines for blood pressure? Have you missed any doses recently?     Taking all medications  6. OTHER SYMPTOMS: Do you have any symptoms? (e.g., blurred vision, chest pain, difficulty breathing, headache, weakness)     Blurry vision and headache  Protocols used: Blood Pressure - High-A-AH

## 2024-01-15 NOTE — Telephone Encounter (Signed)
 Patient scheduled 01/18/2024 with PCP. Refused visit to ED.

## 2024-01-15 NOTE — Telephone Encounter (Signed)
 Copied from CRM [id number]. Topic: General - Other >> Jan 15, 2024  3:18 PM Rachelle R wrote: Reason for CRM: Patient was speaking with Vinie and call was disconnected. Patient is requesting a callback.  Can be reached at [phone]

## 2024-01-16 ENCOUNTER — Ambulatory Visit (INDEPENDENT_AMBULATORY_CARE_PROVIDER_SITE_OTHER): Admitting: Family Medicine

## 2024-01-16 VITALS — BP 137/84 | HR 63 | Temp 98.2°F | Ht 59.0 in | Wt 150.0 lb

## 2024-01-16 DIAGNOSIS — I10 Essential (primary) hypertension: Secondary | ICD-10-CM | POA: Diagnosis not present

## 2024-01-16 MED ORDER — AMLODIPINE BESYLATE 5 MG PO TABS: 5.0000 mg | ORAL_TABLET | Freq: Every day | ORAL | 0 refills | Status: DC

## 2024-01-16 MED ORDER — AMLODIPINE BESYLATE 2.5 MG PO TABS: 2.5000 mg | ORAL_TABLET | Freq: Every day | ORAL | 0 refills | Status: DC

## 2024-01-16 NOTE — Telephone Encounter (Signed)
 Patient states that she has an appointment today.

## 2024-01-16 NOTE — Progress Notes (Signed)
 "  Acute Office Visit  Patient ID: Nicole  Aguirre, female    DOB: April 21, 1950, 73 y.o.   MRN: [id number]  PCP: Severa Rock HERO, FNP  Chief Complaint  Patient presents with   Hypertension    Patient reports having high blood pressure for 1-2 months. Reports some blurred vision, off and on. Patient says she also has a sinus infection and is currently on amoxicillin  which she says could be why she has had some blurred vision.   Patient says she has been taking 12.5mg  of Metoprolol  but for the past 2 nights she has been taking 25mg  of Metoprolol .     Subjective:     Hypertension    Discussed the use of AI scribe software for clinical note transcription with the patient, who gave verbal consent to proceed.  History of Present Illness   Nicole  Aguirre is a 73 year old female with hypertension who presents with elevated blood pressure readings.  Hypertension and blood pressure monitoring - Elevated blood pressure for approximately two months - Recent home blood pressure reading of 185/97 on Monday - Monitors blood pressure at home regularly and has been running 160-190 systolic.   Antihypertensive medication management and side effects - Currently taking losartan  (Cozaar ) 100 mg daily for the past 6-8 months, increased from 50 mg due to elevated blood pressure. - Metoprolol  recently increased from 12.5 mg to 25 mg two days ago to improve blood pressure control - Expresses concern about metoprolol  lowering heart rate excessively, as experienced previously which is why it was lowered to 12.5 mg per patient - Previously treated with hydrochlorothiazide  but discontinued due to muscle cramping  Cardiac arrhythmia symptoms - History of premature ventricular contractions (PVCs) - Treated with metoprolol  - Followed by Dr. Jacqualyn cardiology. Reports not being able to see cardiology again until Feb.   Neurological and visual symptoms - No current dizziness - Blurry vision has  improved, attributed to improvement of recent sinus infection  Recent infectious symptoms - Seen in office on 01/14/24, prescribed Augmentin  for sinus infection.       ROS     Objective:    BP 137/84   Pulse 63   Temp 98.2 F (36.8 C)   Ht 4' 11 (1.499 m)   Wt 150 lb (68 kg)   SpO2 98%   BMI 30.30 kg/m    Physical Exam Vitals reviewed.  Constitutional:      Appearance: Normal appearance.  HENT:     Head: Normocephalic and atraumatic.  Eyes:     Extraocular Movements: Extraocular movements intact.     Conjunctiva/sclera: Conjunctivae normal.     Pupils: Pupils are equal, round, and reactive to light.  Cardiovascular:     Rate and Rhythm: Normal rate and regular rhythm.     Pulses: Normal pulses.     Heart sounds: Normal heart sounds. No murmur heard. Pulmonary:     Effort: Pulmonary effort is normal. No respiratory distress.     Breath sounds: Normal breath sounds.  Musculoskeletal:        General: Normal range of motion.     Cervical back: Normal range of motion.  Skin:    General: Skin is warm and dry.  Neurological:     General: No focal deficit present.     Mental Status: She is alert and oriented to person, place, and time.  Psychiatric:        Mood and Affect: Mood normal.        Behavior: Behavior  normal.       No results found for any visits on 01/16/24.     Assessment & Plan:   Problem List Items Addressed This Visit   None Visit Diagnoses       Hypertension, unspecified type    -  Primary   Relevant Medications   losartan  (COZAAR ) 100 MG tablet   amLODipine  (NORVASC ) 5 MG tablet       Assessment and Plan    Essential hypertension - Lowest BP obtained in office today is 137/84 on 100 mg losartan  and 25 mg metoprolol . Patient wants to lower metoprolol  to 12.5 mg due to bradycardia concerns but is concerned BP will be hypertensive again.  - Continue losartan  100 mg daily. - Reduce metoprolol  to 12.5 mg daily due to concerns for  bradycardia.  - Initiate amlodipine  5 mg/day.  - Monitor blood pressure at home. - Follow-up next week with PCP for reevaluation or sooner if BP > 180/100.        Meds ordered this encounter  Medications   DISCONTD: amLODipine  (NORVASC ) 2.5 MG tablet    Sig: Take 1 tablet (2.5 mg total) by mouth daily.    Dispense:  30 tablet    Refill:  0    Supervising Provider:   GOTTSCHALK, ASHLY M [[id number]]   amLODipine  (NORVASC ) 5 MG tablet    Sig: Take 1 tablet (5 mg total) by mouth daily.    Dispense:  30 tablet    Refill:  0    Supervising Provider:   JOLINDA NORENE HERO [[id number]]    Return in about 1 week (around 01/23/2024) for Reevaluate blood pressure. SABRA Oneil LELON Alcus, FNP [facility]   "

## 2024-01-18 ENCOUNTER — Ambulatory Visit: Admitting: Family Medicine

## 2024-01-22 ENCOUNTER — Ambulatory Visit

## 2024-01-30 ENCOUNTER — Other Ambulatory Visit: Payer: Self-pay | Admitting: Family Medicine

## 2024-01-30 DIAGNOSIS — E559 Vitamin D deficiency, unspecified: Secondary | ICD-10-CM

## 2024-02-07 ENCOUNTER — Other Ambulatory Visit: Payer: Self-pay | Admitting: Family Medicine

## 2024-02-07 DIAGNOSIS — I10 Essential (primary) hypertension: Secondary | ICD-10-CM

## 2024-02-11 ENCOUNTER — Other Ambulatory Visit: Payer: Self-pay | Admitting: Family Medicine

## 2024-02-11 DIAGNOSIS — I10 Essential (primary) hypertension: Secondary | ICD-10-CM

## 2024-02-21 ENCOUNTER — Encounter: Payer: Self-pay | Admitting: Family Medicine

## 2024-07-15 ENCOUNTER — Encounter: Admitting: Family Medicine
